# Patient Record
Sex: Female | Born: 1985 | Race: White | Hispanic: No | Marital: Single | Smoking: Former smoker
Health system: Southern US, Community
[De-identification: ages and names within clinical notes are randomized; demographics above are authoritative.]

## PROBLEM LIST (undated history)

## (undated) ENCOUNTER — Inpatient Hospital Stay (HOSPITAL_COMMUNITY): Payer: Self-pay

## (undated) DIAGNOSIS — I340 Nonrheumatic mitral (valve) insufficiency: Secondary | ICD-10-CM

## (undated) DIAGNOSIS — F329 Major depressive disorder, single episode, unspecified: Secondary | ICD-10-CM

## (undated) DIAGNOSIS — J45909 Unspecified asthma, uncomplicated: Secondary | ICD-10-CM

## (undated) DIAGNOSIS — F32A Depression, unspecified: Secondary | ICD-10-CM

## (undated) DIAGNOSIS — R001 Bradycardia, unspecified: Secondary | ICD-10-CM

## (undated) DIAGNOSIS — B029 Zoster without complications: Secondary | ICD-10-CM

## (undated) DIAGNOSIS — O24419 Gestational diabetes mellitus in pregnancy, unspecified control: Secondary | ICD-10-CM

## (undated) DIAGNOSIS — R42 Dizziness and giddiness: Secondary | ICD-10-CM

## (undated) DIAGNOSIS — F419 Anxiety disorder, unspecified: Secondary | ICD-10-CM

## (undated) DIAGNOSIS — F909 Attention-deficit hyperactivity disorder, unspecified type: Secondary | ICD-10-CM

## (undated) DIAGNOSIS — E119 Type 2 diabetes mellitus without complications: Secondary | ICD-10-CM

## (undated) HISTORY — PX: APPENDECTOMY: SHX54

## (undated) HISTORY — DX: Depression, unspecified: F32.A

## (undated) HISTORY — DX: Major depressive disorder, single episode, unspecified: F32.9

## (undated) HISTORY — DX: Bradycardia, unspecified: R00.1

## (undated) HISTORY — PX: FOOT SURGERY: SHX648

## (undated) HISTORY — DX: Dizziness and giddiness: R42

## (undated) HISTORY — DX: Anxiety disorder, unspecified: F41.9

## (undated) HISTORY — DX: Attention-deficit hyperactivity disorder, unspecified type: F90.9

---
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
## 2005-06-22 ENCOUNTER — Emergency Department (HOSPITAL_COMMUNITY): Admission: EM | Admit: 2005-06-22 | Discharge: 2005-06-22 | Payer: Self-pay | Admitting: Family Medicine

## 2005-06-24 ENCOUNTER — Emergency Department (HOSPITAL_COMMUNITY): Admission: EM | Admit: 2005-06-24 | Discharge: 2005-06-24 | Payer: Self-pay | Admitting: Family Medicine

## 2005-10-12 ENCOUNTER — Emergency Department (HOSPITAL_COMMUNITY): Admission: EM | Admit: 2005-10-12 | Discharge: 2005-10-12 | Payer: Self-pay | Admitting: Family Medicine

## 2006-01-04 ENCOUNTER — Emergency Department (HOSPITAL_COMMUNITY): Admission: EM | Admit: 2006-01-04 | Discharge: 2006-01-04 | Payer: Self-pay | Admitting: Family Medicine

## 2006-02-27 ENCOUNTER — Emergency Department (HOSPITAL_COMMUNITY): Admission: EM | Admit: 2006-02-27 | Discharge: 2006-02-27 | Payer: Self-pay | Admitting: Emergency Medicine

## 2006-03-11 ENCOUNTER — Emergency Department (HOSPITAL_COMMUNITY): Admission: EM | Admit: 2006-03-11 | Discharge: 2006-03-11 | Payer: Self-pay | Admitting: Emergency Medicine

## 2006-04-17 ENCOUNTER — Emergency Department (HOSPITAL_COMMUNITY): Admission: EM | Admit: 2006-04-17 | Discharge: 2006-04-17 | Payer: Self-pay | Admitting: Emergency Medicine

## 2006-06-15 ENCOUNTER — Inpatient Hospital Stay (HOSPITAL_COMMUNITY): Admission: AD | Admit: 2006-06-15 | Discharge: 2006-06-16 | Payer: Self-pay | Admitting: Obstetrics & Gynecology

## 2006-06-19 ENCOUNTER — Inpatient Hospital Stay (HOSPITAL_COMMUNITY): Admission: AD | Admit: 2006-06-19 | Discharge: 2006-06-19 | Payer: Self-pay | Admitting: Obstetrics and Gynecology

## 2006-10-28 ENCOUNTER — Inpatient Hospital Stay (HOSPITAL_COMMUNITY): Admission: AD | Admit: 2006-10-28 | Discharge: 2006-10-28 | Payer: Self-pay | Admitting: Obstetrics and Gynecology

## 2006-11-27 ENCOUNTER — Encounter: Admission: RE | Admit: 2006-11-27 | Discharge: 2006-11-27 | Payer: Self-pay | Admitting: Obstetrics and Gynecology

## 2007-01-14 ENCOUNTER — Inpatient Hospital Stay (HOSPITAL_COMMUNITY): Admission: AD | Admit: 2007-01-14 | Discharge: 2007-01-18 | Payer: Self-pay | Admitting: Obstetrics and Gynecology

## 2007-01-22 ENCOUNTER — Inpatient Hospital Stay (HOSPITAL_COMMUNITY): Admission: AD | Admit: 2007-01-22 | Discharge: 2007-01-24 | Payer: Self-pay | Admitting: Obstetrics and Gynecology

## 2007-01-25 ENCOUNTER — Ambulatory Visit (HOSPITAL_COMMUNITY): Admission: RE | Admit: 2007-01-25 | Discharge: 2007-01-25 | Payer: Self-pay | Admitting: Obstetrics and Gynecology

## 2007-02-16 ENCOUNTER — Inpatient Hospital Stay (HOSPITAL_COMMUNITY): Admission: AD | Admit: 2007-02-16 | Discharge: 2007-02-16 | Payer: Self-pay | Admitting: Obstetrics and Gynecology

## 2007-03-21 ENCOUNTER — Inpatient Hospital Stay (HOSPITAL_COMMUNITY): Admission: AD | Admit: 2007-03-21 | Discharge: 2007-03-21 | Payer: Self-pay | Admitting: Obstetrics and Gynecology

## 2007-11-19 ENCOUNTER — Inpatient Hospital Stay (HOSPITAL_COMMUNITY): Admission: AD | Admit: 2007-11-19 | Discharge: 2007-11-19 | Payer: Self-pay | Admitting: Obstetrics and Gynecology

## 2007-12-19 ENCOUNTER — Emergency Department (HOSPITAL_COMMUNITY): Admission: EM | Admit: 2007-12-19 | Discharge: 2007-12-19 | Payer: Self-pay | Admitting: Emergency Medicine

## 2008-01-29 ENCOUNTER — Emergency Department (HOSPITAL_COMMUNITY): Admission: EM | Admit: 2008-01-29 | Discharge: 2008-01-29 | Payer: Self-pay | Admitting: Family Medicine

## 2008-03-27 ENCOUNTER — Inpatient Hospital Stay (HOSPITAL_COMMUNITY): Admission: AD | Admit: 2008-03-27 | Discharge: 2008-03-27 | Payer: Self-pay | Admitting: Family Medicine

## 2008-04-01 ENCOUNTER — Emergency Department (HOSPITAL_COMMUNITY): Admission: EM | Admit: 2008-04-01 | Discharge: 2008-04-01 | Payer: Self-pay | Admitting: Emergency Medicine

## 2008-07-11 ENCOUNTER — Emergency Department (HOSPITAL_COMMUNITY): Admission: EM | Admit: 2008-07-11 | Discharge: 2008-07-11 | Payer: Self-pay | Admitting: Emergency Medicine

## 2009-02-19 ENCOUNTER — Emergency Department (HOSPITAL_COMMUNITY): Admission: EM | Admit: 2009-02-19 | Discharge: 2009-02-19 | Payer: Self-pay | Admitting: Emergency Medicine

## 2010-02-06 ENCOUNTER — Emergency Department (HOSPITAL_COMMUNITY): Admission: EM | Admit: 2010-02-06 | Discharge: 2010-02-06 | Payer: Self-pay | Admitting: Emergency Medicine

## 2010-05-04 ENCOUNTER — Emergency Department (HOSPITAL_COMMUNITY)
Admission: EM | Admit: 2010-05-04 | Discharge: 2010-05-04 | Payer: Self-pay | Source: Home / Self Care | Admitting: Emergency Medicine

## 2010-08-18 LAB — BASIC METABOLIC PANEL
CO2: 26 mEq/L (ref 19–32)
Chloride: 104 mEq/L (ref 96–112)
GFR calc Af Amer: >60 mL/min (ref >60)
Potassium: 3.6 mEq/L (ref 3.5–5.1)

## 2010-08-18 LAB — URINALYSIS, ROUTINE W REFLEX MICROSCOPIC
Glucose, UA: NEGATIVE mg/dL
Hgb urine dipstick: NEGATIVE
Ketones, ur: NEGATIVE mg/dL
Protein, ur: NEGATIVE mg/dL
Urobilinogen, UA: 0.2 mg/dL (ref 0.0–1.0)

## 2010-08-18 LAB — HEMOCCULT GUIAC POC 1CARD (OFFICE): Fecal Occult Bld: POSITIVE

## 2010-08-18 LAB — CBC
HCT: 42 % (ref 36.0–46.0)
Hemoglobin: 14.3 g/dL (ref 12.0–15.0)
MCV: 92.3 fL (ref 78.0–100.0)
RBC: 4.55 MIL/uL (ref 3.87–5.11)
WBC: 10.7 10*3/uL — ABNORMAL HIGH (ref 4.0–10.5)

## 2010-08-18 LAB — DIFFERENTIAL
Eosinophils Absolute: 0.1 10*3/uL (ref 0.0–0.7)
Eosinophils Relative: 1 % (ref 0–5)
Lymphs Abs: 2.6 10*3/uL (ref 0.7–4.0)
Monocytes Absolute: 0.3 10*3/uL (ref 0.1–1.0)
Monocytes Relative: 2 % — ABNORMAL LOW (ref 3–12)

## 2010-08-18 LAB — POCT PREGNANCY, URINE: Preg Test, Ur: NEGATIVE

## 2010-08-18 LAB — URINE MICROSCOPIC-ADD ON

## 2010-09-27 NOTE — Discharge Summary (Signed)
Jamie Palmer, Jamie Palmer              ACCOUNT NO.:  [id number]   MEDICAL RECORD NO.:  [id number]          PATIENT TYPE:  INP   LOCATION:  9306                          FACILITY:  [facility]   PHYSICIAN:  Guy Sandifer. Henderson Cloud, M.D. DATE OF BIRTH:  04-May-1986   DATE OF ADMISSION:  01/22/2007  DATE OF DISCHARGE:  01/24/2007                               DISCHARGE SUMMARY   ADMITTING DIAGNOSIS:  1. Postpartum pneumonia.  2. Possible urinary tract infection.   DISCHARGE DIAGNOSIS:  Postpartum pneumonia.   REASON FOR ADMISSION:  This patient is a 25 year old, 6 days out from a  vaginal delivery.  She is a smoker.  She presents to the office with  productive cough, wheezing, and shortness of breath.  During evaluation  x-rays were consistent with a lingular infiltrate consistent with  pneumonia.  She is admitted for further treatment.   HOSPITAL COURSE:  The patient was admitted to the hospital where she  received IV fluids, IV antibiotics, and albuterol respiratory  treatments.  On admission her white count 11.8 and hemoglobin is 11.4.  On January 24, 2007, white count is 9.1 and hemoglobin is 10.9.  her  O2 saturations are 97%.  She is feeling much better with much less  coughing; and is feeling good.  She wants to go home.   PHYSICAL EXAMINATION:  On examination she just has a single wheeze of  the left base which clears with deep inspiration.   CONDITION ON DISCHARGE:  Good.   DIET:  Regular as tolerated.   ACTIVITY:  No lifting, operation of automobiles, no vaginal entry.  She  is to call the office for problems including but not limited to  increasing shortness of breath, temperature of 101 degrees, persistent  nausea, vomiting, or increasing pain.   MEDICATIONS:  1. Zithromax pack.  2. Albuterol inhaler 2 puffs q.6 h.   FOLLOWUP:  A chest x-ray tomorrow morning followed by an office visit  tomorrow morning.      Guy Sandifer Henderson Cloud, M.D.  Electronically Signed    JET/MEDQ  D:   01/24/2007  T:  01/24/2007  Job:  [id number]

## 2010-09-27 NOTE — H&P (Signed)
Jamie Palmer, Jamie Palmer              ACCOUNT NO.:  [id number]   MEDICAL RECORD NO.:  [id number]          PATIENT TYPE:  INP   LOCATION:  9306                          FACILITY:  [facility]   PHYSICIAN:  Duke Salvia. Marcelle Overlie, M.D.DATE OF BIRTH:  06-30-1985   DATE OF ADMISSION:  01/22/2007  DATE OF DISCHARGE:                              HISTORY & PHYSICAL   CHIEF COMPLAINT:  Post partum, wheezing, upper respiratory infection.   HISTORY OF PRESENT ILLNESS:  A 25 year old who is post partum day 6 from  a vaginal delivery. She has a history of smoking. Called the office  earlier today saying she was having cough that was productive with some  wheezing, was not feeling well and presented to MAU for evaluation.   In MAU, chest x-ray was obtained. It showed a lingular infiltrate  consistent with pneumonia. Her exam showed she was having some deep lung  wheezing. No history of asthma. Did receive a nebulizer treatment in MAU  and was admitted for IV antibiotics.   PAST MEDICAL HISTORY:  ALLERGIES:  None.  PRIOR SURGERY:  Appendectomy.   FAMILY HISTORY:  Significant for a mother with lung cancer, father who  has nasal cancer and also a family history of hypertension.   PHYSICAL EXAMINATION:  Temperature 99, respirations 24, BP 120/78.  HEENT:  Unremarkable.  NECK:  Supple without masses.  LUNGS:  Reveal some wheezing at the bases.  CARDIOVASCULAR:  Regular rate and rhythm without murmurs, rubs, or  gallops noted.  ABDOMINAL EXAM:  Fundus was firm. Abdominal exam was otherwise  unremarkable.  PELVIC EXAM:  Deferred.   Chest x-ray consistent with lingular pneumonia. Urine also was showing  some possibility of UTI.   IMPRESSION:  1. Post-partum pneumonia.  2. Possible urinary tract infection.  3. History of smoking.   PLAN:  Will admit for nebulizer treatments, IV antibiotics pending  sputum and urine cultures.     Richard M. Marcelle Overlie, M.D.  Electronically Signed    RMH/MEDQ  D:   01/23/2007  T:  01/23/2007  Job:  [id number]

## 2010-10-20 ENCOUNTER — Inpatient Hospital Stay (HOSPITAL_COMMUNITY)
Admission: AD | Admit: 2010-10-20 | Discharge: 2010-10-20 | Disposition: A | Discharge status: Home / Self Care | Payer: Medicaid Other | Source: Ambulatory Visit | Attending: Obstetrics & Gynecology | Admitting: Obstetrics & Gynecology

## 2010-10-20 ENCOUNTER — Inpatient Hospital Stay (INDEPENDENT_AMBULATORY_CARE_PROVIDER_SITE_OTHER)
Admission: RE | Admit: 2010-10-20 | Discharge: 2010-10-20 | Disposition: A | Discharge status: Home / Self Care | Payer: Self-pay | Source: Ambulatory Visit | Attending: Emergency Medicine | Admitting: Emergency Medicine

## 2010-10-20 ENCOUNTER — Inpatient Hospital Stay (HOSPITAL_COMMUNITY): Payer: Medicaid Other

## 2010-10-20 DIAGNOSIS — R109 Unspecified abdominal pain: Secondary | ICD-10-CM | POA: Insufficient documentation

## 2010-10-20 DIAGNOSIS — N83209 Unspecified ovarian cyst, unspecified side: Secondary | ICD-10-CM

## 2010-10-20 DIAGNOSIS — N949 Unspecified condition associated with female genital organs and menstrual cycle: Secondary | ICD-10-CM

## 2010-10-20 LAB — CBC
Hemoglobin: 13.8 g/dL (ref 12.0–15.0)
MCHC: 34 g/dL (ref 30.0–36.0)
RDW: 13 % (ref 11.5–15.5)
WBC: 11.3 10*3/uL — ABNORMAL HIGH (ref 4.0–10.5)

## 2010-10-20 LAB — POCT URINALYSIS DIP (DEVICE)
Ketones, ur: NEGATIVE mg/dL
Protein, ur: NEGATIVE mg/dL
Specific Gravity, Urine: 1.025 (ref 1.005–1.030)

## 2010-10-20 LAB — POCT PREGNANCY, URINE: Preg Test, Ur: NEGATIVE

## 2010-10-20 LAB — WET PREP, GENITAL

## 2010-10-21 LAB — GC/CHLAMYDIA PROBE AMP, GENITAL
Chlamydia, DNA Probe: NEGATIVE
GC Probe Amp, Genital: NEGATIVE

## 2010-12-14 ENCOUNTER — Inpatient Hospital Stay (INDEPENDENT_AMBULATORY_CARE_PROVIDER_SITE_OTHER)
Admission: RE | Admit: 2010-12-14 | Discharge: 2010-12-14 | Disposition: A | Discharge status: Home / Self Care | Payer: Medicaid Other | Source: Ambulatory Visit | Attending: Family Medicine | Admitting: Family Medicine

## 2010-12-14 DIAGNOSIS — R112 Nausea with vomiting, unspecified: Secondary | ICD-10-CM

## 2010-12-14 LAB — POCT URINALYSIS DIP (DEVICE)
Ketones, ur: NEGATIVE mg/dL
Protein, ur: NEGATIVE mg/dL
Urobilinogen, UA: 2 mg/dL — ABNORMAL HIGH (ref 0.0–1.0)

## 2010-12-14 LAB — POCT PREGNANCY, URINE: Preg Test, Ur: NEGATIVE

## 2011-01-17 ENCOUNTER — Ambulatory Visit
Admission: RE | Admit: 2011-01-17 | Discharge: 2011-01-17 | Disposition: A | Discharge status: Home / Self Care | Payer: Medicaid Other | Source: Ambulatory Visit | Attending: Specialist | Admitting: Specialist

## 2011-01-17 ENCOUNTER — Other Ambulatory Visit: Payer: Self-pay | Admitting: Specialist

## 2011-01-17 DIAGNOSIS — M542 Cervicalgia: Secondary | ICD-10-CM

## 2011-01-17 DIAGNOSIS — M549 Dorsalgia, unspecified: Secondary | ICD-10-CM

## 2011-02-09 LAB — GC/CHLAMYDIA PROBE AMP, GENITAL: Chlamydia, DNA Probe: NEGATIVE

## 2011-02-09 LAB — URINALYSIS, ROUTINE W REFLEX MICROSCOPIC
Bilirubin Urine: NEGATIVE
Glucose, UA: NEGATIVE
Ketones, ur: NEGATIVE
Nitrite: NEGATIVE
Specific Gravity, Urine: 1.025
pH: 5.5

## 2011-02-09 LAB — WET PREP, GENITAL

## 2011-02-14 LAB — URINALYSIS, ROUTINE W REFLEX MICROSCOPIC
Bilirubin Urine: NEGATIVE
Glucose, UA: NEGATIVE
Nitrite: NEGATIVE
Specific Gravity, Urine: 1.015
pH: 7

## 2011-02-14 LAB — WET PREP, GENITAL: Yeast Wet Prep HPF POC: NONE SEEN

## 2011-02-14 LAB — GC/CHLAMYDIA PROBE AMP, GENITAL
Chlamydia, DNA Probe: NEGATIVE
GC Probe Amp, Genital: NEGATIVE

## 2011-02-21 LAB — URINALYSIS, ROUTINE W REFLEX MICROSCOPIC
Bilirubin Urine: NEGATIVE
Hgb urine dipstick: NEGATIVE
Specific Gravity, Urine: 1.02
pH: 6

## 2011-02-23 LAB — URINE CULTURE

## 2011-02-23 LAB — URINE MICROSCOPIC-ADD ON

## 2011-02-23 LAB — URINALYSIS, ROUTINE W REFLEX MICROSCOPIC
Glucose, UA: NEGATIVE
Specific Gravity, Urine: 1.025
pH: 5.5

## 2011-02-24 LAB — CBC
HCT: 33 — ABNORMAL LOW
HCT: 34.3 — ABNORMAL LOW
Hemoglobin: 14
MCHC: 35.2
MCV: 91.4
MCV: 91.4
MCV: 91.5
MCV: 92.1
Platelets: 220
Platelets: 299
Platelets: 306
RBC: 4.34
RDW: 13.9
RDW: 14
WBC: 18.6 — ABNORMAL HIGH
WBC: 9.1

## 2011-02-24 LAB — URINE MICROSCOPIC-ADD ON

## 2011-02-24 LAB — URINALYSIS, ROUTINE W REFLEX MICROSCOPIC
Glucose, UA: NEGATIVE
Specific Gravity, Urine: 1.01
pH: 6

## 2011-02-24 LAB — URINE CULTURE: Colony Count: 50000

## 2011-02-24 LAB — DIFFERENTIAL
Basophils Absolute: 0
Eosinophils Absolute: 0.1
Lymphocytes Relative: 20
Lymphs Abs: 1.8
Neutrophils Relative %: 72

## 2011-02-24 LAB — COMPREHENSIVE METABOLIC PANEL
Albumin: 2.7 — ABNORMAL LOW
BUN: 9
Creatinine, Ser: 0.66
Potassium: 3.9
Total Protein: 5.7 — ABNORMAL LOW

## 2011-02-24 LAB — CULTURE, RESPIRATORY W GRAM STAIN: Gram Stain: NONE SEEN

## 2011-03-01 LAB — URINALYSIS, ROUTINE W REFLEX MICROSCOPIC
Glucose, UA: 500 — AB
Nitrite: NEGATIVE
Protein, ur: NEGATIVE

## 2011-05-16 NOTE — L&D Delivery Note (Signed)
Delivery Note Pt progressed into active labor, went quickly at the end and pushed well.  At 5:25 PM a viable female was delivered via Vaginal, Spontaneous Delivery (Presentation: Right Occiput Anterior).  APGAR: 9, 9; weight pending.   Placenta status: Intact, Spontaneous.  Cord: 3 vessels with the following complications: None.  Anesthesia: Epidural  Episiotomy: None Lacerations: 1st degree Suture Repair: 3.0 vicryl rapide Est. Blood Loss (mL): 400  Mom to postpartum.  Baby to stay with mom.  Horrace Hanak D 03/29/2012, 5:45 PM

## 2011-05-17 ENCOUNTER — Emergency Department (HOSPITAL_COMMUNITY)
Admission: EM | Admit: 2011-05-17 | Discharge: 2011-05-17 | Disposition: A | Discharge status: Home / Self Care | Payer: Medicaid Other | Source: Home / Self Care

## 2011-05-17 DIAGNOSIS — N760 Acute vaginitis: Secondary | ICD-10-CM

## 2011-05-17 DIAGNOSIS — A6000 Herpesviral infection of urogenital system, unspecified: Secondary | ICD-10-CM

## 2011-05-17 LAB — WET PREP, GENITAL
Trich, Wet Prep: NONE SEEN
Yeast Wet Prep HPF POC: NONE SEEN

## 2011-05-17 MED ORDER — ACYCLOVIR 5 % EX OINT: TOPICAL_OINTMENT | CUTANEOUS | Status: DC

## 2011-05-17 MED ORDER — VALACYCLOVIR HCL 1 G PO TABS: 1000.0000 mg | ORAL_TABLET | Freq: Two times a day (BID) | ORAL | Status: AC

## 2011-05-17 NOTE — ED Notes (Signed)
Reportedly monogamous female who is in relationship w female works out of town a lot, has been having vaginal itching and yellow secretions for past couple of days; concerned about yeast infection; "feel like I ave been cut down there"

## 2011-05-17 NOTE — ED Provider Notes (Signed)
History     CSN: [id number]  Arrival date & time 05/17/11  1029   None     Chief Complaint  Patient presents with  . Vaginal Discharge    (Consider location/radiation/quality/duration/timing/severity/associated sxs/prior treatment) HPI Comments: Pt presents with c/o vaginal discharge and genital pain - onset 2 days ago. States feels very raw and sore at vaginal opening. No previous hx of similar genital lesions or prior dx of STD. She has been monogamous in her relationship of 7 yrs. Pt states she discontinued using Nuva Ring 2 mos ago trying to conceive. She has not tried anything for her symptoms. No pelvic pain or fever.    History reviewed. No pertinent past medical history.  Past Surgical History  Procedure Date  . Appendectomy     History reviewed. No pertinent family history.  History  Substance Use Topics  . Smoking status: Current Everyday Smoker  . Smokeless tobacco: Not on file  . Alcohol Use: No    OB History    Grav Para Term Preterm Abortions TAB SAB Ect Mult Living                  Review of Systems  Constitutional: Negative for fever and chills.  Genitourinary: Positive for vaginal discharge and vaginal pain. Negative for dysuria, frequency, vaginal bleeding and pelvic pain.    Allergies  Review of patient's allergies indicates no known allergies.  Home Medications   Current Outpatient Rx  Name Route Sig Dispense Refill  . ALPRAZOLAM 1 MG PO TABS Oral Take 1 mg by mouth at bedtime as needed.      . ACYCLOVIR 5 % EX OINT  Apply externally 4-5 times a day as needed 15 g 0  . CITALOPRAM HYDROBROMIDE 10 MG PO TABS Oral Take 10 mg by mouth daily.      Marland Kitchen VALACYCLOVIR HCL 1 G PO TABS Oral Take 1 tablet (1,000 mg total) by mouth 2 (two) times daily. 14 tablet 0    BP 114/56  Pulse 69  Temp(Src) 98.4 F (36.9 C) (Oral)  Resp 16  SpO2 100%  Physical Exam  Nursing note and vitals reviewed. Constitutional: She appears well-developed and  well-nourished. No distress.  Abdominal: Soft. She exhibits no mass. There is no tenderness.  Genitourinary: Uterus normal.    There is lesion on the right labia. There is no rash or tenderness on the right labia. There is no rash, tenderness or lesion on the left labia. Cervix exhibits no motion tenderness, no discharge and no friability. Right adnexum displays no mass, no tenderness and no fullness. Left adnexum displays no mass, no tenderness and no fullness. No erythema, tenderness or bleeding around the vagina. No foreign body around the vagina. No signs of injury around the vagina. Vaginal discharge found.  Neurological: She is alert.  Skin: Skin is warm and dry.  Psychiatric: She has a normal mood and affect.    ED Course  Procedures (including critical care time)   Labs Reviewed  GC/CHLAMYDIA PROBE AMP, GENITAL  WET PREP, GENITAL  HERPES SIMPLEX VIRUS CULTURE   No results found.   1. Genital herpes   2. Vaginitis       MDM  Shallow ulcers noted at vaginal introitus, typical appearance of HSV. Discussed dx and treatment with pt. Await vag cx. results.        Melody Comas, [place] 05/17/11 1342

## 2011-05-17 NOTE — Discharge Instructions (Signed)
You will be notified if your vaginal cultures are abnormal. Your herpes culture will take approximately one week for the results to be available. False negative results are possible, meaning that even if your culture is negative you may still have genital herpes. If your culture is negative I recommend you have a herpes blood test done by your primary care dr. No sexual activity until your sores have healed. I have prescribed an ointment you can apply externally to cover the area of the sores to help with your discomfort. Do not apply any other ointments or creams.  You may also take Tylenol or Ibuprofen for discomfort.

## 2011-05-18 LAB — GC/CHLAMYDIA PROBE AMP, GENITAL: Chlamydia, DNA Probe: NEGATIVE

## 2011-05-19 LAB — HERPES SIMPLEX VIRUS CULTURE

## 2011-05-20 NOTE — ED Provider Notes (Signed)
Medical screening examination/treatment/procedure(s) were performed by non-physician practitioner and as supervising physician I was immediately available for consultation/collaboration.  Ester Mabe M. MD   Danniel Grenz M Baby Stairs, MD 05/20/11 1826 

## 2011-08-31 LAB — OB RESULTS CONSOLE ABO/RH

## 2011-08-31 LAB — OB RESULTS CONSOLE GC/CHLAMYDIA
Chlamydia: NEGATIVE
Gonorrhea: NEGATIVE

## 2011-08-31 LAB — OB RESULTS CONSOLE HEPATITIS B SURFACE ANTIGEN: Hepatitis B Surface Ag: NEGATIVE

## 2011-08-31 LAB — OB RESULTS CONSOLE ANTIBODY SCREEN: Antibody Screen: NEGATIVE

## 2011-08-31 LAB — OB RESULTS CONSOLE RUBELLA ANTIBODY, IGM: Rubella: IMMUNE

## 2011-09-29 ENCOUNTER — Encounter (HOSPITAL_COMMUNITY): Payer: Self-pay | Admitting: Emergency Medicine

## 2011-09-29 ENCOUNTER — Emergency Department (HOSPITAL_COMMUNITY)
Admission: EM | Admit: 2011-09-29 | Discharge: 2011-09-29 | Disposition: A | Discharge status: Home / Self Care | Payer: Medicaid Other | Source: Home / Self Care | Attending: Family Medicine | Admitting: Family Medicine

## 2011-09-29 DIAGNOSIS — J309 Allergic rhinitis, unspecified: Secondary | ICD-10-CM

## 2011-09-29 DIAGNOSIS — J302 Other seasonal allergic rhinitis: Secondary | ICD-10-CM

## 2011-09-29 NOTE — Discharge Instructions (Signed)
Use benadryl, mucinex and lots of fluids, no more smoking, see your doctor as needed.

## 2011-09-29 NOTE — ED Provider Notes (Signed)
History     CSN: [id number]  Arrival date & time 09/29/11  1110   First MD Initiated Contact with Patient 09/29/11 1136      Chief Complaint  Patient presents with  . Nasal Congestion    (Consider location/radiation/quality/duration/timing/severity/associated sxs/prior treatment) Patient is a 26 y.o. female presenting with pharyngitis. The history is provided by the patient and a parent.  Sore Throat This is a new problem. The current episode started more than 2 days ago. The problem has not changed since onset.Associated symptoms comments: Pt is smoker, pregnant, no fever.Marland Kitchen    History reviewed. No pertinent past medical history.  Past Surgical History  Procedure Date  . Appendectomy     History reviewed. No pertinent family history.  History  Substance Use Topics  . Smoking status: Current Everyday Smoker -- 0.2 packs/day    Types: Cigarettes  . Smokeless tobacco: Not on file  . Alcohol Use: No    OB History    Grav Para Term Preterm Abortions TAB SAB Ect Mult Living   1               Review of Systems  Constitutional: Negative.   HENT: Positive for congestion, sore throat, rhinorrhea, sneezing and postnasal drip.   Respiratory: Negative.   Cardiovascular: Negative.   Gastrointestinal: Negative.   Skin: Negative.     Allergies  Review of patient's allergies indicates no known allergies.  Home Medications   Current Outpatient Rx  Name Route Sig Dispense Refill  . CITALOPRAM HYDROBROMIDE 10 MG PO TABS Oral Take 10 mg by mouth daily.      Marland Kitchen PRENATAL MULTIVITAMIN CH Oral Take 1 tablet by mouth daily.    . ACYCLOVIR 5 % EX OINT  Apply externally 4-5 times a day as needed 15 g 0  . ALPRAZOLAM 1 MG PO TABS Oral Take 1 mg by mouth at bedtime as needed.        BP 113/67  Pulse 73  Temp(Src) 99.1 F (37.3 C) (Oral)  Resp 18  SpO2 100%  Physical Exam  Nursing note and vitals reviewed. Constitutional: She is oriented to person, place, and time. She  appears well-developed and well-nourished.  HENT:  Head: Normocephalic.  Right Ear: External ear normal.  Left Ear: External ear normal.  Nose: Mucosal edema and rhinorrhea present.  Mouth/Throat: Oropharynx is clear and moist.  Eyes: Pupils are equal, round, and reactive to light.  Neck: Normal range of motion. Neck supple.  Cardiovascular: Normal heart sounds.   Pulmonary/Chest: Breath sounds normal.  Lymphadenopathy:    She has no cervical adenopathy.  Neurological: She is alert and oriented to person, place, and time.  Skin: Skin is warm and dry.    ED Course  Procedures (including critical care time)  Labs Reviewed - No data to display No results found.   1. Seasonal allergic rhinitis       MDM          Linna Hoff, MD 09/29/11 1219

## 2011-09-29 NOTE — ED Notes (Signed)
Pt having nasal congestion for 4 days, had onset of sore throat 2 days ago. Pt has not take OTC meds, due to pregnancy she is unsure what she can take. Pt has a cough and states her throat is so dry she feels like it is going to close up.

## 2011-10-04 ENCOUNTER — Emergency Department (HOSPITAL_COMMUNITY)
Admission: EM | Admit: 2011-10-04 | Discharge: 2011-10-04 | Disposition: A | Discharge status: Home / Self Care | Payer: Medicaid Other | Source: Home / Self Care | Attending: Emergency Medicine | Admitting: Emergency Medicine

## 2011-10-04 DIAGNOSIS — J329 Chronic sinusitis, unspecified: Secondary | ICD-10-CM

## 2011-10-04 NOTE — ED Provider Notes (Signed)
Medical screening examination/treatment/procedure(s) were performed by non-physician practitioner and as supervising physician I was immediately available for consultation/collaboration.  Coti Burd   Dyon Rotert, MD 10/04/11 1252 

## 2011-10-04 NOTE — Discharge Instructions (Signed)
Medication safe in pregnancy:  COLD / ALLERGY:  Any Tylenol products or generic equivalent - read box for symptoms, Drixoral, Triaminic, Chlortrimeton, Benedryl, Sudafed (Pseudoephedrine), Dimetapp, Zyrtec, Claritin, Claritin D, Zicam, Breathe Right Strips, Vicks Vapor Rub, nasal sprays (Afrin, Dristan, 4-Way, saline), humidifier  NO DECONGESTANTS FOR THOSE PATIENTS ON TERBUTALINE  COUGH / SORE THROAT:  Limited use (3-5 days): Robitussin DM (Dextromethorphan, Guafenisin), Mucinex, Delsym.  Chloraseptic spray, Cough drops/lozenges (Brompheneramine, Chlorpheneramine, Diphenhydramine, Pseudoephedrine), salt water gargles   Sinusitis Sinuses are air pockets within the bones of your face. The growth of bacteria within a sinus leads to infection. The infection prevents the sinuses from draining. This infection is called sinusitis. SYMPTOMS  There will be different areas of pain depending on which sinuses have become infected.  The maxillary sinuses often produce pain beneath the eyes.   Frontal sinusitis may cause pain in the middle of the forehead and above the eyes.  Other problems (symptoms) include:  Toothaches.   Colored, pus-like (purulent) drainage from the nose.   Swelling, warmth, and tenderness over the sinus areas may be signs of infection.  TREATMENT  Sinusitis is most often determined by an exam.X-rays may be taken. If x-rays have been taken, make sure you obtain your results or find out how you are to obtain them. Your caregiver may give you medications (antibiotics). These are medications that will help kill the bacteria causing the infection. You may also be given a medication (decongestant) that helps to reduce sinus swelling.  HOME CARE INSTRUCTIONS   Only take over-the-counter or prescription medicines for pain, discomfort, or fever as directed by your caregiver.   Drink extra fluids. Fluids help thin the mucus so your sinuses can drain more easily.   Applying either moist  heat or ice packs to the sinus areas may help relieve discomfort.   Use saline nasal sprays to help moisten your sinuses. The sprays can be found at your local drugstore.  SEEK IMMEDIATE MEDICAL CARE IF:  You have a fever.   You have increasing pain, severe headaches, or toothache.   You have nausea, vomiting, or drowsiness.   You develop unusual swelling around the face or trouble seeing.  MAKE SURE YOU:   Understand these instructions.   Will watch your condition.   Will get help right away if you are not doing well or get worse.  Document Released: 05/01/2005 Document Revised: 04/20/2011 Document Reviewed: 11/28/2006 [facility] Patient Information 2012 [place], [place].

## 2011-10-04 NOTE — ED Provider Notes (Signed)
History     CSN: [id number]  Arrival date & time 10/04/11  1005   First MD Initiated Contact with Patient 10/04/11 1034      11:59 AM  HPI Patient reports one week of sinus congestion, rhinorrhea, sinus pressure, cough. Was diagnosed with allergies and prescribed Sudafed. States if that has not been helping symptoms. Patient has significant history of being [redacted] weeks pregnant.  Patient is a 26 y.o. female presenting with cough.  Cough This is a new problem. The current episode started more than 1 week ago. The problem occurs constantly. The problem has been gradually worsening. The cough is productive of sputum. There has been no fever. Associated symptoms include rhinorrhea. Pertinent negatives include no chest pain, no chills, no ear congestion, no ear pain, no headaches, no sore throat, no myalgias, no shortness of breath and no wheezing. She has tried decongestants for the symptoms. The treatment provided no relief. She is a smoker. Her past medical history does not include bronchitis, pneumonia or asthma.    No past medical history on file.  Past Surgical History  Procedure Date  . Appendectomy     No family history on file.  History  Substance Use Topics  . Smoking status: Current Everyday Smoker -- 0.2 packs/day    Types: Cigarettes  . Smokeless tobacco: Not on file  . Alcohol Use: No    OB History    Grav Para Term Preterm Abortions TAB SAB Ect Mult Living   1               Review of Systems  Constitutional: Negative for fever and chills.  HENT: Positive for congestion, rhinorrhea, postnasal drip and sinus pressure. Negative for ear pain, sore throat, neck pain, neck stiffness and dental problem.   Respiratory: Positive for cough. Negative for shortness of breath and wheezing.   Cardiovascular: Negative for chest pain.  Gastrointestinal: Negative for nausea, vomiting, abdominal pain and diarrhea.  Musculoskeletal: Negative for myalgias.  Neurological: Negative  for headaches.  All other systems reviewed and are negative.    Allergies  Review of patient's allergies indicates no known allergies.  Home Medications   Current Outpatient Rx  Name Route Sig Dispense Refill  . CITALOPRAM HYDROBROMIDE 10 MG PO TABS Oral Take 10 mg by mouth daily.      Marland Kitchen PRENATAL MULTIVITAMIN CH Oral Take 1 tablet by mouth daily.    . SUDAFED 12 HOUR PO Oral Take by mouth.    . ACYCLOVIR 5 % EX OINT  Apply externally 4-5 times a day as needed 15 g 0  . ALPRAZOLAM 1 MG PO TABS Oral Take 1 mg by mouth at bedtime as needed.        BP 107/61  Pulse 92  Temp(Src) 98.9 F (37.2 C) (Oral)  Resp 18  SpO2 98%  Physical Exam  Vitals reviewed. Constitutional: She is oriented to person, place, and time. Vital signs are normal. She appears well-developed and well-nourished.  HENT:  Head: Normocephalic and atraumatic.  Right Ear: External ear normal.  Left Ear: External ear normal.  Nose: Rhinorrhea present. No mucosal edema. Right sinus exhibits no maxillary sinus tenderness and no frontal sinus tenderness. Left sinus exhibits no maxillary sinus tenderness and no frontal sinus tenderness.  Mouth/Throat: Oropharynx is clear and moist. No oropharyngeal exudate.  Eyes: Conjunctivae are normal. Pupils are equal, round, and reactive to light.  Neck: Normal range of motion. Neck supple.  Cardiovascular: Normal rate, regular rhythm and normal  heart sounds.  Exam reveals no friction rub.   No murmur heard. Pulmonary/Chest: Effort normal and breath sounds normal. She has no wheezes. She has no rhonchi. She has no rales. She exhibits no tenderness.  Musculoskeletal: Normal range of motion.  Neurological: She is alert and oriented to person, place, and time. Coordination normal.  Skin: Skin is warm and dry. No rash noted. No erythema. No pallor.    ED Course  Procedures   MDM  Provided patient with a list of cold allergy cough and sore throat meds that are safe in pregnancy.  Advised patient and likely symptoms are viral since she has lack of fever. Advised humidifier and return for worsening symptoms such as fever, chest pain, shortness of breath. Patient voices understanding and is ready for discharge  Thomasene Lot, Cordelia Poche 10/04/11 1203

## 2011-10-04 NOTE — ED Notes (Signed)
Pt with c/o sinus congestion with cough at night - headache - pt is pregnant [redacted] weeks pregnant - seen Friday dx: allergies taking sudafed without relief - coughing at night unable to sleep

## 2012-01-10 ENCOUNTER — Encounter: Payer: Medicaid Other | Attending: Obstetrics and Gynecology | Admitting: Dietician

## 2012-01-10 VITALS — Ht 66.5 in | Wt 220.5 lb

## 2012-01-10 DIAGNOSIS — Z713 Dietary counseling and surveillance: Secondary | ICD-10-CM | POA: Insufficient documentation

## 2012-01-10 DIAGNOSIS — O99810 Abnormal glucose complicating pregnancy: Secondary | ICD-10-CM | POA: Insufficient documentation

## 2012-01-10 DIAGNOSIS — O24419 Gestational diabetes mellitus in pregnancy, unspecified control: Secondary | ICD-10-CM

## 2012-01-15 ENCOUNTER — Encounter: Payer: Self-pay | Admitting: Dietician

## 2012-01-15 NOTE — Progress Notes (Signed)
  Patient was seen on 01/10/2012 for Gestational Diabetes self-management class at the [facility]. The following learning objectives were met by the patient during this course:   States the definition of Gestational Diabetes  States why dietary management is important in controlling blood glucose  Describes the effects each nutrient has on blood glucose levels  Demonstrates ability to create a balanced meal plan  Demonstrates carbohydrate counting   States when to check blood glucose levels  Demonstrates proper blood glucose monitoring techniques  States the effect of stress and exercise on blood glucose levels  States the importance of limiting caffeine and abstaining from alcohol and smoking  Blood glucose monitor given: Accu-Chek [place] monitoring kit Lot # [id number] Exp: 04/13/2013 Blood glucose reading: 120 at 6:00 PM  Patient instructed to monitor glucose levels: Fasting and 1 hour past each meal. FBS: 60 - <90 1 hour: <140   Patient received handouts:  Nutrition Diabetes and Pregnancy  Carbohydrate Counting List  Patient will be seen for follow-up as needed.

## 2012-03-16 ENCOUNTER — Inpatient Hospital Stay (HOSPITAL_COMMUNITY)
Admission: AD | Admit: 2012-03-16 | Discharge: 2012-03-16 | Disposition: A | Discharge status: Home / Self Care | Payer: Medicaid Other | Source: Ambulatory Visit | Attending: Obstetrics and Gynecology | Admitting: Obstetrics and Gynecology

## 2012-03-16 ENCOUNTER — Encounter (HOSPITAL_COMMUNITY): Payer: Self-pay | Admitting: *Deleted

## 2012-03-16 DIAGNOSIS — O99891 Other specified diseases and conditions complicating pregnancy: Secondary | ICD-10-CM | POA: Insufficient documentation

## 2012-03-16 HISTORY — DX: Gestational diabetes mellitus in pregnancy, unspecified control: O24.419

## 2012-03-16 LAB — POCT FERN TEST: POCT Fern Test: NEGATIVE

## 2012-03-16 LAB — WET PREP, GENITAL
Clue Cells Wet Prep HPF POC: NONE SEEN
Trich, Wet Prep: NONE SEEN
WBC, Wet Prep HPF POC: NONE SEEN

## 2012-03-16 NOTE — MAU Provider Note (Signed)
History     CSN: [id number]  Arrival date and time: 03/16/12 4098   First Provider Initiated Contact with Patient 03/16/12 2115      Chief Complaint  Patient presents with  . Rupture of Membranes   HPI  Pt is a G2P1001 at 37.3 wks IUP here with report of passing a yellowish discharge with odor; uncertain if water broke.  No report of regular contractions or vaginal bleeding.  +fetal movement.  Past Medical History  Diagnosis Date  . Depression   . Gestational diabetes     Past Surgical History  Procedure Date  . Appendectomy   . Foot surgery     Family History  Problem Relation Age of Onset  . Diabetes Other   . Hypertension Other   . Heart disease Other   . Cancer Other   . COPD Other   . Asthma Other   . Heart attack Other   . Obesity Other   . Hyperlipidemia Other   . Other Neg Hx     History  Substance Use Topics  . Smoking status: Current Every Day Smoker -- 0.2 packs/day    Types: Cigarettes  . Smokeless tobacco: Not on file  . Alcohol Use: No    Allergies: No Known Allergies  Prescriptions prior to admission  Medication Sig Dispense Refill  . glyBURIDE (DIABETA) 2.5 MG tablet Take 2.5 mg by mouth 2 (two) times daily with a meal.      . pantoprazole (PROTONIX) 40 MG tablet Take 40 mg by mouth daily.      . Prenatal Vit-Fe Fumarate-FA (PRENATAL MULTIVITAMIN) TABS Take 1 tablet by mouth daily.      . Pseudoephedrine HCl (SUDAFED 12 HOUR PO) Take by mouth.      . valACYclovir (VALTREX) 500 MG tablet Take 500 mg by mouth 2 (two) times daily. ? HSV is past, just as a precaution      . acyclovir ointment (ZOVIRAX) 5 % Apply externally 4-5 times a day as needed  15 g  0  . ALPRAZolam (XANAX) 1 MG tablet Take 1 mg by mouth at bedtime as needed.        . citalopram (CELEXA) 10 MG tablet Take 10 mg by mouth daily.          Review of Systems  Genitourinary:       Yellowish vaginal discharge  All other systems reviewed and are negative.   Physical  Exam   Blood pressure 126/72, pulse 88, temperature 97.9 F (36.6 C), temperature source Oral, resp. rate 20, height 5' 6.5" (1.689 m), weight 101.061 kg (222 lb 12.8 oz).  Physical Exam  Constitutional: She is oriented to person, place, and time. She appears well-developed and well-nourished. No distress.  HENT:  Head: Normocephalic.  Neck: Normal range of motion. Neck supple.  Cardiovascular: Normal rate, regular rhythm and normal heart sounds.   Respiratory: Effort normal and breath sounds normal.  GI: Soft. There is no tenderness.  Genitourinary: No bleeding around the vagina. Vaginal discharge (mucusy, urine odor smell) found.       Cervix 1-2/50/-3  Neurological: She is alert and oriented to person, place, and time.  Skin: Skin is warm and dry.  No pooling seen with speculum exam; fern negative  FHR 120's, +accels, reactive Toco - irregular MAU Course  Procedures  Results for orders placed during the hospital encounter of 03/16/12 (from the past 24 hour(s))  WET PREP, GENITAL     Status: Normal  Collection Time   03/16/12  8:20 PM      Component Value Range   Yeast Wet Prep HPF POC NONE SEEN  NONE SEEN   Trich, Wet Prep NONE SEEN  NONE SEEN   Clue Cells Wet Prep HPF POC NONE SEEN  NONE SEEN   WBC, Wet Prep HPF POC NONE SEEN  NONE SEEN     Assessment and Plan  Normal Vaginal Exam  Plan: DC to home Labor precautions  [facility] 03/16/2012, 9:24 PM

## 2012-03-16 NOTE — MAU Note (Signed)
Had some yellow stuff gush out and have some pains in abd and rectal pressure. Still feel fld leak at times. Has some odor. Have a cough

## 2012-03-16 NOTE — MAU Note (Signed)
Pt reports LOF since 1800. Pt states that the fluid is yellow and has a foul odor. Pt states that she is having irregular UC's, but nothing painful.

## 2012-03-21 ENCOUNTER — Telehealth (HOSPITAL_COMMUNITY): Payer: Self-pay | Admitting: *Deleted

## 2012-03-21 ENCOUNTER — Encounter (HOSPITAL_COMMUNITY): Payer: Self-pay | Admitting: *Deleted

## 2012-03-21 NOTE — Telephone Encounter (Signed)
Preadmission screen  

## 2012-03-29 ENCOUNTER — Encounter (HOSPITAL_COMMUNITY): Payer: Self-pay | Admitting: Anesthesiology

## 2012-03-29 ENCOUNTER — Inpatient Hospital Stay (HOSPITAL_COMMUNITY)
Admission: RE | Admit: 2012-03-29 | Discharge: 2012-03-31 | DRG: 775 | Disposition: A | Discharge status: Home / Self Care | Payer: Medicaid Other | Source: Ambulatory Visit | Attending: Obstetrics and Gynecology | Admitting: Obstetrics and Gynecology

## 2012-03-29 ENCOUNTER — Inpatient Hospital Stay (HOSPITAL_COMMUNITY): Payer: Medicaid Other | Admitting: Anesthesiology

## 2012-03-29 ENCOUNTER — Encounter (HOSPITAL_COMMUNITY): Payer: Self-pay

## 2012-03-29 DIAGNOSIS — O24419 Gestational diabetes mellitus in pregnancy, unspecified control: Secondary | ICD-10-CM | POA: Diagnosis present

## 2012-03-29 DIAGNOSIS — O99814 Abnormal glucose complicating childbirth: Principal | ICD-10-CM | POA: Diagnosis present

## 2012-03-29 LAB — CBC
HCT: 38.6 % (ref 36.0–46.0)
Hemoglobin: 13.2 g/dL (ref 12.0–15.0)
MCV: 88.9 fL (ref 78.0–100.0)
RBC: 4.34 MIL/uL (ref 3.87–5.11)
RDW: 14.2 % (ref 11.5–15.5)
WBC: 11.6 10*3/uL — ABNORMAL HIGH (ref 4.0–10.5)

## 2012-03-29 LAB — GLUCOSE, CAPILLARY
Glucose-Capillary: 85 mg/dL (ref 70–99)
Glucose-Capillary: 87 mg/dL (ref 70–99)
Glucose-Capillary: 101 mg/dL — ABNORMAL HIGH (ref 70–99)

## 2012-03-29 LAB — TYPE AND SCREEN: Antibody Screen: NEGATIVE

## 2012-03-29 MED ORDER — EPHEDRINE 5 MG/ML INJ: 10.0000 mg | INTRAVENOUS | Status: DC | PRN

## 2012-03-29 MED ORDER — OXYTOCIN BOLUS FROM INFUSION: 500.0000 mL | INTRAVENOUS | Status: DC

## 2012-03-29 MED ORDER — PHENYLEPHRINE 40 MCG/ML (10ML) SYRINGE FOR IV PUSH (FOR BLOOD PRESSURE SUPPORT)
80.0000 ug | PREFILLED_SYRINGE | INTRAVENOUS | Status: DC | PRN
  Filled 2012-03-29: qty 5

## 2012-03-29 MED ORDER — PHENYLEPHRINE 40 MCG/ML (10ML) SYRINGE FOR IV PUSH (FOR BLOOD PRESSURE SUPPORT): 80.0000 ug | PREFILLED_SYRINGE | INTRAVENOUS | Status: DC | PRN

## 2012-03-29 MED ORDER — SIMETHICONE 80 MG PO CHEW
80.0000 mg | CHEWABLE_TABLET | ORAL | Status: DC | PRN
  Administered 2012-03-30: 80 mg via ORAL

## 2012-03-29 MED ORDER — ACETAMINOPHEN 325 MG PO TABS: 650.0000 mg | ORAL_TABLET | ORAL | Status: DC | PRN

## 2012-03-29 MED ORDER — ONDANSETRON HCL 4 MG PO TABS: 4.0000 mg | ORAL_TABLET | ORAL | Status: DC | PRN

## 2012-03-29 MED ORDER — MEASLES, MUMPS & RUBELLA VAC ~~LOC~~ INJ: 0.5000 mL | INJECTION | Freq: Once | SUBCUTANEOUS | Status: DC

## 2012-03-29 MED ORDER — WITCH HAZEL-GLYCERIN EX PADS
1.0000 "application " | MEDICATED_PAD | CUTANEOUS | Status: DC | PRN
  Administered 2012-03-29: 1 via TOPICAL

## 2012-03-29 MED ORDER — EPHEDRINE 5 MG/ML INJ
10.0000 mg | INTRAVENOUS | Status: DC | PRN
  Filled 2012-03-29: qty 4

## 2012-03-29 MED ORDER — IBUPROFEN 600 MG PO TABS: 600.0000 mg | ORAL_TABLET | Freq: Four times a day (QID) | ORAL | Status: DC | PRN

## 2012-03-29 MED ORDER — ONDANSETRON HCL 4 MG/2ML IJ SOLN: 4.0000 mg | INTRAMUSCULAR | Status: DC | PRN

## 2012-03-29 MED ORDER — ZOLPIDEM TARTRATE 5 MG PO TABS: 5.0000 mg | ORAL_TABLET | Freq: Every evening | ORAL | Status: DC | PRN

## 2012-03-29 MED ORDER — CITRIC ACID-SODIUM CITRATE 334-500 MG/5ML PO SOLN: 30.0000 mL | ORAL | Status: DC | PRN

## 2012-03-29 MED ORDER — PNEUMOCOCCAL VAC POLYVALENT 25 MCG/0.5ML IJ INJ
0.5000 mL | INJECTION | INTRAMUSCULAR | Status: AC
  Filled 2012-03-29: qty 0.5

## 2012-03-29 MED ORDER — METHYLERGONOVINE MALEATE 0.2 MG/ML IJ SOLN: 0.2000 mg | INTRAMUSCULAR | Status: DC | PRN

## 2012-03-29 MED ORDER — BENZOCAINE-MENTHOL 20-0.5 % EX AERO
1.0000 "application " | INHALATION_SPRAY | CUTANEOUS | Status: DC | PRN
  Administered 2012-03-29: 1 via TOPICAL
  Filled 2012-03-29: qty 56

## 2012-03-29 MED ORDER — LANOLIN HYDROUS EX OINT: TOPICAL_OINTMENT | CUTANEOUS | Status: DC | PRN

## 2012-03-29 MED ORDER — LACTATED RINGERS IV SOLN
INTRAVENOUS | Status: DC
Start: 2012-03-29 — End: 2012-03-29
  Administered 2012-03-29 (×2): 1000 mL via INTRAVENOUS

## 2012-03-29 MED ORDER — MAGNESIUM HYDROXIDE 400 MG/5ML PO SUSP: 30.0000 mL | ORAL | Status: DC | PRN

## 2012-03-29 MED ORDER — ONDANSETRON HCL 4 MG/2ML IJ SOLN: 4.0000 mg | Freq: Four times a day (QID) | INTRAMUSCULAR | Status: DC | PRN

## 2012-03-29 MED ORDER — DIPHENHYDRAMINE HCL 50 MG/ML IJ SOLN: 12.5000 mg | INTRAMUSCULAR | Status: DC | PRN

## 2012-03-29 MED ORDER — DIPHENHYDRAMINE HCL 25 MG PO CAPS: 25.0000 mg | ORAL_CAPSULE | Freq: Four times a day (QID) | ORAL | Status: DC | PRN

## 2012-03-29 MED ORDER — OXYCODONE-ACETAMINOPHEN 5-325 MG PO TABS
1.0000 | ORAL_TABLET | ORAL | Status: DC | PRN
  Administered 2012-03-29 – 2012-03-30 (×3): 1 via ORAL
  Filled 2012-03-29 (×4): qty 1

## 2012-03-29 MED ORDER — LIDOCAINE HCL (PF) 1 % IJ SOLN
INTRAMUSCULAR | Status: DC | PRN
  Administered 2012-03-29 (×2): 5 mL

## 2012-03-29 MED ORDER — DIBUCAINE 1 % RE OINT
1.0000 "application " | TOPICAL_OINTMENT | RECTAL | Status: DC | PRN
  Administered 2012-03-29: 1 via RECTAL
  Filled 2012-03-29: qty 28

## 2012-03-29 MED ORDER — LACTATED RINGERS IV SOLN: 500.0000 mL | Freq: Once | INTRAVENOUS | Status: DC

## 2012-03-29 MED ORDER — FENTANYL 2.5 MCG/ML BUPIVACAINE 1/10 % EPIDURAL INFUSION (WH - ANES)
14.0000 mL/h | INTRAMUSCULAR | Status: DC
  Administered 2012-03-29: 14 mL/h via EPIDURAL
  Filled 2012-03-29: qty 125

## 2012-03-29 MED ORDER — PRENATAL MULTIVITAMIN CH
1.0000 | ORAL_TABLET | Freq: Every day | ORAL | Status: DC
  Administered 2012-03-29 – 2012-03-31 (×3): 1 via ORAL
  Filled 2012-03-29 (×3): qty 1

## 2012-03-29 MED ORDER — LIDOCAINE HCL (PF) 1 % IJ SOLN
30.0000 mL | INTRAMUSCULAR | Status: DC | PRN
  Administered 2012-03-29: 30 mL via SUBCUTANEOUS
  Filled 2012-03-29: qty 30

## 2012-03-29 MED ORDER — TERBUTALINE SULFATE 1 MG/ML IJ SOLN: 0.2500 mg | Freq: Once | INTRAMUSCULAR | Status: DC | PRN

## 2012-03-29 MED ORDER — TETANUS-DIPHTH-ACELL PERTUSSIS 5-2.5-18.5 LF-MCG/0.5 IM SUSP: 0.5000 mL | Freq: Once | INTRAMUSCULAR | Status: DC

## 2012-03-29 MED ORDER — IBUPROFEN 600 MG PO TABS
600.0000 mg | ORAL_TABLET | Freq: Four times a day (QID) | ORAL | Status: DC
  Administered 2012-03-29 – 2012-03-31 (×6): 600 mg via ORAL
  Filled 2012-03-29 (×6): qty 1

## 2012-03-29 MED ORDER — OXYTOCIN 40 UNITS IN LACTATED RINGERS INFUSION - SIMPLE MED: 62.5000 mL/h | INTRAVENOUS | Status: DC

## 2012-03-29 MED ORDER — OXYTOCIN 40 UNITS IN LACTATED RINGERS INFUSION - SIMPLE MED
1.0000 m[IU]/min | INTRAVENOUS | Status: DC
  Administered 2012-03-29: 2 m[IU]/min via INTRAVENOUS
  Administered 2012-03-29: 62.5 mL/h via INTRAVENOUS
  Filled 2012-03-29: qty 1000

## 2012-03-29 MED ORDER — METHYLERGONOVINE MALEATE 0.2 MG PO TABS: 0.2000 mg | ORAL_TABLET | ORAL | Status: DC | PRN

## 2012-03-29 MED ORDER — SENNOSIDES-DOCUSATE SODIUM 8.6-50 MG PO TABS
2.0000 | ORAL_TABLET | Freq: Every day | ORAL | Status: DC
  Administered 2012-03-29 – 2012-03-30 (×2): 2 via ORAL

## 2012-03-29 MED ORDER — OXYCODONE-ACETAMINOPHEN 5-325 MG PO TABS: 1.0000 | ORAL_TABLET | ORAL | Status: DC | PRN

## 2012-03-29 MED ORDER — LACTATED RINGERS IV SOLN
500.0000 mL | INTRAVENOUS | Status: DC | PRN
Start: 2012-03-29 — End: 2012-03-29
  Administered 2012-03-29: 500 mL via INTRAVENOUS

## 2012-03-29 NOTE — Progress Notes (Signed)
Feeling ctx Afeb, VSS CBG 90-130 FHT- Cat I, ctx q 3 min VE- 3/70/-1, vtx, AROM light meconium Continue pitocin and monitor progress

## 2012-03-29 NOTE — Anesthesia Procedure Notes (Signed)
Epidural Patient location during procedure: OB Start time: 03/29/2012 12:20 PM  Staffing Anesthesiologist: Brayton Caves R  Preanesthetic Checklist Completed: patient identified, site marked, surgical consent, pre-op evaluation, timeout performed, IV checked, risks and benefits discussed and monitors and equipment checked  Epidural Patient position: sitting Prep: site prepped and draped and DuraPrep Patient monitoring: continuous pulse ox and blood pressure Approach: midline Injection technique: LOR air and LOR saline  Needle:  Needle type: Tuohy  Needle gauge: 17 G Needle length: 9 cm and 9 Needle insertion depth: 7 cm Catheter type: closed end flexible Catheter size: 19 Gauge Catheter at skin depth: 12 cm Test dose: negative  Assessment Events: blood not aspirated, injection not painful, no injection resistance, negative IV test and no paresthesia  Additional Notes Patient identified.  Risk benefits discussed including failed block, incomplete pain control, headache, nerve damage, paralysis, blood pressure changes, nausea, vomiting, reactions to medication both toxic or allergic, and postpartum back pain.  Patient expressed understanding and wished to proceed.  All questions were answered.  Sterile technique used throughout procedure and epidural site dressed with sterile barrier dressing. No paresthesia or other complications noted.The patient did not experience any signs of intravascular injection such as tinnitus or metallic taste in mouth nor signs of intrathecal spread such as rapid motor block. Please see nursing notes for vital signs.

## 2012-03-29 NOTE — Anesthesia Preprocedure Evaluation (Signed)
Anesthesia Evaluation  Patient identified by MRN, date of birth, ID band Patient awake    Reviewed: Allergy & Precautions, H&P , Patient's Chart, lab work & pertinent test results  Airway Mallampati: II TM Distance: >3 FB Neck ROM: full    Dental No notable dental hx.    Pulmonary neg pulmonary ROS,  breath sounds clear to auscultation  Pulmonary exam normal       Cardiovascular negative cardio ROS  Rhythm:regular Rate:Normal     Neuro/Psych negative neurological ROS  negative psych ROS   GI/Hepatic negative GI ROS, Neg liver ROS,   Endo/Other  negative endocrine ROSdiabetesMorbid obesity  Renal/GU negative Renal ROS     Musculoskeletal   Abdominal   Peds  Hematology negative hematology ROS (+)   Anesthesia Other Findings Depression     Herpes   negative culture in 1/13    Gestational diabetes   glyburide Anxiety    Reproductive/Obstetrics (+) Pregnancy                           Anesthesia Physical Anesthesia Plan  ASA: III  Anesthesia Plan: Epidural   Post-op Pain Management:    Induction:   Airway Management Planned:   Additional Equipment:   Intra-op Plan:   Post-operative Plan:   Informed Consent: I have reviewed the patients History and Physical, chart, labs and discussed the procedure including the risks, benefits and alternatives for the proposed anesthesia with the patient or authorized representative who has indicated his/her understanding and acceptance.     Plan Discussed with:   Anesthesia Plan Comments:         Anesthesia Quick Evaluation

## 2012-03-29 NOTE — H&P (Signed)
Jamie Palmer is a 26 y.o. female, G2 P1001, EGA 39+ weeks with [facility] 11-20 presenting for induction.  Prenatal care complicated by GDM controlled with low dose Glyburide.  Also put on Valtrex suppression at term for ? H/o HSV.  See prenatal records for complete history.  Maternal Medical History:  Fetal activity: Perceived fetal activity is normal.    Prenatal Complications - Diabetes: gestational. Diabetes is managed by oral agent (monotherapy).      OB History    Grav Para Term Preterm Abortions TAB SAB Ect Mult Living   2 1 1       1     SVD at term, 7 lbs 7oz, GDM  Past Medical History  Diagnosis Date  . Depression   . Herpes     negative culture in 1/13  . Gestational diabetes     glyburide  . Anxiety    Past Surgical History  Procedure Date  . Appendectomy   . Foot surgery    Family History: family history includes Asthma in her other; COPD in her other; Cancer in her father, maternal grandfather, maternal grandmother, mother, and other; Diabetes in her maternal aunt and other; Heart attack in her maternal aunt and other; Heart disease in her maternal aunt and other; Hyperlipidemia in her other; Hypertension in her maternal aunt and other; and Obesity in her other.  There is no history of Other. Social History:  reports that she has been smoking Cigarettes.  She has a 5 pack-year smoking history. She has never used smokeless tobacco. She reports that she does not drink alcohol or use illicit drugs.   Prenatal Transfer Tool  Maternal Diabetes: Yes:  Diabetes Type:  Insulin/Medication controlled Genetic Screening: Normal Maternal Ultrasounds/Referrals: Normal Fetal Ultrasounds or other Referrals:  None Maternal Substance Abuse:  No Significant Maternal Medications:  Meds include: Other:  Significant Maternal Lab Results:  Lab values include: Group B Strep negative Other Comments:  GDM controlled with Glyburide, ? h.o HSV, on Valtrex suppression  Review of Systems    Respiratory: Negative.   Cardiovascular: Negative.     Dilation: 2 Effacement (%): 70 Station: -2 Exam by:: Dherr rn Blood pressure 112/59, pulse 70, temperature 98.8 F (37.1 C), temperature source Oral, resp. rate 20, height 5\' 6"  (1.676 m), weight 102.059 kg (225 lb). Maternal Exam:  Uterine Assessment: Contraction strength is mild.  Contraction frequency is irregular.   Abdomen: Patient reports no abdominal tenderness. Estimated fetal weight is 7 1/2 lbs.   Fetal presentation: vertex  Introitus: Normal vulva. Normal vagina.  Pelvis: adequate for delivery.   Cervix: Cervix evaluated by digital exam.     Fetal Exam Fetal Monitor Review: Mode: ultrasound.   Baseline rate: 120.  Variability: moderate (6-25 bpm).   Pattern: accelerations present and no decelerations.    Fetal State Assessment: Category I - tracings are normal.     Physical Exam  Constitutional: She appears well-developed and well-nourished.  Cardiovascular: Normal rate, regular rhythm and normal heart sounds.   No murmur heard. Respiratory: Effort normal and breath sounds normal. No respiratory distress.  GI: Soft.       Gravid     Prenatal labs: ABO, Rh: A/Positive/-- (04/18 0000) Antibody: Negative (04/18 0000) Rubella: Immune (04/18 0000) RPR: Nonreactive (04/18 0000)  HBsAg: Negative (04/18 0000)  HIV: Non-reactive (04/18 0000)  GBS: Negative (10/21 0000)  GCT:  202  Assessment/Plan: IUP at 39+ weeks with favorable cervix, GDM controlled with Glyburide.  On pitocin, monitor progress,  will check CBG q 2 hrs for now to make sure stable.     Bertil Brickey D 03/29/2012, 8:16 AM

## 2012-03-30 NOTE — Anesthesia Postprocedure Evaluation (Signed)
  Anesthesia Post-op Note  Patient: Jamie Palmer  Procedure(s) Performed: * No procedures listed *  Patient Location: Mother/Baby  Anesthesia Type:Epidural  Level of Consciousness: awake, alert  and oriented  Airway and Oxygen Therapy: Patient Spontanous Breathing  Post-op Pain: mild  Post-op Assessment: Patient's Cardiovascular Status Stable, Respiratory Function Stable, Patent Airway, No signs of Nausea or vomiting and Pain level controlled  Post-op Vital Signs: stable  Complications: No apparent anesthesia complications

## 2012-03-30 NOTE — Clinical Social Work Note (Signed)
CSW spoke with MOB briefly.  No current emotional concerns.  Hx was over 3 years ago.   Patient was referred for history of depression/anxiety. * Referral screened out by Clinical Social Worker because none of the following criteria appear to apply: ~ History of anxiety/depression during this pregnancy, or of post-partum depression. ~ Diagnosis of anxiety and/or depression within last 3 years ~ History of depression due to pregnancy loss/loss of child  OR  * Patient's symptoms currently being treated with medication and/or therapy.  Please contact the Clinical Social Worker if needs arise, or by the patient's request. [phone]  

## 2012-03-30 NOTE — Progress Notes (Signed)
PPD #1 No problems, baby not latching on well Afeb, VSS  CBG ok, 174 after dinner Fundus firm, NT at U-0 Continue routine postpartum care, will continue to check CBG today

## 2012-03-30 NOTE — Progress Notes (Signed)
Called Tedra and left message for consult for history of depression and anxiety.

## 2012-03-31 LAB — GLUCOSE, CAPILLARY: Glucose-Capillary: 88 mg/dL (ref 70–99)

## 2012-03-31 MED ORDER — IBUPROFEN 600 MG PO TABS: 600.0000 mg | ORAL_TABLET | Freq: Four times a day (QID) | ORAL | Status: DC

## 2012-03-31 MED ORDER — OXYCODONE-ACETAMINOPHEN 5-325 MG PO TABS: 1.0000 | ORAL_TABLET | ORAL | Status: DC | PRN

## 2012-03-31 NOTE — Progress Notes (Signed)
PPD #2 No problems Afeb, VSS CBG- elevated after breakfast yesterday, normal otherwise Fundus firm, NT at U-1 Continue routine postpartum care, d/c home

## 2012-03-31 NOTE — Discharge Summary (Signed)
Obstetric Discharge Summary Reason for Admission: induction of labor Prenatal Procedures: NST Intrapartum Procedures: spontaneous vaginal delivery Postpartum Procedures: none Complications-Operative and Postpartum: 1st degree perineal laceration Hemoglobin  Date Value Range Status  03/29/2012 13.2  12.0 - 15.0 g/dL Final     HCT  Date Value Range Status  03/29/2012 38.6  36.0 - 46.0 % Final    Physical Exam:  General: alert Lochia: appropriate Uterine Fundus: firm  Discharge Diagnoses: Term Pregnancy-delivered and Gestational diabetes  Discharge Information: Date: 03/31/2012 Activity: pelvic rest Diet: routine Medications: Ibuprofen and Percocet Condition: stable Instructions: refer to practice specific booklet Discharge to: home Follow-up Information    Follow up with Shulem Mader D, MD. Schedule an appointment as soon as possible for a visit in 6 weeks.   Contact information:   [address] [place] [address] [phone]          Newborn Data: Live born female  Birth Weight: 6 lb 5 oz (2863 g) APGAR: 9, 9  Home with mother.  Baylor Cortez D 03/31/2012, 9:38 AM

## 2012-04-02 NOTE — Progress Notes (Signed)
Post discharge chart review completed.  

## 2012-04-20 ENCOUNTER — Inpatient Hospital Stay (HOSPITAL_COMMUNITY)
Admission: AD | Admit: 2012-04-20 | Discharge: 2012-04-20 | Disposition: A | Discharge status: Home / Self Care | Payer: Medicaid Other | Source: Ambulatory Visit | Attending: Obstetrics and Gynecology | Admitting: Obstetrics and Gynecology

## 2012-04-20 ENCOUNTER — Encounter (HOSPITAL_COMMUNITY): Payer: Self-pay

## 2012-04-20 DIAGNOSIS — N61 Mastitis without abscess: Secondary | ICD-10-CM

## 2012-04-20 DIAGNOSIS — N644 Mastodynia: Secondary | ICD-10-CM | POA: Insufficient documentation

## 2012-04-20 DIAGNOSIS — O9122 Nonpurulent mastitis associated with the puerperium: Secondary | ICD-10-CM | POA: Insufficient documentation

## 2012-04-20 LAB — CBC
HCT: 41.9 % (ref 36.0–46.0)
Hemoglobin: 13.5 g/dL (ref 12.0–15.0)
RBC: 4.66 MIL/uL (ref 3.87–5.11)
WBC: 15.8 10*3/uL — ABNORMAL HIGH (ref 4.0–10.5)

## 2012-04-20 MED ORDER — CEPHALEXIN 500 MG PO CAPS
500.0000 mg | ORAL_CAPSULE | Freq: Once | ORAL | Status: AC
  Administered 2012-04-20: 500 mg via ORAL
  Filled 2012-04-20: qty 1

## 2012-04-20 MED ORDER — KETOROLAC TROMETHAMINE 60 MG/2ML IM SOLN
60.0000 mg | Freq: Once | INTRAMUSCULAR | Status: AC
  Administered 2012-04-20: 60 mg via INTRAMUSCULAR
  Filled 2012-04-20: qty 2

## 2012-04-20 MED ORDER — CEPHALEXIN 500 MG PO CAPS: 500.0000 mg | ORAL_CAPSULE | Freq: Four times a day (QID) | ORAL | Status: DC

## 2012-04-20 NOTE — MAU Note (Signed)
Patient states she had a SVD on 11-15. States she has stopped breast feeding and a few days ago had an abscess on the right nipple that drained blood and pus but has now healed over. Started having engorgement of the left breast and had blood an puss come out of the nipple. Patient states she feels drained and tired.

## 2012-04-20 NOTE — MAU Provider Note (Signed)
History     CSN: [id number]  Arrival date and time: 04/20/12 1418   None     Chief Complaint  Patient presents with  . Galactorrhea   HPI Pt is s/p SVD 03/29/2012 and presents with left breast pain with pus from nipple.  Pt stopped breast feeding last week  And has done the cabbage leaves- she had engorgement and then resolved and now left breast is sore and tender to touch.  Pt had right breast abscess that has healed. Pt has not taken anything for pain.  Past Medical History  Diagnosis Date  . Depression   . Herpes     negative culture in 1/13  . Gestational diabetes     glyburide  . Anxiety     Past Surgical History  Procedure Date  . Appendectomy   . Foot surgery     Family History  Problem Relation Age of Onset  . Diabetes Other   . Hypertension Other   . Heart disease Other   . Cancer Other   . COPD Other   . Asthma Other   . Heart attack Other   . Obesity Other   . Hyperlipidemia Other   . Other Neg Hx   . Cancer Mother     lung  . Cancer Maternal Grandmother     lung  . Cancer Father   . Heart attack Maternal Aunt   . Hypertension Maternal Aunt   . Diabetes Maternal Aunt   . Heart disease Maternal Aunt   . Cancer Maternal Grandfather     lung    History  Substance Use Topics  . Smoking status: Current Every Day Smoker -- 0.5 packs/day for 10 years    Types: Cigarettes  . Smokeless tobacco: Never Used  . Alcohol Use: No    Allergies: No Known Allergies  No prescriptions prior to admission    Review of Systems  Constitutional: Positive for malaise/fatigue. Negative for fever and chills.  Gastrointestinal: Negative for nausea, vomiting, abdominal pain, diarrhea and constipation.  Genitourinary: Negative for dysuria and urgency.  Musculoskeletal: Positive for back pain.       From epidural injection  Neurological: Negative for headaches.   Physical Exam   Blood pressure 130/74, pulse 92, temperature 99.4 F (37.4 C), temperature  source Oral, height 5\' 7"  (1.702 m), weight 208 lb 6.4 oz (94.53 kg).  Physical Exam  Nursing note and vitals reviewed. Constitutional: She appears well-developed and well-nourished. No distress.  HENT:  Head: Normocephalic.  Eyes: Pupils are equal, round, and reactive to light.  Neck: Normal range of motion. Neck supple.  Cardiovascular: Normal rate.   Respiratory: Effort normal. Left breast exhibits tenderness.       Left breast tender, firm reddened periareola from 9 to 4 oclock position  GI: Soft.    MAU Course  Procedures Toradol 60mg  IM  Keflex 500mg  started in MAU and prescription given Results for orders placed during the hospital encounter of 04/20/12 (from the past 24 hour(s))  CBC     Status: Abnormal   Collection Time   04/20/12  4:46 PM      Component Value Range   WBC 15.8 (*) 4.0 - 10.5 K/uL   RBC 4.66  3.87 - 5.11 MIL/uL   Hemoglobin 13.5  12.0 - 15.0 g/dL   HCT 40.9  81.1 - 91.4 %   MCV 89.9  78.0 - 100.0 fL   MCH 29.0  26.0 - 34.0 pg   MCHC 32.2  30.0 - 36.0 g/dL   RDW 32.4  40.1 - 02.7 %   Platelets 288  150 - 400 K/uL  . Discussed with  Dr. Ambrose Mantle- will send home with Keflex  Assessment and Plan  Mastitis- Keflex 500mg  QID for 7 days F/u if increase in symtpoms  Zarion Oliff 04/20/2012, 4:46 PM

## 2012-06-23 ENCOUNTER — Emergency Department (HOSPITAL_COMMUNITY)
Admission: EM | Admit: 2012-06-23 | Discharge: 2012-06-23 | Disposition: A | Discharge status: Home / Self Care | Payer: Medicaid Other | Source: Home / Self Care

## 2012-06-23 ENCOUNTER — Encounter (HOSPITAL_COMMUNITY): Payer: Self-pay | Admitting: Emergency Medicine

## 2012-06-23 DIAGNOSIS — H6980 Other specified disorders of Eustachian tube, unspecified ear: Secondary | ICD-10-CM

## 2012-06-23 DIAGNOSIS — J069 Acute upper respiratory infection, unspecified: Secondary | ICD-10-CM

## 2012-06-23 MED ORDER — ANTIPYRINE-BENZOCAINE 5.4-1.4 % OT SOLN: 3.0000 [drp] | OTIC | Status: DC | PRN

## 2012-06-23 MED ORDER — METHYLPREDNISOLONE 4 MG PO KIT: PACK | ORAL | Status: DC

## 2012-06-23 NOTE — ED Provider Notes (Signed)
History     CSN: [id number]  Arrival date & time 06/23/12  1109   First MD Initiated Contact with Patient 06/23/12 1113      Chief Complaint  Patient presents with  . Ear Fullness    ear pain and fullness since 2/7 gradually getting worse.     (Consider location/radiation/quality/duration/timing/severity/associated sxs/prior treatment) HPI Comments: 27 year old female comes in with complaints of ear discomfort. She states that her ears feel like there is water in the ear and a great amount of pressure. She has slept on her left side last night and now her left ear hurts much worse in her life. She is recently developed symptoms of respiratory congestion such as nasal congestion and PND. She denies fever, sore throat, chest pain, shortness of breath GI or GU symptoms.   Past Medical History  Diagnosis Date  . Depression   . Herpes     negative culture in 1/13  . Gestational diabetes     glyburide  . Anxiety     Past Surgical History  Procedure Laterality Date  . Appendectomy    . Foot surgery      Family History  Problem Relation Age of Onset  . Diabetes Other   . Hypertension Other   . Heart disease Other   . Cancer Other   . COPD Other   . Asthma Other   . Heart attack Other   . Obesity Other   . Hyperlipidemia Other   . Other Neg Hx   . Cancer Mother     lung  . Cancer Maternal Grandmother     lung  . Cancer Father   . Heart attack Maternal Aunt   . Hypertension Maternal Aunt   . Diabetes Maternal Aunt   . Heart disease Maternal Aunt   . Cancer Maternal Grandfather     lung    History  Substance Use Topics  . Smoking status: Current Every Day Smoker -- 0.50 packs/day for 10 years    Types: Cigarettes  . Smokeless tobacco: Never Used  . Alcohol Use: No    OB History   Grav Para Term Preterm Abortions TAB SAB Ect Mult Living   2 2 2       2       Review of Systems  Constitutional: Negative for fever, chills, activity change, appetite change and  fatigue.  HENT: Positive for hearing loss, ear pain, congestion, rhinorrhea and postnasal drip. Negative for facial swelling, neck pain, neck stiffness and ear discharge.   Eyes: Negative.   Respiratory: Negative.   Cardiovascular: Negative.   Skin: Negative for pallor and rash.  Neurological: Negative.     Allergies  Review of patient's allergies indicates no known allergies.  Home Medications   Current Outpatient Rx  Name  Route  Sig  Dispense  Refill  . antipyrine-benzocaine (AURALGAN) otic solution   Both Ears   Place 3 drops into both ears every 2 (two) hours as needed for pain.   10 mL   0   . cephALEXin (KEFLEX) 500 MG capsule   Oral   Take 1 capsule (500 mg total) by mouth 4 (four) times daily.   20 capsule   0   . methylPREDNISolone (MEDROL DOSEPAK) 4 MG tablet      As directed   21 tablet   0     BP 132/98  Pulse 67  Temp(Src) 98.2 F (36.8 C) (Oral)  Resp 18  SpO2 100%  LMP 05/29/2012  Breastfeeding? No  Physical Exam  Constitutional: She is oriented to person, place, and time. She appears well-developed and well-nourished. No distress.  HENT:  Bilateral TMs are retracted left greater than the right. No erythema, effusion or bulging. Oropharynx with minor erythema and clear PND no exudates.  Neck: Normal range of motion. Neck supple.  Cardiovascular: Normal rate and regular rhythm.   Pulmonary/Chest: Effort normal and breath sounds normal. No respiratory distress.  Musculoskeletal: Normal range of motion. She exhibits no edema.  Lymphadenopathy:    She has no cervical adenopathy.  Neurological: She is alert and oriented to person, place, and time.  Skin: Skin is warm and dry. No rash noted.  Psychiatric: She has a normal mood and affect.    ED Course  Procedures (including critical care time)  Labs Reviewed - No data to display No results found.   1. ETD (eustachian tube dysfunction)   2. URI, acute       MDM  Start in stable  condition. The signs of infection. For eustachian tube dysfunction and congestion advised her to take Sudafed PE 10 mg every 4 hours as needed Guaifenesin 100-200 mg every 4-6 hours Auralgan drops to both ears as needed for pain Medrol Dosepak #21 Followup with your doctor as needed.         Hayden Rasmussen, NP 06/23/12 1145

## 2012-06-23 NOTE — ED Notes (Signed)
Pt c/o bilateral ear pain and fullness since 2/7. Pt is having some head congestion. Pt has tried cleaning ears and using ear drops with no relief of symptoms.

## 2012-06-24 NOTE — ED Provider Notes (Signed)
Medical screening examination/treatment/procedure(s) were performed by resident physician or non-physician practitioner and as supervising physician I was immediately available for consultation/collaboration.   Devyon Keator DOUGLAS MD.   Brinlyn Cena D Shaquisha Wynn, MD 06/24/12 1440 

## 2013-04-06 ENCOUNTER — Encounter (HOSPITAL_COMMUNITY): Payer: Self-pay | Admitting: Emergency Medicine

## 2013-04-06 ENCOUNTER — Emergency Department (HOSPITAL_COMMUNITY)
Admission: EM | Admit: 2013-04-06 | Discharge: 2013-04-06 | Disposition: A | Discharge status: Home / Self Care | Payer: Medicaid Other | Attending: Emergency Medicine | Admitting: Emergency Medicine

## 2013-04-06 DIAGNOSIS — Z8659 Personal history of other mental and behavioral disorders: Secondary | ICD-10-CM | POA: Insufficient documentation

## 2013-04-06 DIAGNOSIS — B029 Zoster without complications: Secondary | ICD-10-CM | POA: Insufficient documentation

## 2013-04-06 DIAGNOSIS — F17200 Nicotine dependence, unspecified, uncomplicated: Secondary | ICD-10-CM | POA: Insufficient documentation

## 2013-04-06 DIAGNOSIS — Z8632 Personal history of gestational diabetes: Secondary | ICD-10-CM | POA: Insufficient documentation

## 2013-04-06 DIAGNOSIS — R110 Nausea: Secondary | ICD-10-CM | POA: Insufficient documentation

## 2013-04-06 DIAGNOSIS — Z792 Long term (current) use of antibiotics: Secondary | ICD-10-CM | POA: Insufficient documentation

## 2013-04-06 DIAGNOSIS — Z3202 Encounter for pregnancy test, result negative: Secondary | ICD-10-CM | POA: Insufficient documentation

## 2013-04-06 HISTORY — DX: Zoster without complications: B02.9

## 2013-04-06 LAB — POCT PREGNANCY, URINE: Preg Test, Ur: NEGATIVE

## 2013-04-06 MED ORDER — ACYCLOVIR 400 MG PO TABS: 800.0000 mg | ORAL_TABLET | Freq: Every day | ORAL | Status: DC

## 2013-04-06 MED ORDER — DEXAMETHASONE SODIUM PHOSPHATE 10 MG/ML IJ SOLN
10.0000 mg | Freq: Once | INTRAMUSCULAR | Status: AC
  Administered 2013-04-06: 10 mg via INTRAMUSCULAR
  Filled 2013-04-06: qty 1

## 2013-04-06 NOTE — ED Notes (Signed)
Unable to locate pt  

## 2013-04-06 NOTE — ED Provider Notes (Signed)
CSN: [id number]     Arrival date & time 04/06/13  1532 History   First MD Initiated Contact with Patient 04/06/13 1618      This chart was scribed for non-physician practitioner, Roxy Horseman PA-C, working with Roney Marion, MD by Arlan Organ, ED Scribe. This patient was seen in room TR10C/TR10C and the patient's care was started at 4:28 PM.   Chief Complaint  Patient presents with  . Rash  . Nausea   Patient is a 27 y.o. female presenting with rash. The history is provided by the patient. No language interpreter was used.  Rash   HPI Comments: Jamie Palmer is a 27 y.o. female with a hx of shingles who presents to the Emergency Department complaining of a sudden onset, gradually worsening, constant itchy rash to the right buttocks that started 1 week ago. She states that this feels similar to her prior episode of shingles. She has not tried anything to alleviate her symptoms. She also reports nausea. Pt states she has been under a lot of stress and feels her nausea symptoms may be related. Pt denies any abdominal pain.   Past Medical History  Diagnosis Date  . Depression   . Herpes     negative culture in 1/13  . Gestational diabetes     glyburide  . Anxiety   . Shingles    Past Surgical History  Procedure Laterality Date  . Appendectomy    . Foot surgery     Family History  Problem Relation Age of Onset  . Diabetes Other   . Hypertension Other   . Heart disease Other   . Cancer Other   . COPD Other   . Asthma Other   . Heart attack Other   . Obesity Other   . Hyperlipidemia Other   . Other Neg Hx   . Cancer Mother     lung  . Cancer Maternal Grandmother     lung  . Cancer Father   . Heart attack Maternal Aunt   . Hypertension Maternal Aunt   . Diabetes Maternal Aunt   . Heart disease Maternal Aunt   . Cancer Maternal Grandfather     lung   History  Substance Use Topics  . Smoking status: Current Every Day Smoker -- 0.50 packs/day for 10 years   Types: Cigarettes  . Smokeless tobacco: Never Used  . Alcohol Use: No   OB History   Grav Para Term Preterm Abortions TAB SAB Ect Mult Living   2 2 2       2      Review of Systems  Skin: Positive for rash.  All other systems reviewed and are negative.   A complete 10 system review of systems was obtained and all systems are negative except as noted in the HPI and PMH.    Allergies  Review of patient's allergies indicates no known allergies.  Home Medications   Current Outpatient Rx  Name  Route  Sig  Dispense  Refill  . antipyrine-benzocaine (AURALGAN) otic solution   Both Ears   Place 3 drops into both ears every 2 (two) hours as needed for pain.   10 mL   0   . cephALEXin (KEFLEX) 500 MG capsule   Oral   Take 1 capsule (500 mg total) by mouth 4 (four) times daily.   20 capsule   0   . methylPREDNISolone (MEDROL DOSEPAK) 4 MG tablet      As directed  21 tablet   0    Triage Vitals: BP 126/73  Pulse 73  Temp(Src) 99.1 F (37.3 C) (Oral)  Resp 16  SpO2 100%  Physical Exam  Nursing note and vitals reviewed. Constitutional: She is oriented to person, place, and time. She appears well-developed and well-nourished.  HENT:  Head: Normocephalic and atraumatic.  Eyes: Conjunctivae and EOM are normal.  Neck: Normal range of motion.  Cardiovascular: Normal rate.   Pulmonary/Chest: Effort normal.  Abdominal: She exhibits no distension.  Musculoskeletal: Normal range of motion.  Neurological: She is alert and oriented to person, place, and time.  Skin: Skin is dry.  Characteristic of shingles in rash on right buttock  Psychiatric: She has a normal mood and affect. Her behavior is normal. Judgment and thought content normal.    ED Course  Procedures (including critical care time)  DIAGNOSTIC STUDIES: Oxygen Saturation is 100% on RA, Normal by my interpretation.    COORDINATION OF CARE: 4:29 PM-Discussed treatment plan with pt at bedside and pt agreed to  plan.     Labs Review Labs Reviewed  POCT PREGNANCY, URINE   Imaging Review No results found.  EKG Interpretation   None       MDM   1. Shingles    Patient with shingles, will treat with Decadron and acyclovir. Discharge to home. Patient is stable and ready for discharge. Patient discussed with Dr. Fayrene Fearing, who agrees with plan.  I personally performed the services described in this documentation, which was scribed in my presence. The recorded information has been reviewed and is accurate.     Roxy Horseman, PA-C 04/06/13 1654

## 2013-04-06 NOTE — ED Notes (Addendum)
Presents with rash to right butt cheek, small vesicle to right lower buttock c/o itching and burning. Reports feeling stressed over the past month due to a break up with her significant other and having constant nausea "think it is from stress". Reports history of same.  Denies abdominal pain.

## 2013-04-09 NOTE — ED Provider Notes (Signed)
Medical screening examination/treatment/procedure(s) were performed by non-physician practitioner and as supervising physician I was immediately available for consultation/collaboration.  EKG Interpretation   None         Adamarys Shall J Keymiah Lyles, MD 04/09/13 0729 

## 2013-04-19 IMAGING — US US PELVIS COMPLETE
1 series · 14 of 25 positions shown · non-contrast
Comparison: pelvic ultrasound 11/19/2007.

CLINICAL DATA: Pelvic pain.



[Series 1: us pelvis complete · 14 of 66 slices shown]
[im 1/66]
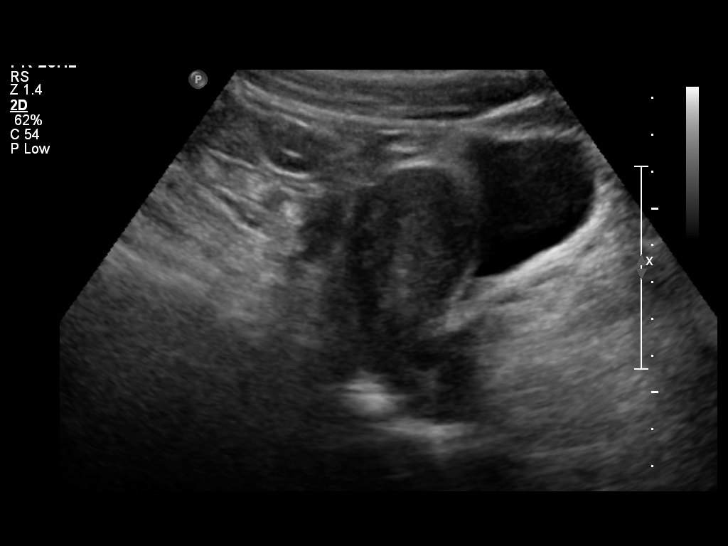
[im 6/66]
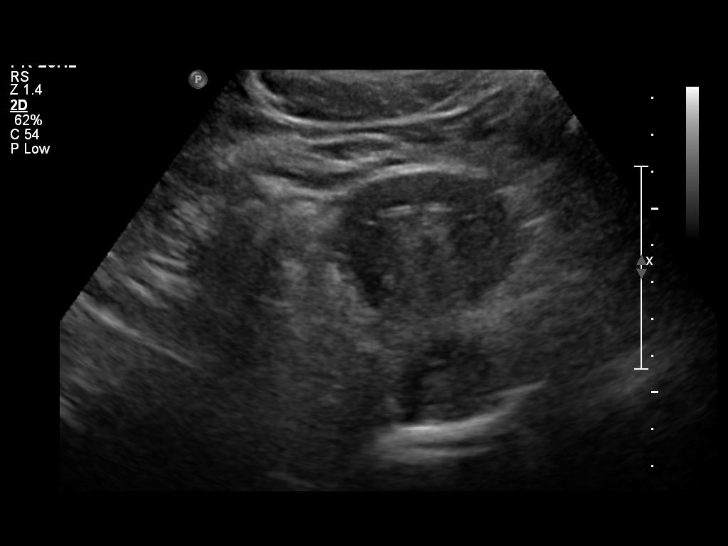
[im 11/66]
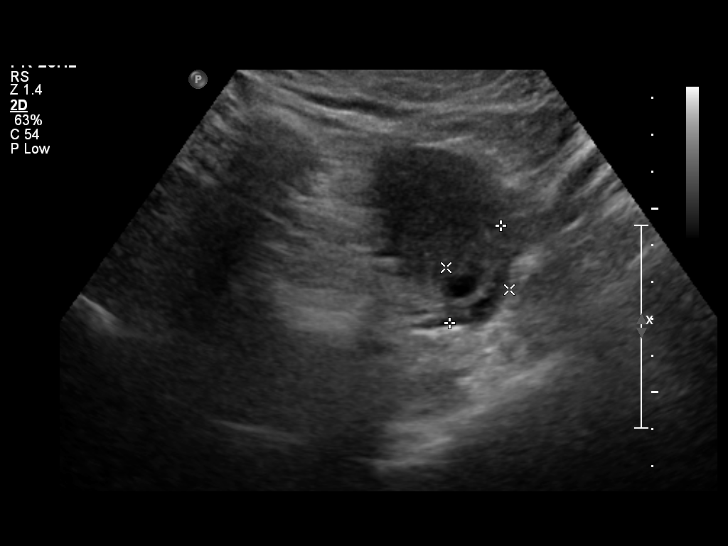
[im 17/66]
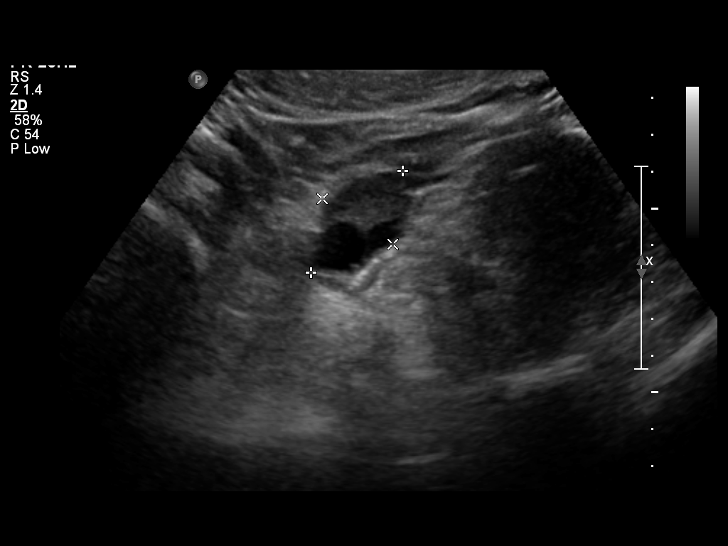
[im 22/66]
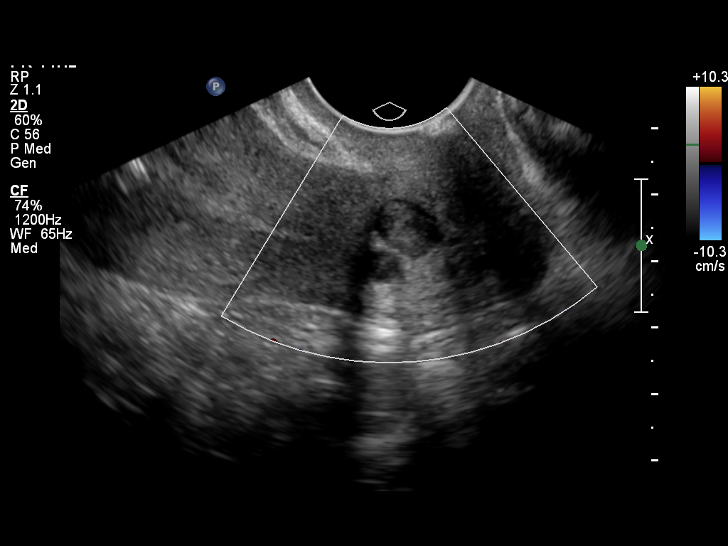
[im 25/66]
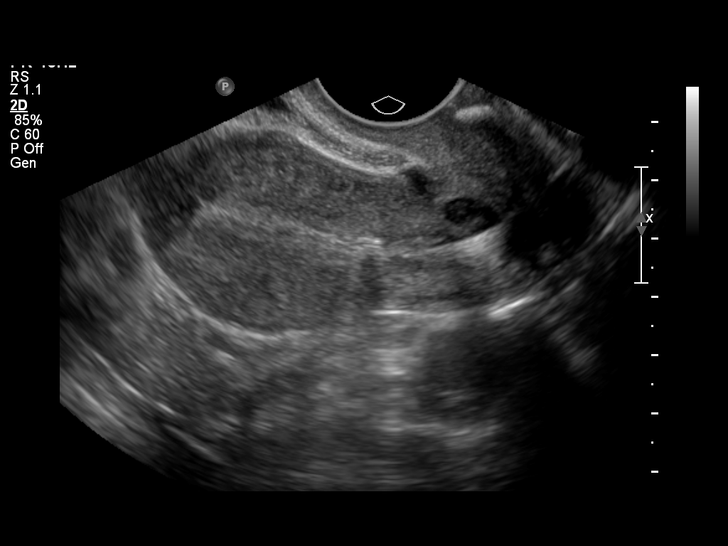
[im 30/66]
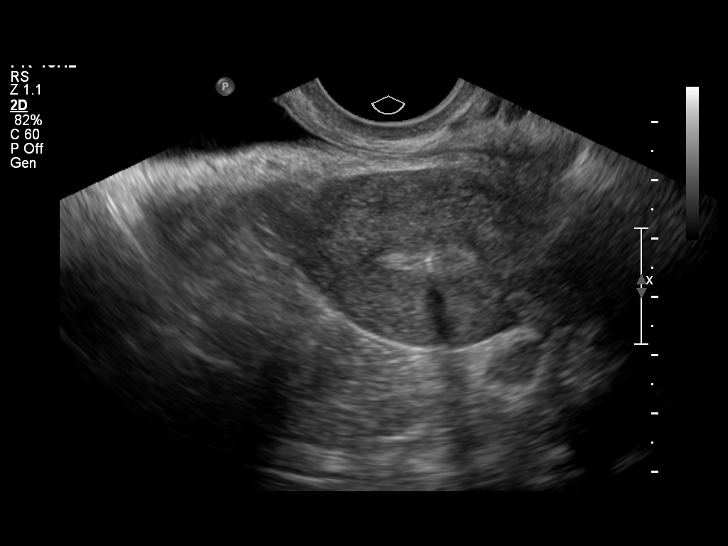
[im 36/66]
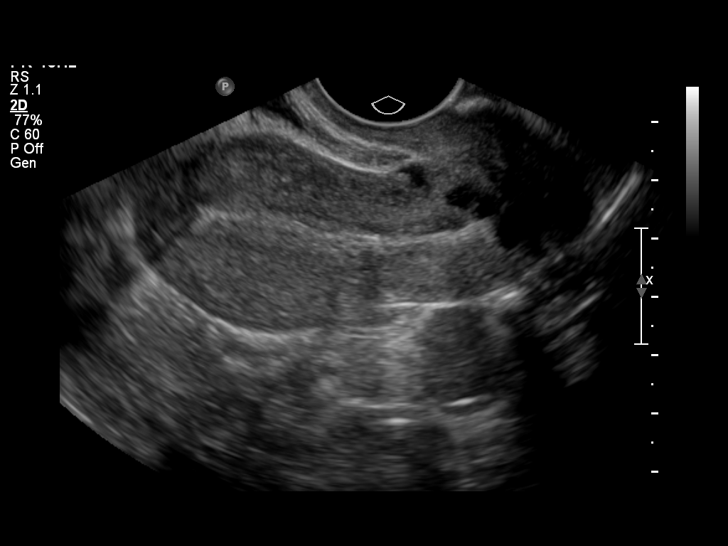
[im 41/66]
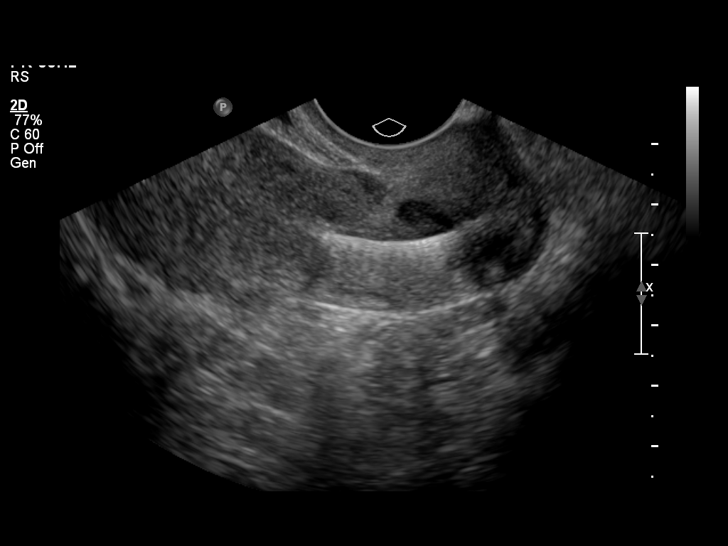
[im 44/66]
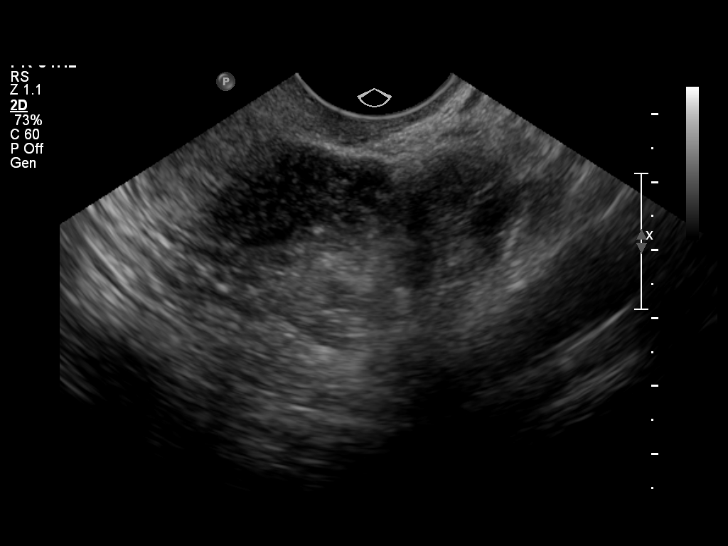
[im 49/66]
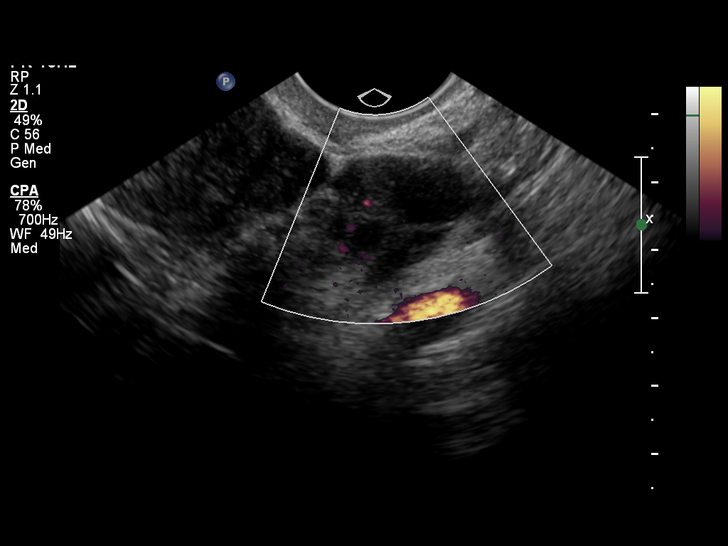
[im 55/66]
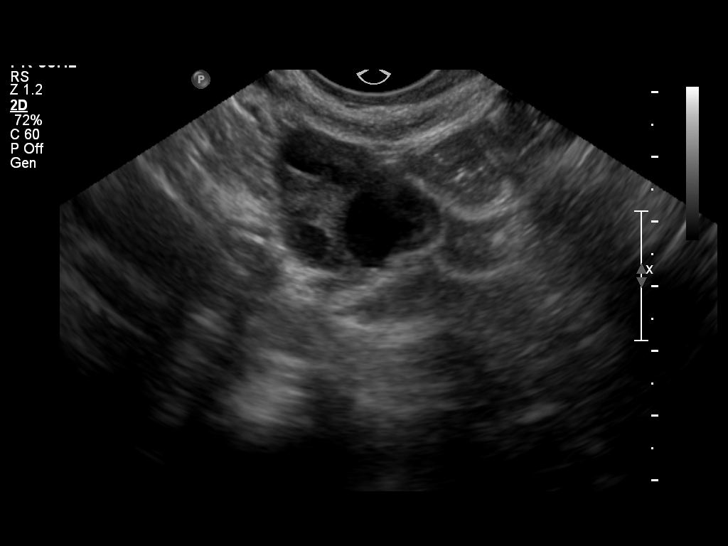
[im 60/66]
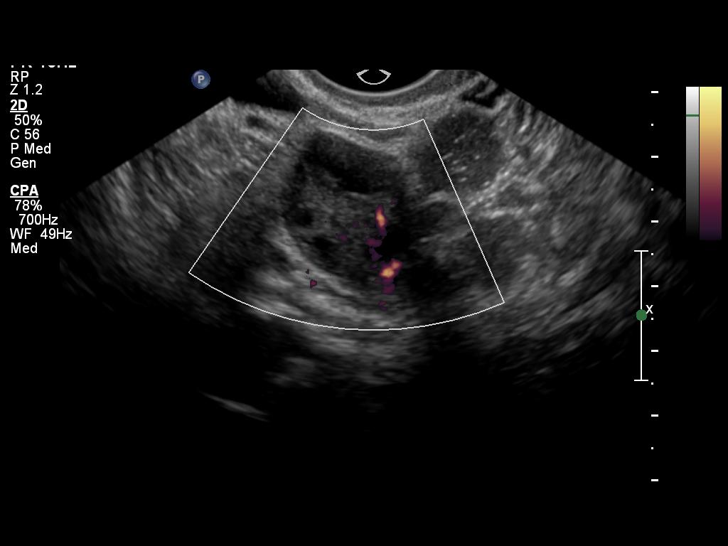
[im 66/66]
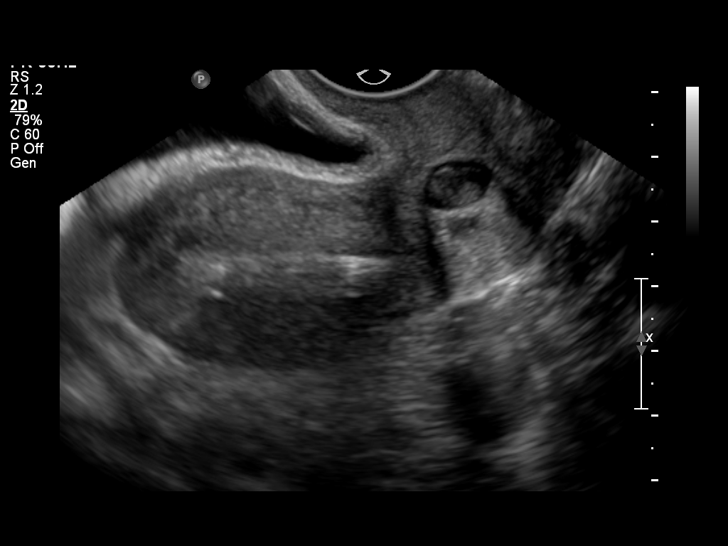

[14 of 25 positions shown; findings below may reference images not displayed]

FINDINGS: Uterus:  Measures 7.5 x 3.5 x 4.2 cm.  No uterine fibroids.  Small
Nabothian cysts are noted.

Endometrium:  Normal in thickness measuring 6 mm.  An IUD is noted
in the endometrial canal.

Right ovary:  Measures 3.4 x 2.1 x 2.2 cm.  Normal appearance/no
adnexal mass.

Left ovary:  Measures 2.6 x 1.9 x 2.2 cm.  Normal appearance/no
adnexal mass.

Other findings:  No free fluid.
IMPRESSION: 1.  Normal sonographic appearance of the uterus except for small
Nabothian cysts.
2.  IUD in the endometrial canal.
3.  Normal ovaries.

## 2013-09-10 ENCOUNTER — Encounter (HOSPITAL_COMMUNITY): Payer: Self-pay | Admitting: Emergency Medicine

## 2013-09-10 ENCOUNTER — Emergency Department (HOSPITAL_COMMUNITY)
Admission: EM | Admit: 2013-09-10 | Discharge: 2013-09-10 | Disposition: A | Discharge status: Home / Self Care | Payer: Medicaid Other | Source: Home / Self Care | Attending: Family Medicine | Admitting: Family Medicine

## 2013-09-10 DIAGNOSIS — J069 Acute upper respiratory infection, unspecified: Secondary | ICD-10-CM

## 2013-09-10 MED ORDER — IPRATROPIUM BROMIDE 0.06 % NA SOLN: 2.0000 | Freq: Four times a day (QID) | NASAL | Status: DC

## 2013-09-10 MED ORDER — ALBUTEROL SULFATE HFA 108 (90 BASE) MCG/ACT IN AERS: 1.0000 | INHALATION_SPRAY | Freq: Four times a day (QID) | RESPIRATORY_TRACT | Status: DC | PRN

## 2013-09-10 MED ORDER — BENZONATATE 100 MG PO CAPS: 100.0000 mg | ORAL_CAPSULE | Freq: Three times a day (TID) | ORAL | Status: DC | PRN

## 2013-09-10 NOTE — ED Notes (Signed)
C/o 1 week duration of cough, green/yellow secretions; concern for :"sinus infection". NAD at present

## 2013-09-10 NOTE — ED Provider Notes (Signed)
CSN: [id number]     Arrival date & time 09/10/13  1319 History   First MD Initiated Contact with Patient 09/10/13 1440     Chief Complaint  Patient presents with  . Shortness of Breath   (Consider location/radiation/quality/duration/timing/severity/associated sxs/prior Treatment) HPI Comments: PCP: Dr. Chrissie NoaWilliam @ [facility] [address]   Patient is a 28 y.o. female presenting with URI.  URI Presenting symptoms: congestion, cough and rhinorrhea   Presenting symptoms: no ear pain, no facial pain, no fatigue and no fever   Severity:  Moderate Onset quality:  Gradual Duration:  1 week Progression:  Unchanged Chronicity:  New Associated symptoms: wheezing   Associated symptoms: no arthralgias, no headaches, no myalgias, no neck pain, no sinus pain, no sneezing and no swollen glands   Risk factors comment:  +heavy smoker   Past Medical History  Diagnosis Date  . Depression   . Herpes     negative culture in 1/13  . Gestational diabetes     glyburide  . Anxiety   . Shingles    Past Surgical History  Procedure Laterality Date  . Appendectomy    . Foot surgery     Family History  Problem Relation Age of Onset  . Diabetes Other   . Hypertension Other   . Heart disease Other   . Cancer Other   . COPD Other   . Asthma Other   . Heart attack Other   . Obesity Other   . Hyperlipidemia Other   . Other Neg Hx   . Cancer Mother     lung  . Cancer Maternal Grandmother     lung  . Cancer Father   . Heart attack Maternal Aunt   . Hypertension Maternal Aunt   . Diabetes Maternal Aunt   . Heart disease Maternal Aunt   . Cancer Maternal Grandfather     lung   History  Substance Use Topics  . Smoking status: Current Every Day Smoker -- 0.50 packs/day for 10 years    Types: Cigarettes  . Smokeless tobacco: Never Used  . Alcohol Use: No   OB History   Grav Para Term Preterm Abortions TAB SAB Ect Mult Living   2 2 2       2      Review of Systems   Constitutional: Negative for fever and fatigue.  HENT: Positive for congestion and rhinorrhea. Negative for ear pain and sneezing.   Eyes: Negative.   Respiratory: Positive for cough, chest tightness, shortness of breath and wheezing.   Cardiovascular: Negative.   Gastrointestinal: Negative.   Musculoskeletal: Negative for arthralgias, myalgias and neck pain.  Skin: Negative.   Neurological: Negative for headaches.    Allergies  Review of patient's allergies indicates no known allergies.  Home Medications   Prior to Admission medications   Medication Sig Start Date End Date Taking? Authorizing Provider  ALPRAZolam Prudy Feeler(XANAX) 1 MG tablet Take 1 mg by mouth at bedtime as needed for anxiety.   Yes Historical Provider, MD  citalopram (CELEXA) 10 MG tablet Take 10 mg by mouth daily.   Yes Historical Provider, MD  metFORMIN (GLUCOPHAGE) 500 MG tablet Take by mouth daily with breakfast.   Yes Historical Provider, MD  methocarbamol (ROBAXIN) 500 MG tablet Take 500 mg by mouth 4 (four) times daily.   Yes Historical Provider, MD  pregabalin (LYRICA) 75 MG capsule Take 75 mg by mouth 2 (two) times daily.   Yes Historical Provider, MD  acyclovir (  ZOVIRAX) 400 MG tablet Take 2 tablets (800 mg total) by mouth 5 (five) times daily. 04/06/13   Roxy Horsemanobert Browning, PA-C  antipyrine-benzocaine Lyla Son(AURALGAN) otic solution Place 3 drops into both ears every 2 (two) hours as needed for pain. 06/23/12   Hayden Rasmussenavid Mabe, NP  cephALEXin (KEFLEX) 500 MG capsule Take 1 capsule (500 mg total) by mouth 4 (four) times daily. 04/20/12   Jean RosenthalSusan P Lineberry, NP  methylPREDNISolone (MEDROL DOSEPAK) 4 MG tablet As directed 06/23/12   Hayden Rasmussenavid Mabe, NP   BP 115/79  Pulse 79  Temp(Src) 97.9 F (36.6 C) (Oral)  Resp 17  SpO2 98% Physical Exam  Nursing note and vitals reviewed. Constitutional: She is oriented to person, place, and time. She appears well-developed and well-nourished.  +overweight  HENT:  Head: Normocephalic and  atraumatic.  Right Ear: Hearing, tympanic membrane, external ear and ear canal normal. No mastoid tenderness. No middle ear effusion.  Left Ear: Hearing, tympanic membrane, external ear and ear canal normal. No mastoid tenderness.  No middle ear effusion.  Nose: Nose normal.  Mouth/Throat: Uvula is midline, oropharynx is clear and moist and mucous membranes are normal. No oral lesions. No trismus in the jaw. No uvula swelling. No oropharyngeal exudate.  Eyes: Conjunctivae are normal. Right eye exhibits no discharge. Left eye exhibits no discharge. No scleral icterus.  Neck: Normal range of motion. Neck supple.  Cardiovascular: Normal rate, regular rhythm and normal heart sounds.   Pulmonary/Chest: Effort normal and breath sounds normal. No respiratory distress. She has no wheezes.  Abdominal: Soft. Bowel sounds are normal. She exhibits no distension. There is no tenderness.  Musculoskeletal: Normal range of motion.  Lymphadenopathy:    She has no cervical adenopathy.  Neurological: She is alert and oriented to person, place, and time.  Skin: Skin is warm and dry. No rash noted.  Psychiatric: She has a normal mood and affect. Her behavior is normal.    ED Course  Procedures (including critical care time) Labs Review Labs Reviewed - No data to display  Imaging Review No results found.   MDM   1. URI (upper respiratory infection)   Exam unremarkable. Symptomatic care at home with tessalon, Atrovent nasal spray and albuterol MDI. Patient advised to discontinue smoking.    Jess BartersJennifer Lee SchellsburgPresson, [place] 09/10/13 1455

## 2013-09-10 NOTE — ED Provider Notes (Signed)
Medical screening examination/treatment/procedure(s) were performed by resident physician or non-physician practitioner and as supervising physician I was immediately available for consultation/collaboration.   Charlesetta Milliron DOUGLAS MD.   Mckinna Demars D Lydell Moga, MD 09/10/13 1708 

## 2013-09-10 NOTE — Discharge Instructions (Signed)
Antibiotic Nonuse ° Your caregiver felt that the infection or problem was not one that would be helped with an antibiotic. °Infections may be caused by viruses or bacteria. Only a caregiver can tell which one of these is the likely cause of an illness. A cold is the most common cause of infection in both adults and children. A cold is a virus. Antibiotic treatment will have no effect on a viral infection. Viruses can lead to many lost days of work caring for sick children and many missed days of school. Children may catch as many as 10 "colds" or "flus" per year during which they can be tearful, cranky, and uncomfortable. The goal of treating a virus is aimed at keeping the ill person comfortable. °Antibiotics are medications used to help the body fight bacterial infections. There are relatively few types of bacteria that cause infections but there are hundreds of viruses. While both viruses and bacteria cause infection they are very different types of germs. A viral infection will typically go away by itself within 7 to 10 days. Bacterial infections may spread or get worse without antibiotic treatment. °Examples of bacterial infections are: °· Sore throats (like strep throat or tonsillitis). °· Infection in the lung (pneumonia). °· Ear and skin infections. °Examples of viral infections are: °· Colds or flus. °· Most coughs and bronchitis. °· Sore throats not caused by Strep. °· Runny noses. °It is often best not to take an antibiotic when a viral infection is the cause of the problem. Antibiotics can kill off the helpful bacteria that we have inside our body and allow harmful bacteria to start growing. Antibiotics can cause side effects such as allergies, nausea, and diarrhea without helping to improve the symptoms of the viral infection. Additionally, repeated uses of antibiotics can cause bacteria inside of our body to become resistant. That resistance can be passed onto harmful bacterial. The next time you have  an infection it may be harder to treat if antibiotics are used when they are not needed. Not treating with antibiotics allows our own immune system to develop and take care of infections more efficiently. Also, antibiotics will work better for us when they are prescribed for bacterial infections. °Treatments for a child that is ill may include: °· Give extra fluids throughout the day to stay hydrated. °· Get plenty of rest. °· Only give your child over-the-counter or prescription medicines for pain, discomfort, or fever as directed by your caregiver. °· The use of a cool mist humidifier may help stuffy noses. °· Cold medications if suggested by your caregiver. °Your caregiver may decide to start you on an antibiotic if: °· The problem you were seen for today continues for a longer length of time than expected. °· You develop a secondary bacterial infection. °SEEK MEDICAL CARE IF: °· Fever lasts longer than 5 days. °· Symptoms continue to get worse after 5 to 7 days or become severe. °· Difficulty in breathing develops. °· Signs of dehydration develop (poor drinking, rare urinating, dark colored urine). °· Changes in behavior or worsening tiredness (listlessness or lethargy). °Document Released: 07/10/2001 Document Revised: 07/24/2011 Document Reviewed: 01/06/2009 °ExitCare® Patient Information ©2014 [employer]. ° °Upper Respiratory Infection, Adult °An upper respiratory infection (URI) is also sometimes known as the common cold. The upper respiratory tract includes the nose, sinuses, throat, trachea, and bronchi. Bronchi are the airways leading to the lungs. Most people improve within 1 week, but symptoms can last up to 2 weeks. A residual cough may   last even longer.  °CAUSES °Many different viruses can infect the tissues lining the upper respiratory tract. The tissues become irritated and inflamed and often become very moist. Mucus production is also common. A cold is contagious. You can easily spread the virus  to others by oral contact. This includes kissing, sharing a glass, coughing, or sneezing. Touching your mouth or nose and then touching a surface, which is then touched by another person, can also spread the virus. °SYMPTOMS  °Symptoms typically develop 1 to 3 days after you come in contact with a cold virus. Symptoms vary from person to person. They may include: °· Runny nose. °· Sneezing. °· Nasal congestion. °· Sinus irritation. °· Sore throat. °· Loss of voice (laryngitis). °· Cough. °· Fatigue. °· Muscle aches. °· Loss of appetite. °· Headache. °· Low-grade fever. °DIAGNOSIS  °You might diagnose your own cold based on familiar symptoms, since most people get a cold 2 to 3 times a year. Your caregiver can confirm this based on your exam. Most importantly, your caregiver can check that your symptoms are not due to another disease such as strep throat, sinusitis, pneumonia, asthma, or epiglottitis. Blood tests, throat tests, and X-rays are not necessary to diagnose a common cold, but they may sometimes be helpful in excluding other more serious diseases. Your caregiver will decide if any further tests are required. °RISKS AND COMPLICATIONS  °You may be at risk for a more severe case of the common cold if you smoke cigarettes, have chronic heart disease (such as heart failure) or lung disease (such as asthma), or if you have a weakened immune system. The very young and very old are also at risk for more serious infections. Bacterial sinusitis, middle ear infections, and bacterial pneumonia can complicate the common cold. The common cold can worsen asthma and chronic obstructive pulmonary disease (COPD). Sometimes, these complications can require emergency medical care and may be life-threatening. °PREVENTION  °The best way to protect against getting a cold is to practice good hygiene. Avoid oral or hand contact with people with cold symptoms. Wash your hands often if contact occurs. There is no clear evidence that  vitamin C, vitamin E, echinacea, or exercise reduces the chance of developing a cold. However, it is always recommended to get plenty of rest and practice good nutrition. °TREATMENT  °Treatment is directed at relieving symptoms. There is no cure. Antibiotics are not effective, because the infection is caused by a virus, not by bacteria. Treatment may include: °· Increased fluid intake. Sports drinks offer valuable electrolytes, sugars, and fluids. °· Breathing heated mist or steam (vaporizer or shower). °· Eating chicken soup or other clear broths, and maintaining good nutrition. °· Getting plenty of rest. °· Using gargles or lozenges for comfort. °· Controlling fevers with ibuprofen or acetaminophen as directed by your caregiver. °· Increasing usage of your inhaler if you have asthma. °Zinc gel and zinc lozenges, taken in the first 24 hours of the common cold, can shorten the duration and lessen the severity of symptoms. Pain medicines may help with fever, muscle aches, and throat pain. A variety of non-prescription medicines are available to treat congestion and runny nose. Your caregiver can make recommendations and may suggest nasal or lung inhalers for other symptoms.  °HOME CARE INSTRUCTIONS  °· Only take over-the-counter or prescription medicines for pain, discomfort, or fever as directed by your caregiver. °· Use a warm mist humidifier or inhale steam from a shower to increase air moisture. This may keep   secretions moist and make it easier to breathe. °· Drink enough water and fluids to keep your urine clear or pale yellow. °· Rest as needed. °· Return to work when your temperature has returned to normal or as your caregiver advises. You may need to stay home longer to avoid infecting others. You can also use a face mask and careful hand washing to prevent spread of the virus. °SEEK MEDICAL CARE IF:  °· After the first few days, you feel you are getting worse rather than better. °· You need your caregiver's  advice about medicines to control symptoms. °· You develop chills, worsening shortness of breath, or brown or red sputum. These may be signs of pneumonia. °· You develop yellow or brown nasal discharge or pain in the face, especially when you bend forward. These may be signs of sinusitis. °· You develop a fever, swollen neck glands, pain with swallowing, or white areas in the back of your throat. These may be signs of strep throat. °SEEK IMMEDIATE MEDICAL CARE IF:  °· You have a fever. °· You develop severe or persistent headache, ear pain, sinus pain, or chest pain. °· You develop wheezing, a prolonged cough, cough up blood, or have a change in your usual mucus (if you have chronic lung disease). °· You develop sore muscles or a stiff neck. °Document Released: 10/25/2000 Document Revised: 07/24/2011 Document Reviewed: 09/02/2010 °ExitCare® Patient Information ©2014 [employer]. ° °

## 2013-09-30 ENCOUNTER — Emergency Department: Payer: Self-pay | Admitting: Emergency Medicine

## 2013-11-11 ENCOUNTER — Emergency Department (INDEPENDENT_AMBULATORY_CARE_PROVIDER_SITE_OTHER)
Admission: EM | Admit: 2013-11-11 | Discharge: 2013-11-11 | Disposition: A | Discharge status: Home / Self Care | Payer: Medicaid Other | Source: Home / Self Care | Attending: Family Medicine | Admitting: Family Medicine

## 2013-11-11 ENCOUNTER — Encounter (HOSPITAL_COMMUNITY): Payer: Self-pay | Admitting: Emergency Medicine

## 2013-11-11 DIAGNOSIS — E860 Dehydration: Secondary | ICD-10-CM

## 2013-11-11 DIAGNOSIS — R34 Anuria and oliguria: Secondary | ICD-10-CM

## 2013-11-11 LAB — POCT I-STAT, CHEM 8
BUN: 17 mg/dL (ref 6–23)
CHLORIDE: 101 meq/L (ref 96–112)
Calcium, Ion: 1.11 mmol/L — ABNORMAL LOW (ref 1.12–1.23)
Creatinine, Ser: 0.7 mg/dL (ref 0.50–1.10)
GLUCOSE: 144 mg/dL — AB (ref 70–99)
HCT: 44 % (ref 36.0–46.0)
Hemoglobin: 15 g/dL (ref 12.0–15.0)
POTASSIUM: 3.7 meq/L (ref 3.7–5.3)
Sodium: 140 mEq/L (ref 137–147)
TCO2: 23 mmol/L (ref 0–100)

## 2013-11-11 LAB — POCT PREGNANCY, URINE: Preg Test, Ur: NEGATIVE

## 2013-11-11 LAB — URINALYSIS, ROUTINE W REFLEX MICROSCOPIC
GLUCOSE, UA: NEGATIVE mg/dL
HGB URINE DIPSTICK: NEGATIVE
KETONES UR: NEGATIVE mg/dL
Leukocytes, UA: NEGATIVE
Nitrite: NEGATIVE
PROTEIN: NEGATIVE mg/dL
Specific Gravity, Urine: 1.032 — ABNORMAL HIGH (ref 1.005–1.030)
UROBILINOGEN UA: 1 mg/dL (ref 0.0–1.0)
pH: 5.5 (ref 5.0–8.0)

## 2013-11-11 LAB — POCT URINALYSIS DIP (DEVICE)
Bilirubin Urine: NEGATIVE
Glucose, UA: NEGATIVE mg/dL
KETONES UR: NEGATIVE mg/dL
Leukocytes, UA: NEGATIVE
Nitrite: NEGATIVE
PH: 5.5 (ref 5.0–8.0)
PROTEIN: NEGATIVE mg/dL
Urobilinogen, UA: 0.2 mg/dL (ref 0.0–1.0)

## 2013-11-11 LAB — URINE MICROSCOPIC-ADD ON

## 2013-11-11 MED ORDER — SULFAMETHOXAZOLE-TMP DS 800-160 MG PO TABS: 1.0000 | ORAL_TABLET | Freq: Two times a day (BID) | ORAL | Status: DC

## 2013-11-11 NOTE — Discharge Instructions (Signed)
Dehydration, Adult Dehydration is when you lose more fluids from the body than you take in. Vital organs like the kidneys, brain, and heart cannot function without a proper amount of fluids and salt. Any loss of fluids from the body can cause dehydration.  CAUSES   Vomiting.  Diarrhea.  Excessive sweating.  Excessive urine output.  Fever. SYMPTOMS  Mild dehydration  Thirst.  Dry lips.  Slightly dry mouth. Moderate dehydration  Very dry mouth.  Sunken eyes.  Skin does not bounce back quickly when lightly pinched and released.  Dark urine and decreased urine production.  Decreased tear production.  Headache. Severe dehydration  Very dry mouth.  Extreme thirst.  Rapid, weak pulse (more than 100 beats per minute at rest).  Cold hands and feet.  Not able to sweat in spite of heat and temperature.  Rapid breathing.  Blue lips.  Confusion and lethargy.  Difficulty being awakened.  Minimal urine production.  No tears. DIAGNOSIS  Your caregiver will diagnose dehydration based on your symptoms and your exam. Blood and urine tests will help confirm the diagnosis. The diagnostic evaluation should also identify the cause of dehydration. TREATMENT  Treatment of mild or moderate dehydration can often be done at home by increasing the amount of fluids that you drink. It is best to drink small amounts of fluid more often. Drinking too much at one time can make vomiting worse. Refer to the home care instructions below. Severe dehydration needs to be treated at the hospital where you will probably be given intravenous (IV) fluids that contain water and electrolytes. HOME CARE INSTRUCTIONS   Ask your caregiver about specific rehydration instructions.  Drink enough fluids to keep your urine clear or pale yellow.  Drink small amounts frequently if you have nausea and vomiting.  Eat as you normally do.  Avoid:  Foods or drinks high in sugar.  Carbonated  drinks.  Juice.  Extremely hot or cold fluids.  Drinks with caffeine.  Fatty, greasy foods.  Alcohol.  Tobacco.  Overeating.  Gelatin desserts.  Wash your hands well to avoid spreading bacteria and viruses.  Only take over-the-counter or prescription medicines for pain, discomfort, or fever as directed by your caregiver.  Ask your caregiver if you should continue all prescribed and over-the-counter medicines.  Keep all follow-up appointments with your caregiver. SEEK MEDICAL CARE IF:  You have abdominal pain and it increases or stays in one area (localizes).  You have a rash, stiff neck, or severe headache.  You are irritable, sleepy, or difficult to awaken.  You are weak, dizzy, or extremely thirsty. SEEK IMMEDIATE MEDICAL CARE IF:   You are unable to keep fluids down or you get worse despite treatment.  You have frequent episodes of vomiting or diarrhea.  You have blood or green matter (bile) in your vomit.  You have blood in your stool or your stool looks black and tarry.  You have not urinated in 6 to 8 hours, or you have only urinated a small amount of very dark urine.  You have a fever.  You faint. MAKE SURE YOU:   Understand these instructions.  Will watch your condition.  Will get help right away if you are not doing well or get worse. Document Released: 05/01/2005 Document Revised: 07/24/2011 Document Reviewed: 12/19/2010 ExitCare Patient Information 2015 [employer]. This information is not intended to replace advice given to you by your health care provider. Make sure you discuss any questions you have with your health care   provider.  

## 2013-11-11 NOTE — ED Provider Notes (Signed)
CSN: [id number]     Arrival date & time 11/11/13  1748 History   First MD Initiated Contact with Patient 11/11/13 1819     No chief complaint on file.  (Consider location/radiation/quality/duration/timing/severity/associated sxs/prior Treatment) HPI Comments: 28 year old female presents complaining of low back pain and decreased urine output. Her symptoms began 3 days ago. For the past 3 days she has only urinated twice each day, low volume each time. She says that her urine appears to be dark as well. She has never had this before. She also has pain in her lower back, lower abdomen, and bilateral flanks. No nausea or vomiting. No history of kidney problems. No recent travel or sick contact. She does not take anti-inflammatories. She has a history of type 2 diabetes but is now off of metformin.   Past Medical History  Diagnosis Date  . Depression   . Herpes     negative culture in 1/13  . Gestational diabetes     glyburide  . Anxiety   . Shingles    Past Surgical History  Procedure Laterality Date  . Appendectomy    . Foot surgery     Family History  Problem Relation Age of Onset  . Diabetes Other   . Hypertension Other   . Heart disease Other   . Cancer Other   . COPD Other   . Asthma Other   . Heart attack Other   . Obesity Other   . Hyperlipidemia Other   . Other Neg Hx   . Cancer Mother     lung  . Cancer Maternal Grandmother     lung  . Cancer Father   . Heart attack Maternal Aunt   . Hypertension Maternal Aunt   . Diabetes Maternal Aunt   . Heart disease Maternal Aunt   . Cancer Maternal Grandfather     lung   History  Substance Use Topics  . Smoking status: Current Every Day Smoker -- 0.50 packs/day for 10 years    Types: Cigarettes  . Smokeless tobacco: Never Used  . Alcohol Use: No   OB History   Grav Para Term Preterm Abortions TAB SAB Ect Mult Living   2 2 2       2      Review of Systems  Gastrointestinal: Positive for abdominal pain.   Genitourinary: Positive for flank pain and decreased urine volume. Negative for dysuria, urgency, frequency and hematuria.       Dark urine  Musculoskeletal: Positive for back pain.  All other systems reviewed and are negative.   Allergies  Review of patient's allergies indicates no known allergies.  Home Medications   Prior to Admission medications   Medication Sig Start Date End Date Taking? Authorizing Provider  acyclovir (ZOVIRAX) 400 MG tablet Take 2 tablets (800 mg total) by mouth 5 (five) times daily. 04/06/13   Roxy Horseman, PA-C  albuterol (PROVENTIL HFA;VENTOLIN HFA) 108 (90 BASE) MCG/ACT inhaler Inhale 1-2 puffs into the lungs every 6 (six) hours as needed for wheezing or shortness of breath. 09/10/13   Ardis Rowan, PA  ALPRAZolam Prudy Feeler) 1 MG tablet Take 1 mg by mouth at bedtime as needed for anxiety.    Historical Provider, MD  antipyrine-benzocaine Lyla Son) otic solution Place 3 drops into both ears every 2 (two) hours as needed for pain. 06/23/12   Hayden Rasmussen, NP  benzonatate (TESSALON) 100 MG capsule Take 1 capsule (100 mg total) by mouth 3 (three) times daily as needed for cough.  09/10/13   Ardis RowanJennifer Lee Presson, PA  cephALEXin (KEFLEX) 500 MG capsule Take 1 capsule (500 mg total) by mouth 4 (four) times daily. 04/20/12   Jean RosenthalSusan P Lineberry, NP  citalopram (CELEXA) 10 MG tablet Take 10 mg by mouth daily.    Historical Provider, MD  ipratropium (ATROVENT) 0.06 % nasal spray Place 2 sprays into both nostrils 4 (four) times daily. 09/10/13   Ardis RowanJennifer Lee Presson, PA  metFORMIN (GLUCOPHAGE) 500 MG tablet Take by mouth daily with breakfast.    Historical Provider, MD  methocarbamol (ROBAXIN) 500 MG tablet Take 500 mg by mouth 4 (four) times daily.    Historical Provider, MD  methylPREDNISolone (MEDROL DOSEPAK) 4 MG tablet As directed 06/23/12   Hayden Rasmussenavid Mabe, NP  pregabalin (LYRICA) 75 MG capsule Take 75 mg by mouth 2 (two) times daily.    Historical Provider, MD   sulfamethoxazole-trimethoprim (BACTRIM DS) 800-160 MG per tablet Take 1 tablet by mouth 2 (two) times daily. 11/11/13   Adrian BlackwaterZachary H Javonte Elenes, PA-C   BP 120/73  Pulse 83  Temp(Src) 99.1 F (37.3 C) (Oral)  Resp 16  SpO2 97% Physical Exam  Nursing note and vitals reviewed. Constitutional: She is oriented to person, place, and time. Vital signs are normal. She appears well-developed and well-nourished. No distress.  HENT:  Head: Normocephalic and atraumatic.  Right Ear: External ear normal.  Left Ear: External ear normal.  Nose: Nose normal.  Mouth/Throat: Oropharynx is clear and moist. No oropharyngeal exudate.  Eyes: Conjunctivae are normal. Right eye exhibits no discharge. Left eye exhibits no discharge.  Neck: Normal range of motion. Neck supple.  Cardiovascular: Normal rate, regular rhythm and normal heart sounds.   Pulmonary/Chest: Effort normal and breath sounds normal. No respiratory distress.  Abdominal: Soft. Normal appearance and bowel sounds are normal. She exhibits no mass. There is no hepatosplenomegaly. There is tenderness (minimal suprapubic tenderness) in the suprapubic area. There is CVA tenderness (Mild, equal bilaterally). There is no rigidity, no rebound, no guarding, no tenderness at McBurney's point and negative Murphy's sign. No hernia.  Musculoskeletal:       Lumbar back: She exhibits tenderness (Minimal paraspinous muscular tenderness). She exhibits normal range of motion and no bony tenderness.  Lymphadenopathy:    She has no cervical adenopathy.  Neurological: She is alert and oriented to person, place, and time. She has normal strength. Coordination normal.  Skin: Skin is warm and dry. No rash noted. She is not diaphoretic.  Psychiatric: She has a normal mood and affect. Judgment normal.    ED Course  Procedures (including critical care time) Labs Review Labs Reviewed  URINALYSIS, ROUTINE W REFLEX MICROSCOPIC - Abnormal; Notable for the following:     APPearance TURBID (*)    Specific Gravity, Urine 1.032 (*)    Bilirubin Urine SMALL (*)    All other components within normal limits  URINE MICROSCOPIC-ADD ON - Abnormal; Notable for the following:    Squamous Epithelial / LPF FEW (*)    Bacteria, UA FEW (*)    All other components within normal limits  POCT URINALYSIS DIP (DEVICE) - Abnormal; Notable for the following:    Hgb urine dipstick TRACE (*)    All other components within normal limits  POCT I-STAT, CHEM 8 - Abnormal; Notable for the following:    Glucose, Bld 144 (*)    Calcium, Ion 1.11 (*)    All other components within normal limits  URINE CULTURE  POCT PREGNANCY, URINE    Imaging  Review No results found.   MDM   1. Decreased urination   2. Dehydration    Urine microscopy reveals possible UTI. Further review of the finding of amorphous urates and phosphates seen in the urine shows that this can be sometimes confused with infection. We'll treat with Bactrim and increase fluids. Followup when necessary. Urine culture sent    Meds ordered this encounter  Medications  . sulfamethoxazole-trimethoprim (BACTRIM DS) 800-160 MG per tablet    Sig: Take 1 tablet by mouth 2 (two) times daily.    Dispense:  14 tablet    Refill:  0    Order Specific Question:  Supervising Provider    Answer:  Bradd CanaryKINDL, JAMES D [[id number]]       Graylon GoodZachary H Alysiana Ethridge, PA-C 11/11/13 2056

## 2013-11-11 NOTE — ED Notes (Signed)
Low back pain and noticeable decrease in urine output.  Changes for 3 days.  Reports having 2 episodes of urinating today and small volumes each time

## 2013-11-12 NOTE — ED Provider Notes (Signed)
Medical screening examination/treatment/procedure(s) were performed by a resident physician or non-physician practitioner and as the supervising physician I was immediately available for consultation/collaboration.  Reid Regas, MD    Avrie Kedzierski S Shernell Saldierna, MD 11/12/13 1437 

## 2013-11-13 LAB — URINE CULTURE
CULTURE: NO GROWTH
Colony Count: NO GROWTH

## 2014-02-04 ENCOUNTER — Encounter (HOSPITAL_COMMUNITY): Payer: Self-pay | Admitting: Emergency Medicine

## 2014-02-04 ENCOUNTER — Emergency Department (INDEPENDENT_AMBULATORY_CARE_PROVIDER_SITE_OTHER)
Admission: EM | Admit: 2014-02-04 | Discharge: 2014-02-04 | Disposition: A | Discharge status: Home / Self Care | Payer: Medicaid Other | Source: Home / Self Care | Attending: Family Medicine | Admitting: Family Medicine

## 2014-02-04 DIAGNOSIS — J45901 Unspecified asthma with (acute) exacerbation: Secondary | ICD-10-CM

## 2014-02-04 DIAGNOSIS — E119 Type 2 diabetes mellitus without complications: Secondary | ICD-10-CM

## 2014-02-04 DIAGNOSIS — J069 Acute upper respiratory infection, unspecified: Secondary | ICD-10-CM

## 2014-02-04 LAB — GLUCOSE, CAPILLARY: GLUCOSE-CAPILLARY: 94 mg/dL (ref 70–99)

## 2014-02-04 MED ORDER — FLUTICASONE PROPIONATE 50 MCG/ACT NA SUSP: 2.0000 | Freq: Every day | NASAL | Status: DC

## 2014-02-04 MED ORDER — ALBUTEROL SULFATE (2.5 MG/3ML) 0.083% IN NEBU
INHALATION_SOLUTION | RESPIRATORY_TRACT | Status: AC
  Filled 2014-02-04: qty 3

## 2014-02-04 MED ORDER — METHYLPREDNISOLONE SODIUM SUCC 125 MG IJ SOLR
125.0000 mg | Freq: Once | INTRAMUSCULAR | Status: AC
  Administered 2014-02-04: 125 mg via INTRAMUSCULAR

## 2014-02-04 MED ORDER — METHYLPREDNISOLONE SODIUM SUCC 125 MG IJ SOLR: 125.0000 mg | Freq: Once | INTRAMUSCULAR | Status: DC

## 2014-02-04 MED ORDER — IPRATROPIUM BROMIDE 0.06 % NA SOLN: 2.0000 | Freq: Four times a day (QID) | NASAL | Status: DC

## 2014-02-04 MED ORDER — ALBUTEROL SULFATE (2.5 MG/3ML) 0.083% IN NEBU
2.5000 mg | INHALATION_SOLUTION | Freq: Once | RESPIRATORY_TRACT | Status: AC
  Administered 2014-02-04: 2.5 mg via RESPIRATORY_TRACT

## 2014-02-04 MED ORDER — SPACER/AERO-HOLDING CHAMBERS DEVI: Status: DC

## 2014-02-04 MED ORDER — METHYLPREDNISOLONE SODIUM SUCC 125 MG IJ SOLR
INTRAMUSCULAR | Status: AC
  Filled 2014-02-04: qty 2

## 2014-02-04 MED ORDER — PREDNISONE 50 MG PO TABS: ORAL_TABLET | ORAL | Status: DC

## 2014-02-04 MED ORDER — IPRATROPIUM-ALBUTEROL 0.5-2.5 (3) MG/3ML IN SOLN
3.0000 mL | Freq: Once | RESPIRATORY_TRACT | Status: AC
  Administered 2014-02-04: 3 mL via RESPIRATORY_TRACT

## 2014-02-04 MED ORDER — IPRATROPIUM-ALBUTEROL 0.5-2.5 (3) MG/3ML IN SOLN
RESPIRATORY_TRACT | Status: AC
  Filled 2014-02-04: qty 3

## 2014-02-04 NOTE — Discharge Instructions (Signed)
You are suffering from a viral upper respiratory tract infection which is setting off your asthma Please start the atrovent and flonase, ibuprofen  every 6 hours, nasal saline.  Please use the new albuterol every 4 hours for the next 24 hours Please use the prednisone as directed Please come back if you are not better in 7-10 days or get significantly worse.

## 2014-02-04 NOTE — ED Provider Notes (Addendum)
CSN: [id number]     Arrival date & time 02/04/14  1551 History   First MD Initiated Contact with Patient 02/04/14 1615     Chief Complaint  Patient presents with  . URI   (Consider location/radiation/quality/duration/timing/severity/associated sxs/prior Treatment) HPI  Cold like symptoms 2 days ago: associated w/ nasal congestion, sore throat, sinus pressure. Worse in the morning. Now feeling SOB. Albuterol w/ some improvement. dayquill w/o benefit. Excedrin tension w/ some benefit for HA. Subjective fever. Getting worse.   DM: glucose meter not working. Poor PO intake today.   Past Medical History  Diagnosis Date  . Depression   . Gestational diabetes     glyburide  . Anxiety   . Shingles    Past Surgical History  Procedure Laterality Date  . Appendectomy    . Foot surgery     Family History  Problem Relation Age of Onset  . Diabetes Other   . Hypertension Other   . Heart disease Other   . Cancer Other   . COPD Other   . Asthma Other   . Heart attack Other   . Obesity Other   . Hyperlipidemia Other   . Other Neg Hx   . Cancer Mother     lung  . Cancer Maternal Grandmother     lung  . Cancer Father   . Heart attack Maternal Aunt   . Hypertension Maternal Aunt   . Diabetes Maternal Aunt   . Heart disease Maternal Aunt   . Cancer Maternal Grandfather     lung   History  Substance Use Topics  . Smoking status: Current Every Day Smoker -- 1.50 packs/day for 10 years    Types: Cigarettes  . Smokeless tobacco: Never Used  . Alcohol Use: No   OB History   Grav Para Term Preterm Abortions TAB SAB Ect Mult Living   Review of Systems Per HPI with all other pertinent systems negative.   Allergies  Review of patient's allergies indicates no known allergies.  Home Medications   Prior to Admission medications   Medication Sig Start Date End Date Taking? Authorizing Provider  ALPRAZolam Prudy Feeler) 1 MG tablet Take 1 mg by mouth at bedtime as  needed for anxiety.   Yes Historical Provider, MD  metFORMIN (GLUCOPHAGE) 500 MG tablet Take by mouth daily with breakfast.   Yes Historical Provider, MD  acyclovir (ZOVIRAX) 400 MG tablet Take 2 tablets (800 mg total) by mouth 5 (five) times daily. 04/06/13   Roxy Horseman, PA-C  albuterol (PROVENTIL HFA;VENTOLIN HFA) 108 (90 BASE) MCG/ACT inhaler Inhale 1-2 puffs into the lungs every 6 (six) hours as needed for wheezing or shortness of breath. 09/10/13   Ria Clock, PA  antipyrine-benzocaine Lyla Son) otic solution Place 3 drops into both ears every 2 (two) hours as needed for pain. 06/23/12   Hayden Rasmussen, NP  benzonatate (TESSALON) 100 MG capsule Take 1 capsule (100 mg total) by mouth 3 (three) times daily as needed for cough. 09/10/13   Mathis Fare Presson, PA  cephALEXin (KEFLEX) 500 MG capsule Take 1 capsule (500 mg total) by mouth 4 (four) times daily. 04/20/12   Jean Rosenthal, NP  citalopram (CELEXA) 10 MG tablet Take 10 mg by mouth daily.    Historical Provider, MD  fluticasone (FLONASE) 50 MCG/ACT nasal spray Place 2 sprays into both nostrils daily. 02/04/14   Ozella Rocks, MD  ipratropium (ATROVENT) 0.06 % nasal spray Place 2 sprays into both nostrils 4 (four) times daily. 09/10/13   Mathis Fare Presson, PA  ipratropium (ATROVENT) 0.06 % nasal spray Place 2 sprays into both nostrils 4 (four) times daily. 02/04/14   Ozella Rocks, MD  methocarbamol (ROBAXIN) 500 MG tablet Take 500 mg by mouth 4 (four) times daily.    Historical Provider, MD  methylPREDNISolone (MEDROL DOSEPAK) 4 MG tablet As directed 06/23/12   Hayden Rasmussen, NP  predniSONE (DELTASONE) 50 MG tablet Take daily with breakfast 02/04/14   Ozella Rocks, MD  pregabalin (LYRICA) 75 MG capsule Take 75 mg by mouth 2 (two) times daily.    Historical Provider, MD  Spacer/Aero-Holding Chambers DEVI Use as directed 02/04/14   Ozella Rocks, MD  sulfamethoxazole-trimethoprim (BACTRIM DS) 800-160 MG per tablet Take 1  tablet by mouth 2 (two) times daily. 11/11/13   Adrian Blackwater Baker, PA-C   BP 119/74  Pulse 92  Temp(Src) 98.9 F (37.2 C) (Oral)  Resp 18  SpO2 96%  LMP 02/02/2014 Physical Exam  Constitutional: She is oriented to person, place, and time. She appears well-developed and well-nourished. No distress.  HENT:  Head: Normocephalic and atraumatic.  Nasal congestion. Pharyngeal cobblestoning   Eyes: EOM are normal. Pupils are equal, round, and reactive to light.  Neck: Normal range of motion.  Cardiovascular: Normal rate and normal heart sounds.   No murmur heard. Pulmonary/Chest: Effort normal.  Wheezing throughout w/ mild increased WOB  nml WOB and less wheezing after duoneb and solumedrol  Abdominal: Soft. Bowel sounds are normal.  Musculoskeletal: Normal range of motion.  Neurological: She is alert and oriented to person, place, and time.  Skin: Skin is warm and dry. She is not diaphoretic.  Psychiatric: She has a normal mood and affect. Her behavior is normal.    ED Course  Procedures (including critical care time) Labs Review Labs Reviewed  GLUCOSE, CAPILLARY    Imaging Review No results found.   MDM   1. Asthma exacerbation   2. URI (upper respiratory infection)   3. Type 2 diabetes mellitus without complication     ipratropium-albuterol (DUONEB) 0.5-2.5 (3) MG/3ML nebulizer solution 3 mL 3 mL Nebulization   methylPREDNISolone sodium succinate (SOLU-MEDROL) 125 mg/2 mL injection 125 mg 125 mg Intramuscular     ashtma flare likely from URI and w/o rescue inh at home Refilled albuterol and given spacer URI - likely viral. Flonase, NSAIDs, nasal atrovent, nasal saline, mucinec or mucinexD. Precautions given and all questions answered  Shelly Flatten, MD Family Medicine 02/04/2014, 5:30 PM      Ozella Rocks, MD 02/04/14 1730  Ozella Rocks, MD 03/17/14 [phone]

## 2014-02-04 NOTE — ED Notes (Signed)
C/o cold sx onset 2 days Sx include: chest tightness, congestion, sneezing, productive cough, dyspnea Denies f/v/n/d Taking OTC cold meds w/no relief Alert, no signs of acute distress.

## 2014-02-08 ENCOUNTER — Encounter (HOSPITAL_COMMUNITY): Payer: Self-pay | Admitting: Emergency Medicine

## 2014-02-08 ENCOUNTER — Emergency Department (HOSPITAL_COMMUNITY)
Admission: EM | Admit: 2014-02-08 | Discharge: 2014-02-08 | Disposition: A | Discharge status: Home / Self Care | Payer: Medicaid Other | Source: Home / Self Care | Attending: Family Medicine | Admitting: Family Medicine

## 2014-02-08 DIAGNOSIS — R059 Cough, unspecified: Secondary | ICD-10-CM

## 2014-02-08 DIAGNOSIS — J189 Pneumonia, unspecified organism: Secondary | ICD-10-CM

## 2014-02-08 DIAGNOSIS — R062 Wheezing: Secondary | ICD-10-CM

## 2014-02-08 DIAGNOSIS — R05 Cough: Secondary | ICD-10-CM | POA: Diagnosis not present

## 2014-02-08 MED ORDER — HYDROCODONE-HOMATROPINE 5-1.5 MG/5ML PO SYRP: 5.0000 mL | ORAL_SOLUTION | Freq: Four times a day (QID) | ORAL | Status: DC | PRN

## 2014-02-08 MED ORDER — ALBUTEROL SULFATE HFA 108 (90 BASE) MCG/ACT IN AERS: INHALATION_SPRAY | RESPIRATORY_TRACT | Status: DC

## 2014-02-08 MED ORDER — METHYLPREDNISOLONE ACETATE 40 MG/ML IJ SUSP
INTRAMUSCULAR | Status: AC
  Filled 2014-02-08: qty 10

## 2014-02-08 MED ORDER — METHYLPREDNISOLONE ACETATE 80 MG/ML IJ SUSP
80.0000 mg | Freq: Once | INTRAMUSCULAR | Status: AC
  Administered 2014-02-08: 80 mg via INTRAMUSCULAR

## 2014-02-08 MED ORDER — IPRATROPIUM-ALBUTEROL 0.5-2.5 (3) MG/3ML IN SOLN
3.0000 mL | Freq: Once | RESPIRATORY_TRACT | Status: AC
  Administered 2014-02-08: 3 mL via RESPIRATORY_TRACT

## 2014-02-08 MED ORDER — IPRATROPIUM-ALBUTEROL 0.5-2.5 (3) MG/3ML IN SOLN
RESPIRATORY_TRACT | Status: AC
  Filled 2014-02-08: qty 3

## 2014-02-08 MED ORDER — AZITHROMYCIN 250 MG PO TABS: 250.0000 mg | ORAL_TABLET | Freq: Every day | ORAL | Status: DC

## 2014-02-08 NOTE — ED Provider Notes (Signed)
Medical screening examination/treatment/procedure(s) were performed by non-physician practitioner and as supervising physician I was immediately available for consultation/collaboration.  Leslee Home, M.D.  Reuben Likes, MD 02/08/14 [phone]

## 2014-02-08 NOTE — ED Provider Notes (Signed)
CSN: [id number]     Arrival date & time 02/08/14  1447 History   First MD Initiated Contact with Patient 02/08/14 1503     Chief Complaint  Patient presents with  . Follow-up   (Consider location/radiation/quality/duration/timing/severity/associated sxs/prior Treatment) HPI Comments: Patient presents as a follow up to visit here on 09/23. At that time she was diagnosed with a viral URI and treated symptomatically (see note). However she returns with worsening cough; productive (green), dyspnea, wheeze and worsening fatigue. Subjective fevers. Upper symptoms have improved. She had last dose of Prednisone yesterday without relief in symptoms.   The history is provided by the patient.    Past Medical History  Diagnosis Date  . Depression   . Gestational diabetes     glyburide  . Anxiety   . Shingles    Past Surgical History  Procedure Laterality Date  . Appendectomy    . Foot surgery     Family History  Problem Relation Age of Onset  . Diabetes Other   . Hypertension Other   . Heart disease Other   . Cancer Other   . COPD Other   . Asthma Other   . Heart attack Other   . Obesity Other   . Hyperlipidemia Other   . Other Neg Hx   . Cancer Mother     lung  . Cancer Maternal Grandmother     lung  . Cancer Father   . Heart attack Maternal Aunt   . Hypertension Maternal Aunt   . Diabetes Maternal Aunt   . Heart disease Maternal Aunt   . Cancer Maternal Grandfather     lung   History  Substance Use Topics  . Smoking status: Current Every Day Smoker -- 1.50 packs/day for 10 years    Types: Cigarettes  . Smokeless tobacco: Never Used  . Alcohol Use: No   OB History   Grav Para Term Preterm Abortions TAB SAB Ect Mult Living   Review of Systems  Constitutional: Positive for fever and fatigue. Negative for chills.  HENT: Negative.   Respiratory: Positive for cough, chest tightness, shortness of breath and wheezing. Negative for stridor.    Cardiovascular: Negative.   Musculoskeletal: Negative.   Skin: Negative.   Allergic/Immunologic: Negative.   Psychiatric/Behavioral: Negative.     Allergies  Review of patient's allergies indicates no known allergies.  Home Medications   Prior to Admission medications   Medication Sig Start Date End Date Taking? Authorizing Provider  ALPRAZolam Prudy Feeler) 1 MG tablet Take 1 mg by mouth at bedtime as needed for anxiety.   Yes Historical Provider, MD  benzonatate (TESSALON) 100 MG capsule Take 1 capsule (100 mg total) by mouth 3 (three) times daily as needed for cough. 09/10/13  Yes Mathis Fare Presson, PA  cephALEXin (KEFLEX) 500 MG capsule Take 1 capsule (500 mg total) by mouth 4 (four) times daily. 04/20/12  Yes Jean Rosenthal, NP  fluticasone (FLONASE) 50 MCG/ACT nasal spray Place 2 sprays into both nostrils daily. 02/04/14  Yes Ozella Rocks, MD  ipratropium (ATROVENT) 0.06 % nasal spray Place 2 sprays into both nostrils 4 (four) times daily. 09/10/13  Yes Mathis Fare Presson, PA  ipratropium (ATROVENT) 0.06 % nasal spray Place 2 sprays into both nostrils 4 (four) times daily. 02/04/14  Yes Ozella Rocks, MD  metFORMIN (GLUCOPHAGE) 500 MG tablet Take by mouth daily with breakfast.  Yes Historical Provider, MD  methocarbamol (ROBAXIN) 500 MG tablet Take 500 mg by mouth 4 (four) times daily.   Yes Historical Provider, MD  methylPREDNISolone (MEDROL DOSEPAK) 4 MG tablet As directed 06/23/12  Yes Hayden Rasmussen, NP  predniSONE (DELTASONE) 50 MG tablet Take daily with breakfast 02/04/14  Yes Ozella Rocks, MD  pregabalin (LYRICA) 75 MG capsule Take 75 mg by mouth 2 (two) times daily.   Yes Historical Provider, MD  acyclovir (ZOVIRAX) 400 MG tablet Take 2 tablets (800 mg total) by mouth 5 (five) times daily. 04/06/13   Roxy Horseman, PA-C  albuterol (PROVENTIL HFA;VENTOLIN HFA) 108 (90 BASE) MCG/ACT inhaler 1-2 puffs every 6 hours while awake for the next 36 hours then prn  cough/wheeze/sob 02/08/14   Riki Sheer, PA-C  antipyrine-benzocaine Lyla Son) otic solution Place 3 drops into both ears every 2 (two) hours as needed for pain. 06/23/12   Hayden Rasmussen, NP  azithromycin (ZITHROMAX) 250 MG tablet Take 1 tablet (250 mg total) by mouth daily. Take first 2 tablets together, then 1 every day until finished. 02/08/14   Riki Sheer, PA-C  citalopram (CELEXA) 10 MG tablet Take 10 mg by mouth daily.    Historical Provider, MD  HYDROcodone-homatropine (HYCODAN) 5-1.5 MG/5ML syrup Take 5 mLs by mouth every 6 (six) hours as needed for cough. 02/08/14   Riki Sheer, PA-C  Spacer/Aero-Holding Chambers DEVI Use as directed 02/04/14   Ozella Rocks, MD  sulfamethoxazole-trimethoprim (BACTRIM DS) 800-160 MG per tablet Take 1 tablet by mouth 2 (two) times daily. 11/11/13   Adrian Blackwater Baker, PA-C   BP 128/82  Pulse 79  Temp(Src) 98.2 F (36.8 C) (Oral)  Resp 28  SpO2 96%  LMP 02/02/2014 Physical Exam  Nursing note and vitals reviewed. Constitutional: She is oriented to person, place, and time. She appears well-developed and well-nourished. No distress.  Audible wheeze  HENT:  Head: Normocephalic.  Cardiovascular: Normal rate, regular rhythm and normal heart sounds.   Pulmonary/Chest: She has wheezes. She has no rales.  Audible wheeze throughout, crackles left base Post nebulizer, improved wheeze, crackles remain  Musculoskeletal: Normal range of motion.  Lymphadenopathy:    She has no cervical adenopathy.  Neurological: She is alert and oriented to person, place, and time.  Skin: Skin is warm and dry. She is not diaphoretic.  Psychiatric: Her behavior is normal.    ED Course  Procedures (including critical care time) Labs Review Labs Reviewed - No data to display  Imaging Review No results found.   MDM   1. Cough   2. Community acquired pneumonia   3. Wheeze    Probable pna based on presentation and exam. Treat with antibiotic therapy, inhalers  consistently, Mucinex, steroid injection, fluids and time. F/U if worsens.      Riki Sheer, PA-C 02/08/14 1542

## 2014-02-08 NOTE — Discharge Instructions (Signed)
Cough, Adult ° A cough is a reflex that helps clear your throat and airways. It can help heal the body or may be a reaction to an irritated airway. A cough may only last 2 or 3 weeks (acute) or may last more than 8 weeks (chronic).  °CAUSES °Acute cough: °· Viral or bacterial infections. °Chronic cough: °· Infections. °· Allergies. °· Asthma. °· Post-nasal drip. °· Smoking. °· Heartburn or acid reflux. °· Some medicines. °· Chronic lung problems (COPD). °· Cancer. °SYMPTOMS  °· Cough. °· Fever. °· Chest pain. °· Increased breathing rate. °· High-pitched whistling sound when breathing (wheezing). °· Colored mucus that you cough up (sputum). °TREATMENT  °· A bacterial cough may be treated with antibiotic medicine. °· A viral cough must run its course and will not respond to antibiotics. °· Your caregiver may recommend other treatments if you have a chronic cough. °HOME CARE INSTRUCTIONS  °· Only take over-the-counter or prescription medicines for pain, discomfort, or fever as directed by your caregiver. Use cough suppressants only as directed by your caregiver. °· Use a cold steam vaporizer or humidifier in your bedroom or home to help loosen secretions. °· Sleep in a semi-upright position if your cough is worse at night. °· Rest as needed. °· Stop smoking if you smoke. °SEEK IMMEDIATE MEDICAL CARE IF:  °· You have pus in your sputum. °· Your cough starts to worsen. °· You cannot control your cough with suppressants and are losing sleep. °· You begin coughing up blood. °· You have difficulty breathing. °· You develop pain which is getting worse or is uncontrolled with medicine. °· You have a fever. °MAKE SURE YOU:  °· Understand these instructions. °· Will watch your condition. °· Will get help right away if you are not doing well or get worse. °Document Released: 10/28/2010 Document Revised: 07/24/2011 Document Reviewed: 10/28/2010 °ExitCare® Patient Information ©2015 [employer]. This information is not intended  to replace advice given to you by your health care provider. Make sure you discuss any questions you have with your health care provider. °Pneumonia °Pneumonia is an infection of the lungs.  °CAUSES °Pneumonia may be caused by bacteria or a virus. Usually, these infections are caused by breathing infectious particles into the lungs (respiratory tract). °SIGNS AND SYMPTOMS  °· Cough. °· Fever. °· Chest pain. °· Increased rate of breathing. °· Wheezing. °· Mucus production. °DIAGNOSIS  °If you have the common symptoms of pneumonia, your health care provider will typically confirm the diagnosis with a chest X-ray. The X-ray will show an abnormality in the lung (pulmonary infiltrate) if you have pneumonia. Other tests of your blood, urine, or sputum may be done to find the specific cause of your pneumonia. Your health care provider may also do tests (blood gases or pulse oximetry) to see how well your lungs are working. °TREATMENT  °Some forms of pneumonia may be spread to other people when you cough or sneeze. You may be asked to wear a mask before and during your exam. Pneumonia that is caused by bacteria is treated with antibiotic medicine. Pneumonia that is caused by the influenza virus may be treated with an antiviral medicine. Most other viral infections must run their course. These infections will not respond to antibiotics.  °HOME CARE INSTRUCTIONS  °· Cough suppressants may be used if you are losing too much rest. However, coughing protects you by clearing your lungs. You should avoid using cough suppressants if you can. °· Your health care provider may have   have prescribed medicine if he or she thinks your pneumonia is caused by bacteria or influenza. Finish your medicine even if you start to feel better.  Your health care provider may also prescribe an expectorant. This loosens the mucus to be coughed up.  Take medicines only as directed by your health care provider.  Do not smoke. Smoking is a common  cause of bronchitis and can contribute to pneumonia. If you are a smoker and continue to smoke, your cough may last several weeks after your pneumonia has cleared.  A cold steam vaporizer or humidifier in your room or home may help loosen mucus.  Coughing is often worse at night. Sleeping in a semi-upright position in a recliner or using a couple pillows under your head will help with this.  Get rest as you feel it is needed. Your body will usually let you know when you need to rest. PREVENTION A pneumococcal shot (vaccine) is available to prevent a common bacterial cause of pneumonia. This is usually suggested for:  People over 31 years old.  Patients on chemotherapy.  People with chronic lung problems, such as bronchitis or emphysema.  People with immune system problems. If you are over 65 or have a high risk condition, you may receive the pneumococcal vaccine if you have not received it before. In some countries, a routine influenza vaccine is also recommended. This vaccine can help prevent some cases of pneumonia.You may be offered the influenza vaccine as part of your care. If you smoke, it is time to quit. You may receive instructions on how to stop smoking. Your health care provider can provide medicines and counseling to help you quit. SEEK MEDICAL CARE IF: You have a fever. SEEK IMMEDIATE MEDICAL CARE IF:   Your illness becomes worse. This is especially true if you are elderly or weakened from any other disease.  You cannot control your cough with suppressants and are losing sleep.  You begin coughing up blood.  You develop pain which is getting worse or is uncontrolled with medicines.  Any of the symptoms which initially brought you in for treatment are getting worse rather than better.  You develop shortness of breath or chest pain. MAKE SURE YOU:   Understand these instructions.  Will watch your condition.  Will get help right away if you are not doing well or get  worse. Document Released: 05/01/2005 Document Revised: 09/15/2013 Document Reviewed: 07/21/2010 [facility] Patient Information 2015 [place], [place]. This information is not intended to replace advice given to you by your health care provider. Make sure you discuss any questions you have with your health care provider.     Continue the Mucinex, fluids. Use inhalers every 6 hours while awake for 36 hours. TAke all of antibiotics. Cough could linger, but follow up if feels worse.

## 2014-02-08 NOTE — ED Notes (Signed)
Here for follow up from 9/23.  States still taking meds as prescribed.  Not getting any better and having sob.  Productive.  Hot cold chills.    Denies n/v/d

## 2014-03-16 ENCOUNTER — Encounter (HOSPITAL_COMMUNITY): Payer: Self-pay | Admitting: Emergency Medicine

## 2014-04-19 ENCOUNTER — Encounter (HOSPITAL_COMMUNITY): Payer: Self-pay | Admitting: *Deleted

## 2014-04-19 ENCOUNTER — Emergency Department (HOSPITAL_COMMUNITY)
Admission: EM | Admit: 2014-04-19 | Discharge: 2014-04-20 | Discharge status: Against Medical Advice | Payer: Medicaid Other | Attending: Emergency Medicine | Admitting: Emergency Medicine

## 2014-04-19 DIAGNOSIS — Z720 Tobacco use: Secondary | ICD-10-CM | POA: Diagnosis not present

## 2014-04-19 DIAGNOSIS — M545 Low back pain: Secondary | ICD-10-CM | POA: Diagnosis not present

## 2014-04-19 DIAGNOSIS — R05 Cough: Secondary | ICD-10-CM | POA: Insufficient documentation

## 2014-04-19 NOTE — ED Notes (Signed)
Pt in c/o lower back pain that is radiating down her left leg, history of same, also reports cough for the last few days, denies fever, no distress noted, pt is pregnant, unsure how far along, denies any pregnancy related complaints

## 2014-05-15 NOTE — L&D Delivery Note (Signed)
Delivery Note  I was called to attend this delivery d/t rapid progression. After one push, at 2131  a viable female was delivered via  (Presentation:LOA ).  APGAR: 9/9 ; weight pending.  After 3 minutes, the cord was clamped and cut. 40 units of pitocin diluted in 1000cc LR was infused rapidly IV.  The placenta separated spontaneously and delivered via CCT and maternal pushing effort.  It was inspected and appears to be intact with a 3 VC.  There were the following complications:   Anesthesia: Epidural  Episiotomy:none  Lacerations: none Suture Repair: n/a Est. Blood Loss (mL): <500 cc (measurement pending)  Mom to postpartum.  Baby to Couplet care / Skin to Skin.  Dr. Jackelyn KnifeMeisinger arrived shortly after the placenta was delivered and assumed care for collecting the cord blood for donation.   Jamie Palmer,Jamie Palmer

## 2014-05-19 LAB — OB RESULTS CONSOLE RUBELLA ANTIBODY, IGM: Rubella: IMMUNE

## 2014-05-19 LAB — OB RESULTS CONSOLE RPR: RPR: NONREACTIVE

## 2014-05-19 LAB — OB RESULTS CONSOLE GC/CHLAMYDIA
Chlamydia: NEGATIVE
GC PROBE AMP, GENITAL: NEGATIVE

## 2014-05-19 LAB — OB RESULTS CONSOLE HEPATITIS B SURFACE ANTIGEN: Hepatitis B Surface Ag: NEGATIVE

## 2014-05-19 LAB — OB RESULTS CONSOLE ANTIBODY SCREEN: Antibody Screen: NEGATIVE

## 2014-05-19 LAB — OB RESULTS CONSOLE HIV ANTIBODY (ROUTINE TESTING): HIV: NONREACTIVE

## 2014-05-19 LAB — OB RESULTS CONSOLE ABO/RH: RH TYPE: POSITIVE

## 2014-05-21 LAB — OB RESULTS CONSOLE HIV ANTIBODY (ROUTINE TESTING): HIV: NONREACTIVE

## 2014-05-21 LAB — OB RESULTS CONSOLE RPR: RPR: NONREACTIVE

## 2014-08-12 ENCOUNTER — Ambulatory Visit: Payer: Medicaid Other | Attending: Specialist | Admitting: Physical Therapy

## 2014-08-12 ENCOUNTER — Encounter: Payer: Self-pay | Admitting: Physical Therapy

## 2014-08-12 DIAGNOSIS — M79605 Pain in left leg: Secondary | ICD-10-CM

## 2014-08-12 DIAGNOSIS — M533 Sacrococcygeal disorders, not elsewhere classified: Secondary | ICD-10-CM | POA: Diagnosis not present

## 2014-08-12 DIAGNOSIS — M545 Low back pain, unspecified: Secondary | ICD-10-CM

## 2014-08-12 NOTE — Therapy (Addendum)
[place], [place], [address] Phone: [phone]   Fax:  [phone]  Physical Therapy Evaluation  Patient Details  Name: Jamie Palmer MRN: [id number] Date of Birth: 1985-09-09 Referring Provider:  Harvie Junior, MD  Encounter Date: 08/12/2014    Past Medical History  Diagnosis Date  . Depression   . Gestational diabetes     glyburide  . Anxiety   . Shingles   . ADHD (attention deficit hyperactivity disorder)   . Asthma     Uses inhaler prn    Past Surgical History  Procedure Laterality Date  . Appendectomy    . Foot surgery    . Tubal ligation Bilateral 11/27/2014    Procedure: POST PARTUM TUBAL LIGATION;  Surgeon: Cheri Fowler, MD;  Location: [facility];  Service: Gynecology;  Laterality: Bilateral;    There were no vitals filed for this visit.  Visit Diagnosis:  LBP radiating to left leg - Plan: PT plan of care cert/re-cert  SI (sacroiliac) pain - Plan: PT plan of care cert/re-cert                                  PT Long Term Goals - 08/12/14 1431    PT LONG TERM GOAL #1   Title independent with HEP   Time 6   Period Weeks   Status New   PT LONG TERM GOAL #2   Title decrease pain 50%   Time 6   Period Weeks   Status New              PHYSICAL THERAPY DISCHARGE SUMMARY  Visits from Start of Care: 1   Plan: Patient agrees to discharge.  Patient goals were not met. Patient is being discharged due to not returning since the last visit.  ?????       Problem List Patient Active Problem List   Diagnosis Date Noted  . Diabetes mellitus during pregnancy in third trimester 11/26/2014  . Gestational diabetes 03/29/2012  . SVD (spontaneous vaginal delivery) 03/29/2012    Sumner Boast, PT 12/23/2014, 8:26 AM  [facility] [place], [place],  [address] Phone: [phone]   Fax:  [phone]

## 2014-08-25 ENCOUNTER — Inpatient Hospital Stay (HOSPITAL_COMMUNITY)
Admission: AD | Admit: 2014-08-25 | Discharge: 2014-08-25 | Disposition: A | Discharge status: Home / Self Care | Payer: Medicaid Other | Source: Ambulatory Visit | Attending: Obstetrics and Gynecology | Admitting: Obstetrics and Gynecology

## 2014-08-25 ENCOUNTER — Encounter (HOSPITAL_COMMUNITY): Payer: Self-pay | Admitting: *Deleted

## 2014-08-25 DIAGNOSIS — J45901 Unspecified asthma with (acute) exacerbation: Secondary | ICD-10-CM

## 2014-08-25 DIAGNOSIS — O99512 Diseases of the respiratory system complicating pregnancy, second trimester: Secondary | ICD-10-CM | POA: Insufficient documentation

## 2014-08-25 DIAGNOSIS — R05 Cough: Secondary | ICD-10-CM | POA: Diagnosis present

## 2014-08-25 DIAGNOSIS — F17210 Nicotine dependence, cigarettes, uncomplicated: Secondary | ICD-10-CM | POA: Insufficient documentation

## 2014-08-25 DIAGNOSIS — Z3A26 26 weeks gestation of pregnancy: Secondary | ICD-10-CM | POA: Insufficient documentation

## 2014-08-25 DIAGNOSIS — O99332 Smoking (tobacco) complicating pregnancy, second trimester: Secondary | ICD-10-CM | POA: Insufficient documentation

## 2014-08-25 DIAGNOSIS — J069 Acute upper respiratory infection, unspecified: Secondary | ICD-10-CM

## 2014-08-25 LAB — AMNISURE RUPTURE OF MEMBRANE (ROM) NOT AT ARMC: Amnisure ROM: NEGATIVE

## 2014-08-25 MED ORDER — IPRATROPIUM-ALBUTEROL 0.5-2.5 (3) MG/3ML IN SOLN
3.0000 mL | Freq: Once | RESPIRATORY_TRACT | Status: AC
  Administered 2014-08-25: 3 mL via RESPIRATORY_TRACT
  Filled 2014-08-25: qty 3

## 2014-08-25 MED ORDER — IPRATROPIUM-ALBUTEROL 0.5-2.5 (3) MG/3ML IN SOLN
3.0000 mL | RESPIRATORY_TRACT | Status: DC
  Filled 2014-08-25 (×5): qty 3

## 2014-08-25 MED ORDER — IPRATROPIUM BROMIDE 0.02 % IN SOLN: 0.5000 mg | Freq: Once | RESPIRATORY_TRACT | Status: DC

## 2014-08-25 MED ORDER — ALBUTEROL SULFATE (2.5 MG/3ML) 0.083% IN NEBU: 2.5000 mg | INHALATION_SOLUTION | Freq: Once | RESPIRATORY_TRACT | Status: DC

## 2014-08-25 MED ORDER — ALBUTEROL SULFATE HFA 108 (90 BASE) MCG/ACT IN AERS: INHALATION_SPRAY | RESPIRATORY_TRACT | Status: DC

## 2014-08-25 NOTE — Discharge Instructions (Signed)
Upper Respiratory Infection, Adult An upper respiratory infection (URI) is also known as the common cold. It is often caused by a type of germ (virus). Colds are easily spread (contagious). You can pass it to others by kissing, coughing, sneezing, or drinking out of the same glass. Usually, you get better in 1 or 2 weeks.  HOME CARE   Only take medicine as told by your doctor.  Use a warm mist humidifier or breathe in steam from a hot shower.  Drink enough water and fluids to keep your pee (urine) clear or pale yellow.  Get plenty of rest.  Return to work when your temperature is back to normal or as told by your doctor. You may use a face mask and wash your hands to stop your cold from spreading. GET HELP RIGHT AWAY IF:   After the first few days, you feel you are getting worse.  You have questions about your medicine.  You have chills, shortness of breath, or brown or red spit (mucus).  You have yellow or brown snot (nasal discharge) or pain in the face, especially when you bend forward.  You have a fever, puffy (swollen) neck, pain when you swallow, or white spots in the back of your throat.  You have a bad headache, ear pain, sinus pain, or chest pain.  You have a high-pitched whistling sound when you breathe in and out (wheezing).  You have a lasting cough or cough up blood.  You have sore muscles or a stiff neck. MAKE SURE YOU:   Understand these instructions.  Will watch your condition.  Will get help right away if you are not doing well or get worse. Document Released: 10/18/2007 Document Revised: 07/24/2011 Document Reviewed: 08/06/2013 [employer] Patient Information 2015 [employer]. This information is not intended to replace advice given to you by your health care provider. Make sure you discuss any questions you have with your health care provider.  

## 2014-08-25 NOTE — MAU Note (Signed)
Pt. Urine in lab 

## 2014-08-25 NOTE — MAU Note (Signed)
Pt states that she continues to leak a small amount of clear fluid with some yellow tint to it. Pt denies bleeding and discharge and states that baby is active.

## 2014-08-25 NOTE — MAU Note (Signed)
Respiratory in for breathing treatment.

## 2014-08-25 NOTE — MAU Note (Signed)
Pt reports she was seen in the MD's office yesterday and told them that she rt ear pain, cough and sore throat. Denies fever. States every time she coughs and sometimes when she is just sitting she has some fluid leaking out.

## 2014-08-25 NOTE — MAU Provider Note (Signed)
History     CSN: [id number]  Arrival date and time: 08/25/14 5284   First Provider Initiated Contact with Patient 08/25/14 2036      No chief complaint on file.  HPI  Jamie Palmer is a 29 y.o. G3P2002 at 29 y.o. who presents to MAU today with complaint of cough, sore throat, ear pain and sinus pressure x 3 days. The patient states cough is productive with yellow sputum. She denies fever, abdominal pain or contractions. She states possible LOF with coughing. She reports good fetal movement and denies complications with the pregnancy.   OB History    Gravida Para Term Preterm AB TAB SAB Ectopic Multiple Living   Past Medical History  Diagnosis Date  . Depression   . Gestational diabetes     glyburide  . Anxiety   . Shingles     Past Surgical History  Procedure Laterality Date  . Appendectomy    . Foot surgery      Family History  Problem Relation Age of Onset  . Diabetes Other   . Hypertension Other   . Heart disease Other   . Cancer Other   . COPD Other   . Asthma Other   . Heart attack Other   . Obesity Other   . Hyperlipidemia Other   . Other Neg Hx   . Cancer Mother     lung  . Cancer Maternal Grandmother     lung  . Cancer Father   . Heart attack Maternal Aunt   . Hypertension Maternal Aunt   . Diabetes Maternal Aunt   . Heart disease Maternal Aunt   . Cancer Maternal Grandfather     lung    History  Substance Use Topics  . Smoking status: Current Every Day Smoker -- 1.00 packs/day for 10 years    Types: Cigarettes  . Smokeless tobacco: Never Used  . Alcohol Use: No    Allergies: No Known Allergies  No prescriptions prior to admission    Review of Systems  Constitutional: Negative for fever and malaise/fatigue.  HENT: Positive for congestion, ear pain and sore throat.   Respiratory: Positive for cough, sputum production and shortness of breath.   Cardiovascular: Negative for chest pain.  Gastrointestinal:  Negative for abdominal pain.  Genitourinary:       + LOF   Physical Exam   Blood pressure 136/68, pulse 96, temperature 98 F (36.7 C), temperature source Oral, resp. rate 18, height  (1.676 m), weight 210 lb (95.255 kg), last menstrual period 02/02/2014, SpO2 99 %.  Physical Exam  Constitutional: She is oriented to person, place, and time. She appears well-developed and well-nourished. No distress.  HENT:  Head: Normocephalic.  Right Ear: Tympanic membrane, external ear and ear canal normal. Tympanic membrane is not erythematous, not retracted and not bulging.  Left Ear: Tympanic membrane, external ear and ear canal normal. Tympanic membrane is not erythematous, not retracted and not bulging.  Nose: Rhinorrhea present. No mucosal edema. Right sinus exhibits no maxillary sinus tenderness and no frontal sinus tenderness. Left sinus exhibits no maxillary sinus tenderness and no frontal sinus tenderness.  Mouth/Throat: Mucous membranes are normal. Posterior oropharyngeal erythema present. No oropharyngeal exudate, posterior oropharyngeal edema or tonsillar abscesses.  Cardiovascular: Normal rate, regular rhythm and normal heart sounds.   Respiratory: Effort normal. No respiratory distress. She has wheezes (diffuse mild wheezes noted, improvement after  nebulizer treatment). She has no rales. She exhibits no tenderness.  GI: Soft. She exhibits no distension and no mass. There is no tenderness. There is no rebound and no guarding.  Lymphadenopathy:       Head (right side): No submental, no submandibular and no tonsillar adenopathy present.       Head (left side): No submental, no submandibular and no tonsillar adenopathy present.    She has no cervical adenopathy.  Neurological: She is alert and oriented to person, place, and time.  Skin: Skin is warm and dry. No erythema.  Psychiatric: She has a normal mood and affect.   Results for orders placed or performed during the hospital encounter  of 08/25/14 (from the past 24 hour(s))  Amnisure rupture of membrane (rom)     Status: None   Collection Time: 08/25/14  9:20 PM  Result Value Ref Range   Amnisure ROM NEGATIVE     Fetal Monitoring: Baseline: 145 bpm, moderate variability, + accelerations, no decelerations - multiple areas where maternal HR is traced due to patient coughing and movement Contractions: None  MAU Course  Procedures None  MDM Discussed with Dr. Senaida Oresichardson. Agrees with plan to provider nebulizer treatment and Rx for inhaler, list of OTC medications for symptomatic management today. Check amnisure.  Albuterol and atrovent nebulizer given. Patient notes significant improvement. Wheezing has improved Amnisure negative. Leaking is most likely urine or discharge with coughing.   Assessment and Plan  A: SIUP at [id number] Viral URI Asthma exacerbation  P: Discharge home Rx for albuterol inhaler to be used PRN for SOB, wheezing List of OTC medications safe in pregnancy given Patient [facility] or sooner PRN Warning signs for worsening condition discussed Patient may return to MAU as needed or if her condition were to change or worsen   Jamie LowensteinJulie N Wenzel, PA-C  08/26/2014, 3:47 AM

## 2014-10-26 LAB — OB RESULTS CONSOLE GBS: STREP GROUP B AG: POSITIVE

## 2014-11-07 ENCOUNTER — Inpatient Hospital Stay (HOSPITAL_COMMUNITY)
Admission: AD | Admit: 2014-11-07 | Discharge: 2014-11-07 | Disposition: A | Discharge status: Home / Self Care | Payer: Medicaid Other | Source: Ambulatory Visit | Attending: Obstetrics and Gynecology | Admitting: Obstetrics and Gynecology

## 2014-11-07 ENCOUNTER — Encounter (HOSPITAL_COMMUNITY): Payer: Self-pay | Admitting: *Deleted

## 2014-11-07 DIAGNOSIS — Z3A37 37 weeks gestation of pregnancy: Secondary | ICD-10-CM | POA: Diagnosis not present

## 2014-11-07 DIAGNOSIS — Z3493 Encounter for supervision of normal pregnancy, unspecified, third trimester: Secondary | ICD-10-CM | POA: Diagnosis not present

## 2014-11-07 NOTE — Discharge Instructions (Signed)

## 2014-11-07 NOTE — MAU Note (Signed)
Patient presents at [redacted] weeks gestation with c/o of cramping X 11/2 hours. Fetus active. Denies bleeding but states she has 'normal discharge'.

## 2014-11-16 ENCOUNTER — Inpatient Hospital Stay (HOSPITAL_COMMUNITY)
Admission: AD | Admit: 2014-11-16 | Discharge: 2014-11-16 | Disposition: A | Discharge status: Home / Self Care | Payer: Medicaid Other | Source: Ambulatory Visit | Attending: Obstetrics and Gynecology | Admitting: Obstetrics and Gynecology

## 2014-11-16 DIAGNOSIS — Z3A00 Weeks of gestation of pregnancy not specified: Secondary | ICD-10-CM | POA: Diagnosis not present

## 2014-11-16 DIAGNOSIS — O24419 Gestational diabetes mellitus in pregnancy, unspecified control: Secondary | ICD-10-CM | POA: Diagnosis present

## 2014-11-16 LAB — GLUCOSE, CAPILLARY: Glucose-Capillary: 128 mg/dL — ABNORMAL HIGH (ref 65–99)

## 2014-11-16 NOTE — MAU Provider Note (Signed)
NST 115 R, good var, category 1

## 2014-11-23 ENCOUNTER — Telehealth (HOSPITAL_COMMUNITY): Payer: Self-pay | Admitting: *Deleted

## 2014-11-23 ENCOUNTER — Encounter (HOSPITAL_COMMUNITY): Payer: Self-pay | Admitting: *Deleted

## 2014-11-23 NOTE — Telephone Encounter (Signed)
Preadmission screen  

## 2014-11-26 ENCOUNTER — Inpatient Hospital Stay (HOSPITAL_COMMUNITY): Payer: Medicaid Other | Admitting: Anesthesiology

## 2014-11-26 ENCOUNTER — Inpatient Hospital Stay (HOSPITAL_COMMUNITY)
Admission: AD | Admit: 2014-11-26 | Discharge: 2014-11-28 | DRG: 767 | Disposition: A | Discharge status: Home / Self Care | Payer: Medicaid Other | Source: Ambulatory Visit | Attending: Obstetrics and Gynecology | Admitting: Obstetrics and Gynecology

## 2014-11-26 ENCOUNTER — Encounter (HOSPITAL_COMMUNITY): Payer: Self-pay | Admitting: *Deleted

## 2014-11-26 ENCOUNTER — Inpatient Hospital Stay (HOSPITAL_COMMUNITY)
Admission: RE | Admit: 2014-11-26 | Discharge: 2014-11-26 | Disposition: A | Discharge status: Home / Self Care | Payer: Medicaid Other | Source: Ambulatory Visit | Attending: Obstetrics and Gynecology | Admitting: Obstetrics and Gynecology

## 2014-11-26 DIAGNOSIS — Z833 Family history of diabetes mellitus: Secondary | ICD-10-CM | POA: Diagnosis not present

## 2014-11-26 DIAGNOSIS — Z302 Encounter for sterilization: Secondary | ICD-10-CM

## 2014-11-26 DIAGNOSIS — O2412 Pre-existing diabetes mellitus, type 2, in childbirth: Principal | ICD-10-CM | POA: Diagnosis present

## 2014-11-26 DIAGNOSIS — O99334 Smoking (tobacco) complicating childbirth: Secondary | ICD-10-CM | POA: Diagnosis present

## 2014-11-26 DIAGNOSIS — Z3A39 39 weeks gestation of pregnancy: Secondary | ICD-10-CM

## 2014-11-26 DIAGNOSIS — O24113 Pre-existing diabetes mellitus, type 2, in pregnancy, third trimester: Secondary | ICD-10-CM | POA: Diagnosis present

## 2014-11-26 DIAGNOSIS — O9952 Diseases of the respiratory system complicating childbirth: Secondary | ICD-10-CM | POA: Diagnosis present

## 2014-11-26 DIAGNOSIS — Z8249 Family history of ischemic heart disease and other diseases of the circulatory system: Secondary | ICD-10-CM

## 2014-11-26 DIAGNOSIS — O24913 Unspecified diabetes mellitus in pregnancy, third trimester: Secondary | ICD-10-CM | POA: Diagnosis present

## 2014-11-26 DIAGNOSIS — E119 Type 2 diabetes mellitus without complications: Secondary | ICD-10-CM | POA: Diagnosis present

## 2014-11-26 DIAGNOSIS — F17210 Nicotine dependence, cigarettes, uncomplicated: Secondary | ICD-10-CM | POA: Diagnosis present

## 2014-11-26 DIAGNOSIS — J45909 Unspecified asthma, uncomplicated: Secondary | ICD-10-CM | POA: Diagnosis present

## 2014-11-26 DIAGNOSIS — O99824 Streptococcus B carrier state complicating childbirth: Secondary | ICD-10-CM | POA: Diagnosis present

## 2014-11-26 HISTORY — DX: Unspecified asthma, uncomplicated: J45.909

## 2014-11-26 LAB — CBC
HCT: 38.5 % (ref 36.0–46.0)
HEMOGLOBIN: 12.9 g/dL (ref 12.0–15.0)
MCH: 31.5 pg (ref 26.0–34.0)
MCHC: 33.5 g/dL (ref 30.0–36.0)
MCV: 94.1 fL (ref 78.0–100.0)
Platelets: 270 10*3/uL (ref 150–400)
RBC: 4.09 MIL/uL (ref 3.87–5.11)
RDW: 14.3 % (ref 11.5–15.5)
WBC: 13.9 10*3/uL — AB (ref 4.0–10.5)

## 2014-11-26 LAB — GLUCOSE, CAPILLARY
GLUCOSE-CAPILLARY: 64 mg/dL — AB (ref 65–99)
GLUCOSE-CAPILLARY: 108 mg/dL — AB (ref 65–99)
GLUCOSE-CAPILLARY: 110 mg/dL — AB (ref 65–99)
Glucose-Capillary: 62 mg/dL — ABNORMAL LOW (ref 65–99)

## 2014-11-26 LAB — RPR: RPR Ser Ql: NONREACTIVE

## 2014-11-26 LAB — TYPE AND SCREEN
ABO/RH(D): A POS
Antibody Screen: NEGATIVE

## 2014-11-26 MED ORDER — PENICILLIN G POTASSIUM 5000000 UNITS IJ SOLR
2.5000 10*6.[IU] | INTRAVENOUS | Status: DC
  Administered 2014-11-26 (×3): 2.5 10*6.[IU] via INTRAVENOUS
  Filled 2014-11-26 (×8): qty 2.5

## 2014-11-26 MED ORDER — OXYTOCIN 40 UNITS IN LACTATED RINGERS INFUSION - SIMPLE MED
1.0000 m[IU]/min | INTRAVENOUS | Status: DC
  Administered 2014-11-26: 2 m[IU]/min via INTRAVENOUS
  Filled 2014-11-26: qty 1000

## 2014-11-26 MED ORDER — OXYTOCIN 40 UNITS IN LACTATED RINGERS INFUSION - SIMPLE MED: 62.5000 mL/h | INTRAVENOUS | Status: DC

## 2014-11-26 MED ORDER — LIDOCAINE HCL (PF) 1 % IJ SOLN
INTRAMUSCULAR | Status: DC | PRN
  Administered 2014-11-26 (×2): 8 mL via EPIDURAL

## 2014-11-26 MED ORDER — OXYCODONE-ACETAMINOPHEN 5-325 MG PO TABS: 1.0000 | ORAL_TABLET | ORAL | Status: DC | PRN

## 2014-11-26 MED ORDER — LACTATED RINGERS IV SOLN
500.0000 mL | INTRAVENOUS | Status: DC | PRN
Start: 2014-11-26 — End: 2014-11-27
  Administered 2014-11-26: 1000 mL via INTRAVENOUS

## 2014-11-26 MED ORDER — EPHEDRINE 5 MG/ML INJ
10.0000 mg | INTRAVENOUS | Status: DC | PRN
Start: 2014-11-26 — End: 2014-11-27
  Filled 2014-11-26: qty 2

## 2014-11-26 MED ORDER — TERBUTALINE SULFATE 1 MG/ML IJ SOLN: 0.2500 mg | Freq: Once | INTRAMUSCULAR | Status: AC | PRN

## 2014-11-26 MED ORDER — BUTORPHANOL TARTRATE 1 MG/ML IJ SOLN: 1.0000 mg | INTRAMUSCULAR | Status: DC | PRN

## 2014-11-26 MED ORDER — OXYTOCIN BOLUS FROM INFUSION
500.0000 mL | INTRAVENOUS | Status: DC
  Administered 2014-11-26: 500 mL via INTRAVENOUS

## 2014-11-26 MED ORDER — LIDOCAINE HCL (PF) 1 % IJ SOLN
30.0000 mL | INTRAMUSCULAR | Status: DC | PRN
Start: 2014-11-26 — End: 2014-11-27
  Filled 2014-11-26: qty 30

## 2014-11-26 MED ORDER — CITRIC ACID-SODIUM CITRATE 334-500 MG/5ML PO SOLN: 30.0000 mL | ORAL | Status: DC | PRN

## 2014-11-26 MED ORDER — LACTATED RINGERS IV SOLN
INTRAVENOUS | Status: DC
  Administered 2014-11-26 (×2): via INTRAVENOUS

## 2014-11-26 MED ORDER — ONDANSETRON HCL 4 MG/2ML IJ SOLN: 4.0000 mg | Freq: Four times a day (QID) | INTRAMUSCULAR | Status: DC | PRN

## 2014-11-26 MED ORDER — DIPHENHYDRAMINE HCL 50 MG/ML IJ SOLN: 12.5000 mg | INTRAMUSCULAR | Status: DC | PRN

## 2014-11-26 MED ORDER — PHENYLEPHRINE 40 MCG/ML (10ML) SYRINGE FOR IV PUSH (FOR BLOOD PRESSURE SUPPORT)
80.0000 ug | PREFILLED_SYRINGE | INTRAVENOUS | Status: DC | PRN
Start: 2014-11-26 — End: 2014-11-27
  Filled 2014-11-26: qty 2
  Filled 2014-11-26: qty 20

## 2014-11-26 MED ORDER — PENICILLIN G POTASSIUM 5000000 UNITS IJ SOLR
5.0000 10*6.[IU] | Freq: Once | INTRAVENOUS | Status: AC
  Administered 2014-11-26: 5 10*6.[IU] via INTRAVENOUS
  Filled 2014-11-26: qty 5

## 2014-11-26 MED ORDER — FENTANYL 2.5 MCG/ML BUPIVACAINE 1/10 % EPIDURAL INFUSION (WH - ANES)
14.0000 mL/h | INTRAMUSCULAR | Status: DC | PRN
  Administered 2014-11-26: 14 mL/h via EPIDURAL
  Filled 2014-11-26: qty 125

## 2014-11-26 MED ORDER — IBUPROFEN 600 MG PO TABS
600.0000 mg | ORAL_TABLET | Freq: Four times a day (QID) | ORAL | Status: DC
  Administered 2014-11-26 – 2014-11-28 (×6): 600 mg via ORAL
  Filled 2014-11-26 (×6): qty 1

## 2014-11-26 MED ORDER — OXYCODONE-ACETAMINOPHEN 5-325 MG PO TABS: 2.0000 | ORAL_TABLET | ORAL | Status: DC | PRN

## 2014-11-26 NOTE — H&P (Signed)
Jamie Palmer is a 29 y.o. female, G3P2002, EGA 39+ weeks with EDC 7-19  presenting for induction.  Prenatal care complicated by Type II DM with fair control with metformin, reactive NSTs.  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  Maternal Medical History:  Fetal activity: Perceived fetal activity is normal.    Prenatal Complications - Diabetes: type 2. Diabetes is managed by oral agent (monotherapy).      OB History    Gravida Para Term Preterm AB TAB SAB Ectopic Multiple Living   3 2 2  0 0 0 0 0 0 2     Past Medical History  Diagnosis Date  . Depression   . Gestational diabetes     glyburide  . Anxiety   . Shingles   . ADHD (attention deficit hyperactivity disorder)   . Asthma     Uses inhaler prn   Past Surgical History  Procedure Laterality Date  . Appendectomy    . Foot surgery     Family History: family history includes Asthma in her other; COPD in her other; Cancer in her father, maternal grandfather, maternal grandmother, mother, and other; Diabetes in her maternal aunt and other; Heart attack in her maternal aunt and other; Heart disease in her maternal aunt and other; Hyperlipidemia in her other; Hypertension in her maternal aunt and other; Obesity in her other. There is no history of Other. Social History:  reports that she has been smoking Cigarettes.  She has a 10 pack-year smoking history. She has never used smokeless tobacco. She reports that she does not drink alcohol or use illicit drugs.   Prenatal Transfer Tool  Maternal Diabetes: Yes:  Diabetes Type:  Pre-pregnancy, Insulin/Medication controlled Genetic Screening: Normal Maternal Ultrasounds/Referrals: Normal Fetal Ultrasounds or other Referrals:  None Maternal Substance Abuse:  No Significant Maternal Medications:  Meds include: Other:  Significant Maternal Lab Results:  Lab values include: Group B Strep positive Other Comments:  None  Review of Systems  Respiratory: Negative.    Cardiovascular: Negative.     Dilation: 2 Effacement (%): 50 Station: -3 Exam by:: Dr. Jackelyn KnifeMeisinger Blood pressure 129/60, pulse 70, temperature 98.6 F (37 C), temperature source Oral, resp. rate 20, height 5\' 6"  (1.676 m), weight 98.431 kg (217 lb), last menstrual period 02/02/2014, SpO2 100 %. Maternal Exam:  Uterine Assessment: Contraction strength is mild.  Contraction frequency is irregular.   Abdomen: Patient reports no abdominal tenderness. Estimated fetal weight is 8 lbs.   Fetal presentation: vertex  Introitus: Normal vulva. Normal vagina.  Amniotic fluid character: not assessed.  Pelvis: adequate for delivery.   Cervix: Cervix evaluated by digital exam.     Fetal Exam Fetal Monitor Review: Mode: ultrasound.   Baseline rate: 120.  Variability: moderate (6-25 bpm).   Pattern: accelerations present and no decelerations.    Fetal State Assessment: Category I - tracings are normal.     Physical Exam  Vitals reviewed. Constitutional: She appears well-developed and well-nourished.  Cardiovascular: Normal rate, regular rhythm and normal heart sounds.   No murmur heard. Respiratory: Effort normal and breath sounds normal. No respiratory distress. She has no wheezes.  GI: Soft.    Prenatal labs: ABO, Rh: A/Positive/-- (01/05 0000) Antibody: Negative (01/05 0000) Rubella: Immune (01/05 0000) RPR: Nonreactive (01/07 0000)  HBsAg: Negative (01/05 0000)  HIV: Non-reactive (01/07 0000)  GBS: Positive (06/13 0000)   Assessment/Plan: IUP at 39+ weeks with Type II DM for induction.  On pitocin, will monitor progress.  Will  check CGB hourly for now to see if controlled.  On PCN for +GBS.   Jamie Palmer D 11/26/2014, 12:35 PM

## 2014-11-26 NOTE — Anesthesia Preprocedure Evaluation (Addendum)
Anesthesia Evaluation  Patient identified by MRN, date of birth, ID band Patient awake    Reviewed: Allergy & Precautions, H&P , NPO status , Patient's Chart, lab work & pertinent test results  Airway Mallampati: I  TM Distance: >3 FB Neck ROM: full    Dental no notable dental hx.    Pulmonary Current Smoker,    Pulmonary exam normal       Cardiovascular negative cardio ROS Normal cardiovascular exam    Neuro/Psych negative neurological ROS     GI/Hepatic negative GI ROS, Neg liver ROS,   Endo/Other  diabetes, Gestational  Renal/GU negative Renal ROS     Musculoskeletal   Abdominal Normal abdominal exam  (+)   Peds  Hematology negative hematology ROS (+)   Anesthesia Other Findings   Reproductive/Obstetrics (+) Pregnancy                             Anesthesia Physical Anesthesia Plan  ASA: II  Anesthesia Plan: Epidural   Post-op Pain Management:    Induction:   Airway Management Planned:   Additional Equipment:   Intra-op Plan:   Post-operative Plan:   Informed Consent: I have reviewed the patients History and Physical, chart, labs and discussed the procedure including the risks, benefits and alternatives for the proposed anesthesia with the patient or authorized representative who has indicated his/her understanding and acceptance.     Plan Discussed with:   Anesthesia Plan Comments:         Anesthesia Quick Evaluation

## 2014-11-26 NOTE — MAU Note (Signed)
Pt here for induction.

## 2014-11-26 NOTE — Anesthesia Procedure Notes (Signed)
Epidural Patient location during procedure: OB Start time: 11/26/2014 7:12 PM End time: 11/26/2014 7:16 PM  Staffing Anesthesiologist: Leilani AbleHATCHETT, Makhari Dovidio Performed by: anesthesiologist   Preanesthetic Checklist Completed: patient identified, surgical consent, pre-op evaluation, timeout performed, IV checked, risks and benefits discussed and monitors and equipment checked  Epidural Patient position: sitting Prep: site prepped and draped and DuraPrep Patient monitoring: continuous pulse ox and blood pressure Approach: midline Location: L3-L4 Injection technique: LOR air  Needle:  Needle type: Tuohy  Needle gauge: 17 G Needle length: 9 cm and 9 Needle insertion depth: 7 cm Catheter type: closed end flexible Catheter size: 19 Gauge Catheter at skin depth: 12 cm Test dose: negative and Other  Assessment Sensory level: T9 Events: blood not aspirated, injection not painful, no injection resistance, negative IV test and no paresthesia  Additional Notes Reason for block:procedure for pain

## 2014-11-26 NOTE — Progress Notes (Signed)
I was called at 2124 to come for delivery.  Delivery was at 2131 per delivery note.  Cord blood collected for donation.  Perineum reinspected, there is a small 1st degree laceration which is hemostatic and not repaired.

## 2014-11-26 NOTE — Progress Notes (Signed)
Feeling ctx Afeb, VSS CGB normal-60-110 FHT- Cat I VE-3/70/-2, vtx, AROM light mecomium Continue pitocin and PCN, monitor progress, anticipate SVD

## 2014-11-27 ENCOUNTER — Encounter (HOSPITAL_COMMUNITY): Payer: Self-pay | Admitting: *Deleted

## 2014-11-27 ENCOUNTER — Inpatient Hospital Stay (HOSPITAL_COMMUNITY): Payer: Medicaid Other | Admitting: Anesthesiology

## 2014-11-27 ENCOUNTER — Encounter (HOSPITAL_COMMUNITY)
Admission: AD | Disposition: A | Discharge status: Home / Self Care | Payer: Self-pay | Source: Ambulatory Visit | Attending: Obstetrics and Gynecology

## 2014-11-27 HISTORY — PX: TUBAL LIGATION: SHX77

## 2014-11-27 LAB — GLUCOSE, CAPILLARY
GLUCOSE-CAPILLARY: 73 mg/dL (ref 65–99)
GLUCOSE-CAPILLARY: 82 mg/dL (ref 65–99)
Glucose-Capillary: 90 mg/dL (ref 65–99)
Glucose-Capillary: 156 mg/dL — ABNORMAL HIGH (ref 65–99)
Glucose-Capillary: 175 mg/dL — ABNORMAL HIGH (ref 65–99)

## 2014-11-27 SURGERY — LIGATION, FALLOPIAN TUBE, POSTPARTUM
Anesthesia: Epidural | Site: Abdomen | Laterality: Bilateral

## 2014-11-27 MED ORDER — ONDANSETRON HCL 4 MG/2ML IJ SOLN: 4.0000 mg | Freq: Once | INTRAMUSCULAR | Status: AC | PRN

## 2014-11-27 MED ORDER — PNEUMOCOCCAL VAC POLYVALENT 25 MCG/0.5ML IJ INJ
0.5000 mL | INJECTION | INTRAMUSCULAR | Status: DC
  Filled 2014-11-27: qty 0.5

## 2014-11-27 MED ORDER — FAMOTIDINE 20 MG PO TABS
40.0000 mg | ORAL_TABLET | Freq: Once | ORAL | Status: AC
  Administered 2014-11-27: 40 mg via ORAL
  Filled 2014-11-27: qty 2

## 2014-11-27 MED ORDER — MIDAZOLAM HCL 2 MG/2ML IJ SOLN
INTRAMUSCULAR | Status: DC | PRN
  Administered 2014-11-27: 2 mg via INTRAVENOUS

## 2014-11-27 MED ORDER — PRENATAL MULTIVITAMIN CH
1.0000 | ORAL_TABLET | Freq: Every day | ORAL | Status: DC
  Administered 2014-11-27: 1 via ORAL
  Filled 2014-11-27: qty 1

## 2014-11-27 MED ORDER — FENTANYL CITRATE (PF) 100 MCG/2ML IJ SOLN
INTRAMUSCULAR | Status: AC
Start: 2014-11-27 — End: 2014-11-27
  Filled 2014-11-27: qty 2

## 2014-11-27 MED ORDER — SIMETHICONE 80 MG PO CHEW
80.0000 mg | CHEWABLE_TABLET | ORAL | Status: DC | PRN
  Administered 2014-11-27: 80 mg via ORAL
  Filled 2014-11-27: qty 1

## 2014-11-27 MED ORDER — DIPHENHYDRAMINE HCL 25 MG PO CAPS: 25.0000 mg | ORAL_CAPSULE | Freq: Four times a day (QID) | ORAL | Status: DC | PRN

## 2014-11-27 MED ORDER — FENTANYL CITRATE (PF) 100 MCG/2ML IJ SOLN
25.0000 ug | INTRAMUSCULAR | Status: DC | PRN
  Administered 2014-11-27 (×2): 25 ug via INTRAVENOUS

## 2014-11-27 MED ORDER — CYCLOBENZAPRINE HCL 10 MG PO TABS
10.0000 mg | ORAL_TABLET | Freq: Three times a day (TID) | ORAL | Status: DC | PRN
  Filled 2014-11-27: qty 1

## 2014-11-27 MED ORDER — SODIUM BICARBONATE 8.4 % IV SOLN
INTRAVENOUS | Status: AC
  Filled 2014-11-27: qty 50

## 2014-11-27 MED ORDER — LANOLIN HYDROUS EX OINT: TOPICAL_OINTMENT | CUTANEOUS | Status: DC | PRN

## 2014-11-27 MED ORDER — ALBUTEROL SULFATE (2.5 MG/3ML) 0.083% IN NEBU: 3.0000 mL | INHALATION_SOLUTION | Freq: Four times a day (QID) | RESPIRATORY_TRACT | Status: DC | PRN

## 2014-11-27 MED ORDER — MAGNESIUM HYDROXIDE 400 MG/5ML PO SUSP: 30.0000 mL | ORAL | Status: DC | PRN

## 2014-11-27 MED ORDER — ONDANSETRON HCL 4 MG/2ML IJ SOLN: 4.0000 mg | INTRAMUSCULAR | Status: DC | PRN

## 2014-11-27 MED ORDER — METHYLERGONOVINE MALEATE 0.2 MG/ML IJ SOLN: 0.2000 mg | INTRAMUSCULAR | Status: DC | PRN

## 2014-11-27 MED ORDER — METHYLERGONOVINE MALEATE 0.2 MG PO TABS: 0.2000 mg | ORAL_TABLET | ORAL | Status: DC | PRN

## 2014-11-27 MED ORDER — LACTATED RINGERS IV SOLN
INTRAVENOUS | Status: DC
  Administered 2014-11-27 (×2): via INTRAVENOUS

## 2014-11-27 MED ORDER — MIDAZOLAM HCL 2 MG/2ML IJ SOLN
INTRAMUSCULAR | Status: AC
  Filled 2014-11-27: qty 2

## 2014-11-27 MED ORDER — ACETAMINOPHEN 325 MG PO TABS: 650.0000 mg | ORAL_TABLET | ORAL | Status: DC | PRN

## 2014-11-27 MED ORDER — BUPIVACAINE HCL (PF) 0.25 % IJ SOLN
INTRAMUSCULAR | Status: DC | PRN
  Administered 2014-11-27: 10 mL

## 2014-11-27 MED ORDER — WITCH HAZEL-GLYCERIN EX PADS: 1.0000 "application " | MEDICATED_PAD | CUTANEOUS | Status: DC | PRN

## 2014-11-27 MED ORDER — DIBUCAINE 1 % RE OINT: 1.0000 "application " | TOPICAL_OINTMENT | RECTAL | Status: DC | PRN

## 2014-11-27 MED ORDER — SODIUM BICARBONATE 8.4 % IV SOLN
INTRAVENOUS | Status: DC | PRN
  Administered 2014-11-27: 5 mL via EPIDURAL
  Administered 2014-11-27: 3 mL via EPIDURAL
  Administered 2014-11-27 (×4): 5 mL via EPIDURAL

## 2014-11-27 MED ORDER — METOCLOPRAMIDE HCL 10 MG PO TABS
10.0000 mg | ORAL_TABLET | Freq: Once | ORAL | Status: AC
  Administered 2014-11-27: 10 mg via ORAL
  Filled 2014-11-27: qty 1

## 2014-11-27 MED ORDER — ONDANSETRON HCL 4 MG PO TABS: 4.0000 mg | ORAL_TABLET | ORAL | Status: DC | PRN

## 2014-11-27 MED ORDER — TETANUS-DIPHTH-ACELL PERTUSSIS 5-2.5-18.5 LF-MCG/0.5 IM SUSP: 0.5000 mL | Freq: Once | INTRAMUSCULAR | Status: DC

## 2014-11-27 MED ORDER — SENNOSIDES-DOCUSATE SODIUM 8.6-50 MG PO TABS
2.0000 | ORAL_TABLET | ORAL | Status: DC
  Administered 2014-11-28: 2 via ORAL
  Filled 2014-11-27: qty 2

## 2014-11-27 MED ORDER — ONDANSETRON HCL 4 MG/2ML IJ SOLN
INTRAMUSCULAR | Status: DC | PRN
  Administered 2014-11-27: 4 mg via INTRAVENOUS

## 2014-11-27 MED ORDER — FENTANYL CITRATE (PF) 100 MCG/2ML IJ SOLN
INTRAMUSCULAR | Status: DC | PRN
  Administered 2014-11-27 (×2): 50 ug via INTRAVENOUS

## 2014-11-27 MED ORDER — OXYCODONE-ACETAMINOPHEN 5-325 MG PO TABS
1.0000 | ORAL_TABLET | ORAL | Status: DC | PRN
  Administered 2014-11-27 – 2014-11-28 (×4): 1 via ORAL
  Filled 2014-11-27 (×4): qty 1

## 2014-11-27 MED ORDER — BENZOCAINE-MENTHOL 20-0.5 % EX AERO: 1.0000 "application " | INHALATION_SPRAY | CUTANEOUS | Status: DC | PRN

## 2014-11-27 MED ORDER — ZOLPIDEM TARTRATE 5 MG PO TABS: 5.0000 mg | ORAL_TABLET | Freq: Every evening | ORAL | Status: DC | PRN

## 2014-11-27 MED ORDER — PNEUMOCOCCAL VAC POLYVALENT 25 MCG/0.5ML IJ INJ: 0.5000 mL | INJECTION | INTRAMUSCULAR | Status: DC

## 2014-11-27 MED ORDER — ONDANSETRON HCL 4 MG/2ML IJ SOLN
INTRAMUSCULAR | Status: AC
  Filled 2014-11-27: qty 2

## 2014-11-27 MED ORDER — BUPIVACAINE HCL (PF) 0.25 % IJ SOLN
INTRAMUSCULAR | Status: AC
  Filled 2014-11-27: qty 30

## 2014-11-27 MED ORDER — MEASLES, MUMPS & RUBELLA VAC ~~LOC~~ INJ
0.5000 mL | INJECTION | Freq: Once | SUBCUTANEOUS | Status: DC
  Filled 2014-11-27: qty 0.5

## 2014-11-27 MED ORDER — LIDOCAINE-EPINEPHRINE (PF) 2 %-1:200000 IJ SOLN
INTRAMUSCULAR | Status: AC
  Filled 2014-11-27: qty 20

## 2014-11-27 SURGICAL SUPPLY — 23 items
BLADE SURG 11 STRL SS (BLADE) ×3 IMPLANT
CLOTH BEACON ORANGE TIMEOUT ST (SAFETY) ×3 IMPLANT
CONTAINER PREFILL 10% NBF 15ML (MISCELLANEOUS) ×6 IMPLANT
DECANTER SPIKE VIAL GLASS SM (MISCELLANEOUS) ×2 IMPLANT
DRSG OPSITE POSTOP 3X4 (GAUZE/BANDAGES/DRESSINGS) ×3 IMPLANT
DURAPREP 26ML APPLICATOR (WOUND CARE) ×2 IMPLANT
GLOVE BIOGEL PI IND STRL 7.0 (GLOVE) IMPLANT
GLOVE BIOGEL PI INDICATOR 7.0 (GLOVE) ×4
GLOVE ORTHO TXT STRL SZ7.5 (GLOVE) ×6 IMPLANT
GLOVE SURG SS PI 7.0 STRL IVOR (GLOVE) ×10 IMPLANT
GOWN STRL REUS W/TWL LRG LVL3 (GOWN DISPOSABLE) ×6 IMPLANT
NEEDLE HYPO 22GX1.5 SAFETY (NEEDLE) ×3 IMPLANT
NS IRRIG 1000ML POUR BTL (IV SOLUTION) ×3 IMPLANT
PACK ABDOMINAL MINOR (CUSTOM PROCEDURE TRAY) ×3 IMPLANT
SPONGE LAP 4X18 X RAY DECT (DISPOSABLE) ×2 IMPLANT
SUT PLAIN 0 NONE (SUTURE) ×3 IMPLANT
SUT VIC AB 0 CT1 27 (SUTURE) ×3
SUT VIC AB 0 CT1 27XBRD ANBCTR (SUTURE) ×1 IMPLANT
SUT VICRYL RAPIDE 4/0 PS 2 (SUTURE) ×3 IMPLANT
SYR CONTROL 10ML LL (SYRINGE) ×3 IMPLANT
TOWEL OR 17X24 6PK STRL BLUE (TOWEL DISPOSABLE) ×6 IMPLANT
TRAY FOLEY CATH SILVER 14FR (SET/KITS/TRAYS/PACK) ×3 IMPLANT
WATER STERILE IRR 1000ML POUR (IV SOLUTION) ×3 IMPLANT

## 2014-11-27 NOTE — Anesthesia Preprocedure Evaluation (Signed)
Anesthesia Evaluation  Patient identified by MRN, date of birth, ID band Patient awake    Reviewed: Allergy & Precautions, H&P , NPO status , Patient's Chart, lab work & pertinent test results  Airway Mallampati: I  TM Distance: >3 FB Neck ROM: full    Dental no notable dental hx.    Pulmonary Current Smoker,    Pulmonary exam normal       Cardiovascular negative cardio ROS Normal cardiovascular exam    Neuro/Psych negative neurological ROS     GI/Hepatic negative GI ROS, Neg liver ROS,   Endo/Other  diabetes, Gestational  Renal/GU negative Renal ROS     Musculoskeletal   Abdominal Normal abdominal exam  (+)   Peds  Hematology negative hematology ROS (+)   Anesthesia Other Findings   Reproductive/Obstetrics (+) Pregnancy                             Anesthesia Physical  Anesthesia Plan  ASA: II  Anesthesia Plan: Epidural   Post-op Pain Management:    Induction:   Airway Management Planned:   Additional Equipment:   Intra-op Plan:   Post-operative Plan:   Informed Consent: I have reviewed the patients History and Physical, chart, labs and discussed the procedure including the risks, benefits and alternatives for the proposed anesthesia with the patient or authorized representative who has indicated his/her understanding and acceptance.     Plan Discussed with:   Anesthesia Plan Comments: (Will assess NPO status, will plan to use epidural if able, if not will give spinal dose)        Anesthesia Quick Evaluation

## 2014-11-27 NOTE — Op Note (Signed)
Preoperative diagnosis: Postpartum, desires surgical sterility Postoperative diagnosis: Same Procedure: Postpartum bilateral partial salpingectomy Surgeon: Tarick Parenteau M.D. Anesthesia: Epidural Findings: She had normal postpartum anatomy Estimated blood loss: Minimal Specimens: Portions of bilateral fallopian tubes Complications: None  Procedure in detail:  The patient was taken to the operating room placed in the dorsosupine position. Her previously placed epidural was dosed appropriately. Abdomen was then prepped and draped in the usual sterile fashion. The level of her anesthesia was found to be adequate. Infraumbilical skin was infiltrated with quarter percent Marcaine and a 3 cm horizontal incision was made. The fascia was identified and elevated. Fascia was entered sharply and this also entered the peritoneal cavity. [employer] retractors were then used to expose both fallopian tubes. Each fallopian tube was identified and traced to its fimbriated end. The middle portion of each fallopian tube was elevated with a Babcock clamp. A knuckle of tube on each side was then ligated with #0 plain gut suture and the knuckle of tube was removed sharply. On both sides both ostia were identified and the stumps were hemostatic. Fascia was then closed in running fashion with 0 Vicryl. Skin was closed with running subcuticular suture of 4 4-0 Vicryl followed by a sterile dressing. Patient tolerated the procedure well and was taken to the recovery in stable condition. Counts were correct and she had PAS hose on throughout the procedure. 

## 2014-11-27 NOTE — Progress Notes (Signed)
Dr.Richardson notified of CBG=175 and IRR heart rhythmand low HR. Orders to call for CBG/ Insulin parameters if values are = to or > than 200. No new orders received regarding IRR heart rhythm. Doctor to assess in the am.

## 2014-11-27 NOTE — Lactation Note (Signed)
This note was copied from the chart of Jamie Erin Fullingrica Luckadoo. Lactation Consultation Note; Infant is 2817 hours old. She has had 4 feedings at the breast and was supplemented with formula using a bottle while Mother was in surgery. There are no recorded voids or stools.  Mother states that she breastfed with mostly pumping for one month with other 2 children. Mother states that she has a flat left nipple and is using a #24 nipple shield. She states she is seeing milk in the shield. Mother states that Rt nipple firms and is erect. She denies having a painful latch. Mother request a hand pump. Advised to review information in Baby and me book. Mother states she plans to breastfeed this infant longer. Mother will page with next feeding if desires assistance.   Patient Name: Jamie Palmer NGEXB'MToday's Date: 11/27/2014 Reason for consult: Initial assessment   Maternal Data    Feeding Feeding Type: Breast Fed Length of feed: 10 min (on the Rt breast without the nipple shield)  LATCH Score/Interventions                      Lactation Tools Discussed/Used     Consult Status Consult Status: Follow-up Date: 11/27/14 Follow-up type: In-patient    Jamie Palmer, Jamie Palmer [facility] 11/27/2014, 3:39 PM

## 2014-11-27 NOTE — Anesthesia Postprocedure Evaluation (Signed)
  Anesthesia Post-op Note  Patient: Jamie Palmer  Procedure(s) Performed: Procedure(s) (LRB): POST PARTUM TUBAL LIGATION (Bilateral)  Patient Location: PACU  Anesthesia Type: Epidural  Level of Consciousness: awake and alert   Airway and Oxygen Therapy: Patient Spontanous Breathing  Post-op Pain: mild  Post-op Assessment: Post-op Vital signs reviewed, Patient's Cardiovascular Status Stable, Respiratory Function Stable, Patent Airway and No signs of Nausea or vomiting  Last Vitals:  Filed Vitals:   11/27/14 0836  BP: 123/54  Pulse: 54  Temp: 36.7 C  Resp: 20    Post-op Vital Signs: stable   Complications: No apparent anesthesia complications

## 2014-11-27 NOTE — Progress Notes (Addendum)
Pulse at 1615= 46 per dinamapp. RN rechecked radial pulse at 1700= 56 with irr rhythm

## 2014-11-27 NOTE — Transfer of Care (Signed)
Immediate Anesthesia Transfer of Care Note  Patient: Jamie Palmer  Procedure(s) Performed: Procedure(s): POST PARTUM TUBAL LIGATION (Bilateral)  Patient Location: PACU  Anesthesia Type:Epidural  Level of Consciousness: awake  Airway & Oxygen Therapy: Patient Spontanous Breathing  Post-op Assessment: Report given to RN  Post vital signs: Reviewed and stable  Last Vitals:  Filed Vitals:   11/27/14 0836  BP: 123/54  Pulse: 54  Temp: 36.7 C  Resp: 20    Complications: No apparent anesthesia complications

## 2014-11-27 NOTE — Progress Notes (Signed)
PPD #1 Doing well, wants to go ahead with BTL Afeb, VSS Abd- soft, fundus firm To OR momentarily for PPBTL, continue routine care otherwise

## 2014-11-27 NOTE — Anesthesia Postprocedure Evaluation (Signed)
  Anesthesia Post-op Note  Patient: Jamie Palmer  Procedure(s) Performed: Procedure(s): POST PARTUM TUBAL LIGATION (Bilateral)  Patient Location: PACU and Mother/Baby  Anesthesia Type:Epidural  Level of Consciousness: awake, alert  and oriented  Airway and Oxygen Therapy: Patient Spontanous Breathing  Post-op Pain: mild  Post-op Assessment: Post-op Vital signs reviewed LLE Motor Response: Purposeful movement LLE Sensation: Tingling RLE Motor Response: Purposeful movement RLE Sensation: Tingling      Post-op Vital Signs: Reviewed and stable  Last Vitals:  Filed Vitals:   11/27/14 1624  BP: 117/57  Pulse: 46  Temp: 36.8 C  Resp: 20    Complications: No apparent anesthesia complications

## 2014-11-27 NOTE — Progress Notes (Signed)
CLINICAL SOCIAL WORK MATERNAL/CHILD NOTE  Patient Details  Name: Jamie Palmer MRN: [id number] Date of Birth: 2015-03-07  Date:  11/27/2014  Clinical Social Worker Initiating Note:  Loleta BooksSarah Gizzelle Lacomb, LCSW Date/ Time Initiated:  11/27/14/1300     Child's Name:  Jamie Palmer   Legal Guardian:  Mother - Jamie Palmer Nila  Need for Interpreter:  None   Date of Referral:  27-Sep-2014     Reason for Referral:  History of anxiety and depression  [facility]erral Source:  [facility]   Address:  [address] [place], [place] [address]  Phone number:  [phone]   Household Members:  Minor Children (Marissa age 617 and [place]illa age 61), Significant Other   Natural Supports (not living in the home):  Extended Family, Immediate Family   Professional Supports: None   Employment:   Homemaker  Type of Work:   N/A  Education:    N/A  Architectinancial Resources:  Medicaid   Other Resources:  Sales executiveood Stamps , WIC   Cultural/Religious Considerations Which May Impact Care:  None reported  Strengths:  Home prepared for child , Ability to meet basic needs , Pediatrician chosen    Risk Factors/Current Problems:   1)Mental Health Concerns: MOB presents with history of depression and anxiety since age 29.  She stated that she was prescribed Xanax and Zoloft pre-pregnancy, and plans to re-start medications when the infant is no longer breastfeeding.   Cognitive State:  Able to Concentrate , Alert , Goal Oriented , Linear Thinking , Insightful    Mood/Affect:  Happy , Animated, Comfortable , Calm    CSW Assessment:  CSW received request for consult due to maternal history of depression and anxiety.  MOB presented as easily engaged and receptive to the visit. She presented in a pleasant mood and displayed a full range in affect. She was observed to be providing skin to skin and bonding with the infant.  MOB presents with insight and self-awareness related to her mental health needs, and discussed  motivation to address mental health concerns postpartum.  No acute mental health symptoms observed or noted in MOB's thought process.   Per MOB, she has had symptoms of depression and anxiety since age 29.  She shared that she has previously participated in therapy and medication management.  MOB reported that prior to the pregnancy, she was prescribed Xanax and Zoloft. She stated that she discontinued Xanax with the +UPT, but continued Zoloft until mid-pregnancy when she discontinued due to potential risks for the infant.  MOB shared that she intends to re-start medications once she is no longer breastfeeding.  MOB denied acute mental health symptoms since she discontinued the medications, but openly discussed how she continues to learn to balance her mental health symptoms while parenting.  CSW continued to explore how mental health symptoms impacting parenting, and MOB stated that she continues to try to be aware of worsening symptoms, taking a break, and talking to her sister when she notes worsening of symptoms. MOB shared that she acts as a single parent frequently since the FOB travels a lot for work.  CSW continued to explore signs and symptoms that would indicate need to re-start medications. MOB stated that if she felt like she was "losing control", she would re-start medications. MOB recognized that although breastfeeding is beneficial and healthy for the infant, her mental health and overall feelings need to be considered.  MOB shared that she can return to her OB, PCP, or [facility] to re-start medications  postpartum.   MOB denied additional questions, concerns, or needs at this time. She stated that she feels "great", and denied current mental health needs.  She expressed appreciation for the visit, and agreed to contact CSW if needs arise.  CSW Plan/Description:   1)Patient/Family Education : Perinatal mood and anxiety disorders 2)No Further Intervention Required/No Barriers to Discharge     Modell Fendrick N, LCSW 11/27/2014, 2:59 PM  

## 2014-11-28 LAB — GLUCOSE, CAPILLARY: Glucose-Capillary: 79 mg/dL (ref 65–99)

## 2014-11-28 MED ORDER — IBUPROFEN 600 MG PO TABS: 600.0000 mg | ORAL_TABLET | Freq: Four times a day (QID) | ORAL | Status: DC

## 2014-11-28 MED ORDER — OXYCODONE-ACETAMINOPHEN 5-325 MG PO TABS: 1.0000 | ORAL_TABLET | ORAL | Status: DC | PRN

## 2014-11-28 NOTE — Progress Notes (Addendum)
Post Partum Day 2/POD pp BTL Subjective: tolerating PO and + flatus  Sore at incision but improving  Objective: Blood pressure 111/71, pulse 41, temperature 98.3 F (36.8 C), temperature source Oral, resp. rate 19, height 5\' 6"  (1.676 m), weight 98.431 kg (217 lb), last menstrual period 02/02/2014, SpO2 100 %, unknown if currently breastfeeding.  Physical Exam:  General: alert and cooperative Lochia: appropriate Uterine Fundus: firm Incision: C/D/I   Recent Labs  11/26/14 0805  HGB 12.9  HCT 38.5    Assessment/Plan: Discharge home  BS for the most part are WNL.  Two pp levels in 150-170 range but pt states she ate A LOT Pt was on metformin q day prior to pregnancy, but it was for a HgbA1C just  barely over 6, had to d/c in last week because BS levels were dropping to 60's.  Advised to stay off for now and monitor BS.  Will need a pp HgbA1C with her PMD to determine if needs to resume. With nursing, will burn more calories.   LOS: 2 days   Kevork Joyce W 11/28/2014, 9:42 AM

## 2014-11-28 NOTE — Lactation Note (Signed)
This note was copied from the chart of Jamie Erin Fullingrica Vanduyne. Lactation Consultation Note  Upon entering the room mother stated that this baby will be a pacifier baby. Mother has crack below right nipple and both nipples very sore.  She has comfort gels.  Mother has history of thrush.  Provided education. Discussed with OB/GYN that mother may need APNO.  Suggest mother call office if needed. Provided education about pacifiers and how they can mask feeding cues, changes baby's suck and make them more sleepy especially since this baby is <6lbs. Reminded mother to undress baby for feedings but keep hat on. Mother prepumped approx 3-654ml of colostrum with 2 hand pumps. Applied #24NS and prefilled w/ colostrum. Baby latched for approx 10 min and fell asleep.   Showed mother how to get more depth on nipple shield. Finger syringe fed the remaining colostrum to baby. Suggest mother breastfeed with #24NS, prefill NS.  Then give baby either additional pumped breastmilk or formula after feeding. Reminded her to post pump at least 4-6 times a day.  Faxed WIC referral for DEBP. Discussed engorgement care, monitoring voids/stools and how smoking can reduce mother's milk supply. Offered outpatient appt.  Mother states she will call if needed.   Patient Name: Jamie Palmer RUEAV'WToday's Date: 11/28/2014 Reason for consult: Follow-up assessment   Maternal Data    Feeding Feeding Type: Breast Fed Length of feed: 10 min  LATCH Score/Interventions Latch: Repeated attempts needed to sustain latch, nipple held in mouth throughout feeding, stimulation needed to elicit sucking reflex.  Audible Swallowing: A few with stimulation Intervention(s): Skin to skin  Type of Nipple: Everted at rest and after stimulation  Comfort (Breast/Nipple): Filling, red/small blisters or bruises, mild/mod discomfort  Problem noted: Mild/Moderate discomfort Interventions (Mild/moderate discomfort): Post-pump;Comfort  gels  Hold (Positioning): Assistance needed to correctly position infant at breast and maintain latch.  LATCH Score: 6  Lactation Tools Discussed/Used Tools: Nipple Shields Nipple shield size: 24   Consult Status Consult Status: Follow-up Date: 11/29/14 Follow-up type: In-patient    [name], [name] 11/28/2014, 10:03 AM

## 2014-11-28 NOTE — Discharge Summary (Signed)
Obstetric Discharge Summary Reason for Admission: induction of labor Prenatal Procedures: none Intrapartum Procedures: spontaneous vaginal delivery (precipitous)  Postpartum Procedures: P.P. tubal ligation Complications-Operative and Postpartum: none HEMOGLOBIN  Date Value Ref Range Status  11/26/2014 12.9 12.0 - 15.0 g/dL Final   HCT  Date Value Ref Range Status  11/26/2014 38.5 36.0 - 46.0 % Final    Physical Exam:  General: alert and cooperative Lochia: appropriate Uterine Fundus: firm Incision: C/D/I   Discharge Diagnoses: Term Pregnancy-delivered                                          S/p tubal ligation                                         Type 2 Diabetes  Discharge Information: Date: 11/28/2014 Activity: pelvic rest Diet: routine Medications: Ibuprofen and Percocet Condition: improved Instructions: refer to practice specific booklet Discharge to: home   Pt will hold on restarting her metformin for now, will monitor BS at home and call if >200.  She will schedule an appt with her PMD to f/u her diabetes in the next 8 weeks Follow-up Information    Follow up with MEISINGER,TODD D, MD. Schedule an appointment as soon as possible for a visit in 2 weeks.   Specialty:  Obstetrics and Gynecology   Why:  incision check   Contact information:   [address], [address] [place] [address] [phone]       Newborn Data: Live born female  Birth Weight: 6 lb 1.4 oz (2760 g) APGAR: 8, 9  Home with mother.  Jamie Palmer,Jamie Palmer 11/28/2014, 9:54 AM

## 2014-11-30 ENCOUNTER — Encounter (HOSPITAL_COMMUNITY): Payer: Self-pay | Admitting: Obstetrics and Gynecology

## 2014-11-30 NOTE — Anesthesia Postprocedure Evaluation (Signed)
Anesthesia Post Note  Patient: Jamie Palmer  Procedure(s) Performed: * No procedures listed *  Anesthesia type: Epidural  Patient location: Mother/Baby  Post pain: Pain level controlled  Post assessment: Post-op Vital signs reviewed  Last Vitals: There were no vitals filed for this visit.  Post vital signs: Reviewed  Level of consciousness: awake  Complications: No apparent anesthesia complications

## 2015-01-15 ENCOUNTER — Emergency Department (HOSPITAL_COMMUNITY)
Admission: EM | Admit: 2015-01-15 | Discharge: 2015-01-15 | Disposition: A | Discharge status: Home / Self Care | Payer: Medicaid Other | Attending: Emergency Medicine | Admitting: Emergency Medicine

## 2015-01-15 ENCOUNTER — Encounter (HOSPITAL_COMMUNITY): Payer: Self-pay | Admitting: Family Medicine

## 2015-01-15 DIAGNOSIS — R5383 Other fatigue: Secondary | ICD-10-CM | POA: Diagnosis present

## 2015-01-15 DIAGNOSIS — J45909 Unspecified asthma, uncomplicated: Secondary | ICD-10-CM | POA: Insufficient documentation

## 2015-01-15 DIAGNOSIS — Z79899 Other long term (current) drug therapy: Secondary | ICD-10-CM | POA: Diagnosis not present

## 2015-01-15 DIAGNOSIS — Z720 Tobacco use: Secondary | ICD-10-CM | POA: Insufficient documentation

## 2015-01-15 DIAGNOSIS — R42 Dizziness and giddiness: Secondary | ICD-10-CM | POA: Insufficient documentation

## 2015-01-15 DIAGNOSIS — Z8632 Personal history of gestational diabetes: Secondary | ICD-10-CM | POA: Insufficient documentation

## 2015-01-15 DIAGNOSIS — Z8659 Personal history of other mental and behavioral disorders: Secondary | ICD-10-CM | POA: Diagnosis not present

## 2015-01-15 DIAGNOSIS — Z8619 Personal history of other infectious and parasitic diseases: Secondary | ICD-10-CM | POA: Insufficient documentation

## 2015-01-15 LAB — URINE MICROSCOPIC-ADD ON

## 2015-01-15 LAB — I-STAT BETA HCG BLOOD, ED (MC, WL, AP ONLY): I-stat hCG, quantitative: <5 m[IU]/mL (ref <5)

## 2015-01-15 LAB — CBC
HCT: 42.2 % (ref 36.0–46.0)
Hemoglobin: 14.4 g/dL (ref 12.0–15.0)
MCH: 31.9 pg (ref 26.0–34.0)
MCHC: 34.1 g/dL (ref 30.0–36.0)
MCV: 93.6 fL (ref 78.0–100.0)
PLATELETS: 301 10*3/uL (ref 150–400)
RBC: 4.51 MIL/uL (ref 3.87–5.11)
RDW: 13.2 % (ref 11.5–15.5)
WBC: 10.9 10*3/uL — ABNORMAL HIGH (ref 4.0–10.5)

## 2015-01-15 LAB — BASIC METABOLIC PANEL
Anion gap: 9 (ref 5–15)
BUN: 9 mg/dL (ref 6–20)
CALCIUM: 8.7 mg/dL — AB (ref 8.9–10.3)
CO2: 24 mmol/L (ref 22–32)
CREATININE: 0.85 mg/dL (ref 0.44–1.00)
Chloride: 103 mmol/L (ref 101–111)
GFR calc Af Amer: >60 mL/min (ref >60)
GFR calc non Af Amer: >60 mL/min (ref >60)
GLUCOSE: 149 mg/dL — AB (ref 65–99)
Potassium: 4.1 mmol/L (ref 3.5–5.1)
Sodium: 136 mmol/L (ref 135–145)

## 2015-01-15 LAB — URINALYSIS, ROUTINE W REFLEX MICROSCOPIC
Bilirubin Urine: NEGATIVE
GLUCOSE, UA: NEGATIVE mg/dL
KETONES UR: NEGATIVE mg/dL
Leukocytes, UA: NEGATIVE
Nitrite: NEGATIVE
PROTEIN: NEGATIVE mg/dL
Specific Gravity, Urine: 1.012 (ref 1.005–1.030)
Urobilinogen, UA: 1 mg/dL (ref 0.0–1.0)
pH: 6 (ref 5.0–8.0)

## 2015-01-15 NOTE — ED Provider Notes (Signed)
CSN: [id number]     Arrival date & time 01/15/15  1213 History   First MD Initiated Contact with Patient 01/15/15 1339     Chief Complaint  Patient presents with  . Fatigue    HPI  Ms. Schomburg is a 29 year old female presenting with fatigue and lightheadedness. Reports that she has been feeling more fatigued and weak over the past few days. When she was driving her car this morning, she got extremely lightheaded and "woozy" and had to pull over. States that when she gets up to walk, she feels like she might pass out. No LOC or falls. Pt used a friend's heart rate monitor and reports that her pulse was in the 40s at home. Pt recently gave birth in July. No complications during delivery. Pt reports that her first period since delivery started last night. She states that it is much heavier than usual and she has already gone through 1 pad and 2 tampons today. Endorses some blurred vision when she gets lightheaded. Denies fevers, headaches, chest pain, SOB, nausea, vomiting or diarrhea. Denies cardiac history. Was diagnosed with gestational diabetes; her self-reported BG this morning was 104. 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   Past Medical History  Diagnosis Date  . Depression   . Gestational diabetes     glyburide  . Anxiety   . Shingles   . ADHD (attention deficit hyperactivity disorder)   . Asthma     Uses inhaler prn   Past Surgical History  Procedure Laterality Date  . Appendectomy    . Foot surgery    . Tubal ligation Bilateral 11/27/2014    Procedure: POST PARTUM TUBAL LIGATION;  Surgeon: Lavina Hamman, MD;  Location: [facility];  Service: Gynecology;  Laterality: Bilateral;   Family History  Problem Relation Age of Onset  . Diabetes Other   . Hypertension Other   . Heart disease Other   . Cancer Other   . COPD Other   . Asthma Other   . Heart attack Other   . Obesity Other   . Hyperlipidemia Other   . Other Neg Hx   . Cancer Mother     lung  . Cancer Maternal  Grandmother     lung  . Cancer Father   . Heart attack Maternal Aunt   . Hypertension Maternal Aunt   . Diabetes Maternal Aunt   . Heart disease Maternal Aunt   . Cancer Maternal Grandfather     lung   Social History  Substance Use Topics  . Smoking status: Current Every Day Smoker -- 1.00 packs/day for 10 years    Types: Cigarettes  . Smokeless tobacco: Never Used  . Alcohol Use: No   OB History    Gravida Para Term Preterm AB TAB SAB Ectopic Multiple Living   0 0 0 0 0 0 3     Review of Systems  Constitutional: Negative for fever and chills.  Eyes: Positive for visual disturbance.  Respiratory: Negative for shortness of breath and wheezing.   Cardiovascular: Negative for chest pain and palpitations.  Gastrointestinal: Negative for nausea, vomiting, abdominal pain and diarrhea.  Neurological: Positive for weakness and light-headedness. Negative for syncope and headaches.      Allergies  Review of patient's allergies indicates no known allergies.  Home Medications   Prior to Admission medications   Medication Sig Start Date End Date Taking? Authorizing Provider  albuterol (PROVENTIL HFA;VENTOLIN HFA) 108 (90 BASE) MCG/ACT inhaler  1-2 puffs every 6 hours while awake for the next 36 hours then prn cough/wheeze/sob 08/25/14   Marny Lowenstein, PA-C  ibuprofen (ADVIL,MOTRIN) 600 MG tablet Take 1 tablet (600 mg total) by mouth every 6 (six) hours. 11/28/14   Huel Cote, MD  oxyCODONE-acetaminophen (PERCOCET/ROXICET) 5-325 MG per tablet Take 1-2 tablets by mouth every 4 (four) hours as needed for severe pain. 11/28/14   Huel Cote, MD  pantoprazole (PROTONIX) 40 MG tablet Take 40 mg by mouth daily. 10/22/14   Historical Provider, MD  Prenatal Vit-Fe Fumarate-FA (PRENATAL MULTIVITAMIN) TABS tablet Take 1 tablet by mouth at bedtime.    Historical Provider, MD  Spacer/Aero-Holding Chambers DEVI Use as directed 02/04/14   Ozella Rocks, MD   BP 112/77 mmHg  Pulse 84   Temp(Src) 98.1 F (36.7 C)  Resp 16  SpO2 97%  LMP 01/14/2015 Physical Exam  Constitutional: She is oriented to person, place, and time. She appears well-developed and well-nourished. No distress.  HENT:  Head: Normocephalic and atraumatic.  Eyes: Conjunctivae and EOM are normal. Pupils are equal, round, and reactive to light.  Neck: Normal range of motion.  Cardiovascular: Normal rate, regular rhythm, normal heart sounds and intact distal pulses.   No murmur heard. Pulmonary/Chest: Effort normal and breath sounds normal. No respiratory distress. She has no wheezes.  Abdominal: Soft. There is no tenderness.  Musculoskeletal: Normal range of motion.  Neurological: She is alert and oriented to person, place, and time.  Skin: Skin is warm and dry.  Psychiatric: She has a normal mood and affect. Her behavior is normal.  Nursing note and vitals reviewed.   ED Course  Procedures (including critical care time) Labs Review Labs Reviewed  BASIC METABOLIC PANEL - Abnormal; Notable for the following:    Glucose, Bld 149 (*)    Calcium 8.7 (*)    All other components within normal limits  CBC - Abnormal; Notable for the following:    WBC 10.9 (*)    All other components within normal limits  URINALYSIS, ROUTINE W REFLEX MICROSCOPIC (NOT AT [facility]) - Abnormal; Notable for the following:    Hgb urine dipstick MODERATE (*)    All other components within normal limits  URINE MICROSCOPIC-ADD ON  CBG MONITORING, ED  I-STAT BETA HCG BLOOD, ED ([facility], [facility], [facility] ONLY)    Imaging Review No results found. I have personally reviewed and evaluated these images and lab results as part of my medical decision-making.   EKG Interpretation   Date/Time:  Friday January 15 2015 12:45:32 EDT Ventricular Rate:  61 PR Interval:  162 QRS Duration: 84 QT Interval:  420 QTC Calculation: 422 R Axis:   85 Text Interpretation:  Normal sinus rhythm with sinus arrhythmia Right  atrial enlargement Confirmed  by Denton Lank  MD, Caryn Bee ([id number]) on 01/15/2015  2:12:25 PM      MDM   Final diagnoses:  Lightheadedness   Pt presenting with lightheadedness and increased fatigue. Denies syncope, chest pain, palpitations or shortness of breath. Also noting heavy period that started last night. Pt recently gave birth and this is first period since delivery. Vital signs show pulses ranging from 42-84 while pt here. During bradycardia, pt denies symptoms of lightheadedness, chest pain, palpitations or shortness of breath. Resting comfortably, unlabored breathing. Blood pressure WNL. Pt not symptomatic at time of interview, only complaining of fatigue. Neurological exam nonfocal. EKG showing normal sinus rhythm. Pt will call and schedule outpatient follow up with [facility]. Pt will not drive  until symptoms resolution. Instructed to return to ED with syncope, chest pain, heart palpitations, shortness of breath or further worsening of symptoms. Pt agrees with this plan.      Rolm Gala Taheem Fricke, PA-C 01/15/15 2235  Cathren Laine, MD 01/18/15 1012

## 2015-01-15 NOTE — Discharge Instructions (Signed)
-   Call [facility] to schedule a follow up appointment - Do not drive until lightheadedness resolves - Keep fluid intake up - Return to ED with chest pain, heart palpitations, passing out, shortness of breath, or further worsening of symptoms

## 2015-01-15 NOTE — ED Notes (Signed)
Pt here for weakness. sts that she was driving and felt funny so she checked her blood sugar and it was 104. sts she checked her BP and pulse and sts pulse was low. Here pule 78. sts also that she has been bleeding a lot. sts recent pregnancy.

## 2015-01-24 ENCOUNTER — Emergency Department (HOSPITAL_COMMUNITY)
Admission: EM | Admit: 2015-01-24 | Discharge: 2015-01-24 | Disposition: A | Discharge status: Home / Self Care | Payer: Medicaid Other | Attending: Emergency Medicine | Admitting: Emergency Medicine

## 2015-01-24 ENCOUNTER — Emergency Department (HOSPITAL_COMMUNITY): Payer: Medicaid Other

## 2015-01-24 ENCOUNTER — Encounter (HOSPITAL_COMMUNITY): Payer: Self-pay | Admitting: *Deleted

## 2015-01-24 DIAGNOSIS — N390 Urinary tract infection, site not specified: Secondary | ICD-10-CM | POA: Diagnosis not present

## 2015-01-24 DIAGNOSIS — Z79899 Other long term (current) drug therapy: Secondary | ICD-10-CM | POA: Insufficient documentation

## 2015-01-24 DIAGNOSIS — Z3202 Encounter for pregnancy test, result negative: Secondary | ICD-10-CM | POA: Insufficient documentation

## 2015-01-24 DIAGNOSIS — Y998 Other external cause status: Secondary | ICD-10-CM | POA: Insufficient documentation

## 2015-01-24 DIAGNOSIS — Z8659 Personal history of other mental and behavioral disorders: Secondary | ICD-10-CM | POA: Diagnosis not present

## 2015-01-24 DIAGNOSIS — J45909 Unspecified asthma, uncomplicated: Secondary | ICD-10-CM | POA: Diagnosis not present

## 2015-01-24 DIAGNOSIS — Z720 Tobacco use: Secondary | ICD-10-CM | POA: Diagnosis not present

## 2015-01-24 DIAGNOSIS — Y9289 Other specified places as the place of occurrence of the external cause: Secondary | ICD-10-CM | POA: Diagnosis not present

## 2015-01-24 DIAGNOSIS — S3992XA Unspecified injury of lower back, initial encounter: Secondary | ICD-10-CM | POA: Diagnosis present

## 2015-01-24 DIAGNOSIS — Z8632 Personal history of gestational diabetes: Secondary | ICD-10-CM | POA: Insufficient documentation

## 2015-01-24 DIAGNOSIS — S39012A Strain of muscle, fascia and tendon of lower back, initial encounter: Secondary | ICD-10-CM | POA: Insufficient documentation

## 2015-01-24 DIAGNOSIS — Z8619 Personal history of other infectious and parasitic diseases: Secondary | ICD-10-CM | POA: Diagnosis not present

## 2015-01-24 DIAGNOSIS — X58XXXA Exposure to other specified factors, initial encounter: Secondary | ICD-10-CM | POA: Insufficient documentation

## 2015-01-24 DIAGNOSIS — Y9389 Activity, other specified: Secondary | ICD-10-CM | POA: Diagnosis not present

## 2015-01-24 LAB — URINE MICROSCOPIC-ADD ON

## 2015-01-24 LAB — URINALYSIS, ROUTINE W REFLEX MICROSCOPIC
GLUCOSE, UA: NEGATIVE mg/dL
HGB URINE DIPSTICK: NEGATIVE
Ketones, ur: NEGATIVE mg/dL
Nitrite: NEGATIVE
PROTEIN: NEGATIVE mg/dL
SPECIFIC GRAVITY, URINE: 1.037 — AB (ref 1.005–1.030)
UROBILINOGEN UA: 1 mg/dL (ref 0.0–1.0)
pH: 5.5 (ref 5.0–8.0)

## 2015-01-24 LAB — POC URINE PREG, ED: Preg Test, Ur: NEGATIVE

## 2015-01-24 MED ORDER — NAPROXEN 500 MG PO TABS: 500.0000 mg | ORAL_TABLET | Freq: Two times a day (BID) | ORAL | Status: DC

## 2015-01-24 MED ORDER — METHOCARBAMOL 500 MG PO TABS: 500.0000 mg | ORAL_TABLET | Freq: Two times a day (BID) | ORAL | Status: DC

## 2015-01-24 MED ORDER — CEPHALEXIN 500 MG PO CAPS: 500.0000 mg | ORAL_CAPSULE | Freq: Two times a day (BID) | ORAL | Status: DC

## 2015-01-24 MED ORDER — TRAMADOL HCL 50 MG PO TABS: 50.0000 mg | ORAL_TABLET | Freq: Four times a day (QID) | ORAL | Status: DC | PRN

## 2015-01-24 NOTE — ED Provider Notes (Signed)
CSN: [id number]     Arrival date & time 01/24/15  1613 History  This chart was scribed for non-physician practitioner Jaynie Crumble, PA-C working with Lorre Nick, MD by Lyndel Safe, ED Scribe. This patient was seen in room WTR9/WTR9 and the patient's care was started at 5:52 PM.  Chief Complaint  Patient presents with  . Back Pain   The history is provided by the patient. No language interpreter was used.   HPI Comments: Jamie Palmer is a 29 y.o. female who presents to the Emergency Department complaining of sudden onset, constant, moderate lower back pain onset 1 day ago. The pt reports she was at the [employer] indoor playground with inflatable bounce houses yesterday when she hyperextended her back. She is ambulating without difficulty. Pt has been taking flexeril from a previous prescription with no relief. The pt notes a history of back pain in the past that she was evaluated by her PCP for but was unable to get an MRI due to insurance issues. She denies weakness or numbness in BLE, bowel or bladder incontinence, or any urinary symptoms.   Past Medical History  Diagnosis Date  . Depression   . Gestational diabetes     glyburide  . Anxiety   . Shingles   . ADHD (attention deficit hyperactivity disorder)   . Asthma     Uses inhaler prn   Past Surgical History  Procedure Laterality Date  . Appendectomy    . Foot surgery    . Tubal ligation Bilateral 11/27/2014    Procedure: POST PARTUM TUBAL LIGATION;  Surgeon: Lavina Hamman, MD;  Location: [facility];  Service: Gynecology;  Laterality: Bilateral;   Family History  Problem Relation Age of Onset  . Diabetes Other   . Hypertension Other   . Heart disease Other   . Cancer Other   . COPD Other   . Asthma Other   . Heart attack Other   . Obesity Other   . Hyperlipidemia Other   . Other Neg Hx   . Cancer Mother     lung  . Cancer Maternal Grandmother     lung  . Cancer Father   . Heart attack Maternal Aunt    . Hypertension Maternal Aunt   . Diabetes Maternal Aunt   . Heart disease Maternal Aunt   . Cancer Maternal Grandfather     lung   Social History  Substance Use Topics  . Smoking status: Current Every Day Smoker -- 1.00 packs/day for 10 years    Types: Cigarettes  . Smokeless tobacco: Never Used  . Alcohol Use: No   OB History    Gravida Para Term Preterm AB TAB SAB Ectopic Multiple Living   0 0 0 0 0 0 3     Review of Systems  Genitourinary: Negative for dysuria, urgency and frequency.  Musculoskeletal: Positive for back pain ( lower).  Neurological: Negative for weakness and numbness.  All other systems reviewed and are negative.  Allergies  Review of patient's allergies indicates no known allergies.  Home Medications   Prior to Admission medications   Medication Sig Start Date End Date Taking? Authorizing Provider  albuterol (PROVENTIL HFA;VENTOLIN HFA) 108 (90 BASE) MCG/ACT inhaler 1-2 puffs every 6 hours while awake for the next 36 hours then prn cough/wheeze/sob 08/25/14   Marny Lowenstein, PA-C  ibuprofen (ADVIL,MOTRIN) 600 MG tablet Take 1 tablet (600 mg total) by mouth every 6 (six) hours. 11/28/14   Olegario Messier  Senaida Ores, MD  oxyCODONE-acetaminophen (PERCOCET/ROXICET) 5-325 MG per tablet Take 1-2 tablets by mouth every 4 (four) hours as needed for severe pain. 11/28/14   Huel Cote, MD  pantoprazole (PROTONIX) 40 MG tablet Take 40 mg by mouth daily. 10/22/14   Historical Provider, MD  Prenatal Vit-Fe Fumarate-FA (PRENATAL MULTIVITAMIN) TABS tablet Take 1 tablet by mouth at bedtime.    Historical Provider, MD  Spacer/Aero-Holding Chambers DEVI Use as directed 02/04/14   Ozella Rocks, MD   BP 129/74 mmHg  Pulse 81  Temp(Src) 98.3 F (36.8 C) (Oral)  Resp 18  SpO2 99%  LMP 01/14/2015 Physical Exam  Constitutional: She is oriented to person, place, and time. She appears well-developed and well-nourished. No distress.  HENT:  Head: Normocephalic.  Eyes:  Conjunctivae are normal.  Neck: Neck supple. No JVD present.  Pulmonary/Chest: Effort normal. No respiratory distress.  Musculoskeletal:  Midline lumbar spine tenderness. Left paravertebral tenderness, left SI joint tenderness. Pain with bilateral straight leg raise, eft worse than right. Normal knee and ankle. Dorsal pedal pulses intact and equal bilaterally.  Neurological: She is alert and oriented to person, place, and time. Coordination normal.  5/5 and equal lower extremity strength. 2+ and equal patellar reflexes bilaterally. Pt able to dorsiflex bilateral toes and feet with good strength against resistance. Equal sensation bilaterally over thighs and lower legs.   Skin: Skin is warm. No rash noted. No erythema. No pallor.  Psychiatric: She has a normal mood and affect. Her behavior is normal.  Nursing note and vitals reviewed.   ED Course  Procedures  DIAGNOSTIC STUDIES: Oxygen Saturation is 99% on RA, normal by my interpretation.    COORDINATION OF CARE: 5:57 PM Discussed treatment plan with pt. Xray of Lumbar spine and urinalysis ordered. Pt acknowledges and agrees to plan.   Labs Review Labs Reviewed  URINALYSIS, ROUTINE W REFLEX MICROSCOPIC (NOT AT [facility])  POC URINE PREG, ED    Imaging Review No results found. I have personally reviewed and evaluated these images and lab results as part of my medical decision-making.   EKG Interpretation None      MDM   Final diagnoses:  Lumbar strain, initial encounter  UTI (lower urinary tract infection)   Patient with lower back pain after hyperextending it while playing on a bouncy house playground. She reports prior history of back pain. She is neurovascularly intact. No evidence of cauda equina. X-ray was negative. Most likely a lumbar strain, however exacerbation of her reported degenerative disc disease could be a possibility. At this time no indication for any emergent imaging other than the x-ray. Will treat with NSAIDs,  muscle relaxants,Robaxin, tramadol, Keflex for UTI, urinalysis was obtained by triage nurse.Instructed to follow with primary care doctor.  Filed Vitals:   01/24/15 1657 01/24/15 1907  BP: 129/74 126/94  Pulse: 81 86  Temp: 98.3 F (36.8 C)   TempSrc: Oral   Resp: 18 18  SpO2: 99% 100%   I personally performed the services described in this documentation, which was scribed in my presence. The recorded information has been reviewed and is accurate.   Jaynie Crumble, PA-C 01/24/15 1950  Lorre Nick, MD 01/24/15 [phone]

## 2015-01-24 NOTE — ED Notes (Signed)
Pt reports she was at the bumper jumpers yesterday, hyperextending her back.  Pt reports low back pain.  Ambulatory without difficulty at this time.

## 2015-01-24 NOTE — Discharge Instructions (Signed)
Take Keflex as prescribed until all gone for UTI. Naproxen for pain and inflammation. Tramadol for severe pain. Stop Flexeril and try taking Robaxin for muscle spasms. Try heating pads, stretches, see exercises below. Follow-up with primary care doctor.  Lumbosacral Strain Lumbosacral strain is a strain of any of the parts that make up your lumbosacral vertebrae. Your lumbosacral vertebrae are the bones that make up the lower third of your backbone. Your lumbosacral vertebrae are held together by muscles and tough, fibrous tissue (ligaments).  CAUSES  A sudden blow to your back can cause lumbosacral strain. Also, anything that causes an excessive stretch of the muscles in the low back can cause this strain. This is typically seen when people exert themselves strenuously, fall, lift heavy objects, bend, or crouch repeatedly. RISK FACTORS  Physically demanding work.  Participation in pushing or pulling sports or sports that require a sudden twist of the back (tennis, golf, baseball).  Weight lifting.  Excessive lower back curvature.  Forward-tilted pelvis.  Weak back or abdominal muscles or both.  Tight hamstrings. SIGNS AND SYMPTOMS  Lumbosacral strain may cause pain in the area of your injury or pain that moves (radiates) down your leg.  DIAGNOSIS Your health care provider can often diagnose lumbosacral strain through a physical exam. In some cases, you may need tests such as X-ray exams.  TREATMENT  Treatment for your lower back injury depends on many factors that your clinician will have to evaluate. However, most treatment will include the use of anti-inflammatory medicines. HOME CARE INSTRUCTIONS   Avoid hard physical activities (tennis, racquetball, waterskiing) if you are not in proper physical condition for it. This may aggravate or create problems.  If you have a back problem, avoid sports requiring sudden body movements. Swimming and walking are generally safer  activities.  Maintain good posture.  Maintain a healthy weight.  For acute conditions, you may put ice on the injured area.  Put ice in a plastic bag.  Place a towel between your skin and the bag.  Leave the ice on for 20 minutes, 2-3 times a day.  When the low back starts healing, stretching and strengthening exercises may be recommended. SEEK MEDICAL CARE IF:  Your back pain is getting worse.  You experience severe back pain not relieved with medicines. SEEK IMMEDIATE MEDICAL CARE IF:   You have numbness, tingling, weakness, or problems with the use of your arms or legs.  There is a change in bowel or bladder control.  You have increasing pain in any area of the body, including your belly (abdomen).  You notice shortness of breath, dizziness, or feel faint.  You feel sick to your stomach (nauseous), are throwing up (vomiting), or become sweaty.  You notice discoloration of your toes or legs, or your feet get very cold. MAKE SURE YOU:   Understand these instructions.  Will watch your condition.  Will get help right away if you are not doing well or get worse. Document Released: 02/08/2005 Document Revised: 05/06/2013 Document Reviewed: 12/18/2012 [facility] Patient Information 2015 [place], [place]. This information is not intended to replace advice given to you by your health care provider. Make sure you discuss any questions you have with your health care provider.    Back Exercises These exercises may help you when beginning to rehabilitate your injury. Your symptoms may resolve with or without further involvement from your physician, physical therapist or athletic trainer. While completing these exercises, remember:   Restoring tissue flexibility helps normal motion  to return to the joints. This allows healthier, less painful movement and activity.  An effective stretch should be held for at least 30 seconds.  A stretch should never be painful. You should only  feel a gentle lengthening or release in the stretched tissue. STRETCH - Extension, Prone on Elbows   Lie on your stomach on the floor, a bed will be too soft. Place your palms about shoulder width apart and at the height of your head.  Place your elbows under your shoulders. If this is too painful, stack pillows under your chest.  Allow your body to relax so that your hips drop lower and make contact more completely with the floor.  Hold this position for __________ seconds.  Slowly return to lying flat on the floor. Repeat __________ times. Complete this exercise __________ times per day.  RANGE OF MOTION - Extension, Prone Press Ups   Lie on your stomach on the floor, a bed will be too soft. Place your palms about shoulder width apart and at the height of your head.  Keeping your back as relaxed as possible, slowly straighten your elbows while keeping your hips on the floor. You may adjust the placement of your hands to maximize your comfort. As you gain motion, your hands will come more underneath your shoulders.  Hold this position __________ seconds.  Slowly return to lying flat on the floor. Repeat __________ times. Complete this exercise __________ times per day.  RANGE OF MOTION- Quadruped, Neutral Spine   Assume a hands and knees position on a firm surface. Keep your hands under your shoulders and your knees under your hips. You may place padding under your knees for comfort.  Drop your head and point your tail bone toward the ground below you. This will round out your low back like an angry cat. Hold this position for __________ seconds.  Slowly lift your head and release your tail bone so that your back sags into a large arch, like an old horse.  Hold this position for __________ seconds.  Repeat this until you feel limber in your low back.  Now, find your "sweet spot." This will be the most comfortable position somewhere between the two previous positions. This is your  neutral spine. Once you have found this position, tense your stomach muscles to support your low back.  Hold this position for __________ seconds. Repeat __________ times. Complete this exercise __________ times per day.  STRETCH - Flexion, Single Knee to Chest   Lie on a firm bed or floor with both legs extended in front of you.  Keeping one leg in contact with the floor, bring your opposite knee to your chest. Hold your leg in place by either grabbing behind your thigh or at your knee.  Pull until you feel a gentle stretch in your low back. Hold __________ seconds.  Slowly release your grasp and repeat the exercise with the opposite side. Repeat __________ times. Complete this exercise __________ times per day.  STRETCH - Hamstrings, Standing  Stand or sit and extend your right / left leg, placing your foot on a chair or foot stool  Keeping a slight arch in your low back and your hips straight forward.  Lead with your chest and lean forward at the waist until you feel a gentle stretch in the back of your right / left knee or thigh. (When done correctly, this exercise requires leaning only a small distance.)  Hold this position for __________ seconds. Repeat __________  times. Complete this stretch __________ times per day. STRENGTHENING - Deep Abdominals, Pelvic Tilt   Lie on a firm bed or floor. Keeping your legs in front of you, bend your knees so they are both pointed toward the ceiling and your feet are flat on the floor.  Tense your lower abdominal muscles to press your low back into the floor. This motion will rotate your pelvis so that your tail bone is scooping upwards rather than pointing at your feet or into the floor.  With a gentle tension and even breathing, hold this position for __________ seconds. Repeat __________ times. Complete this exercise __________ times per day.  STRENGTHENING - Abdominals, Crunches   Lie on a firm bed or floor. Keeping your legs in front of  you, bend your knees so they are both pointed toward the ceiling and your feet are flat on the floor. Cross your arms over your chest.  Slightly tip your chin down without bending your neck.  Tense your abdominals and slowly lift your trunk high enough to just clear your shoulder blades. Lifting higher can put excessive stress on the low back and does not further strengthen your abdominal muscles.  Control your return to the starting position. Repeat __________ times. Complete this exercise __________ times per day.  STRENGTHENING - Quadruped, Opposite UE/LE Lift   Assume a hands and knees position on a firm surface. Keep your hands under your shoulders and your knees under your hips. You may place padding under your knees for comfort.  Find your neutral spine and gently tense your abdominal muscles so that you can maintain this position. Your shoulders and hips should form a rectangle that is parallel with the floor and is not twisted.  Keeping your trunk steady, lift your right hand no higher than your shoulder and then your left leg no higher than your hip. Make sure you are not holding your breath. Hold this position __________ seconds.  Continuing to keep your abdominal muscles tense and your back steady, slowly return to your starting position. Repeat with the opposite arm and leg. Repeat __________ times. Complete this exercise __________ times per day. Document Released: 05/19/2005 Document Revised: 07/24/2011 Document Reviewed: 08/13/2008 [facility] Patient Information 2015 [place], [place]. This information is not intended to replace advice given to you by your health care provider. Make sure you discuss any questions you have with your health care provider.

## 2015-02-04 ENCOUNTER — Ambulatory Visit: Payer: Medicaid Other | Admitting: Cardiovascular Disease

## 2015-02-22 NOTE — Progress Notes (Signed)
Cardiology Office Note   Date:  02/23/2015   ID:  Jamie Palmer, DOB 1985-06-20, MRN [id number]  PCP:  Jearld Lesch, MD  Cardiologist:   Madilyn Hook, MD   Chief Complaint  Patient presents with  . Advice Only    patient reports having dizzy spells. patient was seen in the ED about 1 month ago. states heart rate stays in the 40's range. denies having chest pain.Marland Kitchen drinks caffiene daily  . New Evaluation      History of Present Illness: Jamie Palmer is a 29 y.o. female with gestational diabetes who presents for an evaluation of dizziness.  Jamie Palmer was seen in the ED on 01/15/15 with dizziness.  When the episode occurred she was driving her car. She was able to pull over and check her vitals.  She noted that her heart rate was in the 40s. At the time she was feeling lightheaded and tired. The episode lasted for over an hour so she presented to the ED. In the ED she was noted to have a heart rate of 43. Per her, they did not admit her because she did not crash her car or actually lose consciousness. However she was referred to cardiology. Since that episode she continues to have daily episodes of lightheadedness and dizziness. The episodes last between 30 minutes to an hour. It usually goes away on its own without any intervention. She's been feeling too fatigued to exercise, as she usually likes to go walking each morning. However this makes her feel dizzy and more tired than usual. She checks her blood sugar and notes that it is not been low during any of these episodes.  Jamie Palmer denies chest pain or exertional shortness of breath. She does feel short of breath when laying down in bed at night. She is unable and her back and aggressively on her side. She has a family history of coronary artery disease but no history of anyone requiring a pacemaker.    Past Medical History  Diagnosis Date  . Depression   . Gestational diabetes     glyburide  . Anxiety   .  Shingles   . ADHD (attention deficit hyperactivity disorder)   . Asthma     Uses inhaler prn    Past Surgical History  Procedure Laterality Date  . Appendectomy    . Foot surgery    . Tubal ligation Bilateral 11/27/2014    Procedure: POST PARTUM TUBAL LIGATION;  Surgeon: Lavina Hamman, MD;  Location: [facility];  Service: Gynecology;  Laterality: Bilateral;     Current Outpatient Prescriptions  Medication Sig Dispense Refill  . albuterol (PROVENTIL HFA;VENTOLIN HFA) 108 (90 BASE) MCG/ACT inhaler 1-2 puffs every 6 hours while awake for the next 36 hours then prn cough/wheeze/sob 1 Inhaler 1  . cephALEXin (KEFLEX) 500 MG capsule Take 1 capsule (500 mg total) by mouth 2 (two) times daily. (Patient not taking: Reported on 02/23/2015) 10 capsule 0  . ibuprofen (ADVIL,MOTRIN) 600 MG tablet Take 1 tablet (600 mg total) by mouth every 6 (six) hours. (Patient not taking: Reported on 02/23/2015) 30 tablet 0  . methocarbamol (ROBAXIN) 500 MG tablet Take 1 tablet (500 mg total) by mouth 2 (two) times daily. (Patient not taking: Reported on 02/23/2015) 20 tablet 0  . naproxen (NAPROSYN) 500 MG tablet Take 1 tablet (500 mg total) by mouth 2 (two) times daily. (Patient not taking: Reported on 02/23/2015) 30 tablet 0  . oxyCODONE-acetaminophen (PERCOCET/ROXICET) 5-325 MG  per tablet Take 1-2 tablets by mouth every 4 (four) hours as needed for severe pain. (Patient not taking: Reported on 02/23/2015) 30 tablet 0  . pantoprazole (PROTONIX) 40 MG tablet Take 40 mg by mouth daily.  12  . Prenatal Vit-Fe Fumarate-FA (PRENATAL MULTIVITAMIN) TABS tablet Take 1 tablet by mouth at bedtime.    Marland Kitchen Spacer/Aero-Holding Rudean Curt Use as directed (Patient not taking: Reported on 02/23/2015) 1 each 1  . traMADol (ULTRAM) 50 MG tablet Take 1 tablet (50 mg total) by mouth every 6 (six) hours as needed. (Patient not taking: Reported on 02/23/2015) 15 tablet 0   No current facility-administered medications for this visit.     Allergies:   Review of patient's allergies indicates no known allergies.    Social History:  The patient  reports that she has been smoking Cigarettes.  She has a 10 pack-year smoking history. She has never used smokeless tobacco. She reports that she does not drink alcohol or use illicit drugs.   Family History:  The patient's family history includes Asthma in her other; COPD in her other; Cancer in her father, maternal grandfather, maternal grandmother, mother, and other; Diabetes in her maternal aunt and other; Heart attack in her maternal aunt and other; Heart disease in her maternal aunt and other; Hyperlipidemia in her other; Hypertension in her maternal aunt and other; Obesity in her other. There is no history of Other.    ROS:  Please see the history of present illness.   Otherwise, review of systems are positive for none.   All other systems are reviewed and negative.    PHYSICAL EXAM: VS:  BP 127/83 mmHg  Pulse 66  Ht  (1.676 m)  Wt 92.307 kg (203 lb 8 oz)  BMI 32.86 kg/m2 , BMI Body mass index is 32.86 kg/(m^2). GENERAL:  Well appearing HEENT:  Pupils equal round and reactive, fundi not visualized, oral mucosa unremarkable NECK:  No jugular venous distention, waveform within normal limits, carotid upstroke brisk and symmetric, no bruits, no thyromegaly LYMPHATICS:  No cervical adenopathy LUNGS:  Clear to auscultation bilaterally HEART:  RRR.  PMI not displaced or sustained,S1 and S2 within normal limits, no S3, no S4, no clicks, no rubs, II/VI systolic murmur at the LUSB and LLSB.   ABD:  Flat, positive bowel sounds normal in frequency in pitch, no bruits, no rebound, no guarding, no midline pulsatile mass, no hepatomegaly, no splenomegaly EXT:  2 plus pulses throughout, no edema, no cyanosis no clubbing SKIN:  No rashes no nodules NEURO:  Cranial nerves II through XII grossly intact, motor grossly intact throughout PSYCH:  Cognitively intact, oriented to person place  and time    EKG:  EKG is not ordered today. ECG 01/16/15 sinus rhythm and sinus arrhythmia at 66 bpm.   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  There are no clear causes for her symptoms.  We discussed the fact that the most common treatment for this is a pacemaker. However it is extremely uncommon to place a pacemaker and a 29 year old person.  We would like to avoid this if at all possible. We will check her thyroid function as well as an echo to evaluate for structural heart disease. We'll also obtain a 24 hour Holter to document the extent of her bradycardia and to associate her symptoms with the heart rate at the time. We will refer her to Dr. Royann Shivers for consideration of a PPM.  # CV Disease Prevention: Check lipid panel and LFTs given that she had gestational diabetes and has not been screened for hyperlipidemia:  # Tobacco abuse: Jamie Palmer is interested in trying to quit smoking.  She was able to quit successfully with nicotine replacement therapy in the past.  She had bad dreams and worsened depression on Chantix.  She will try the patches again. We spent 10 minutes discussing smoking cessation.     Current medicines are reviewed at length with the patient today.  The patient does not have concerns regarding medicines.  The following changes have been made:  no change  Labs/ tests ordered today include:   Orders Placed This Encounter  Procedures  . Lipid panel  . Hepatic function panel  . T4, free  . TSH  . Holter monitor - 24 hour     Disposition:   FU with  Velmer Woelfel C. Duke Salvia, MD in 3 months, Dr. Royann Shivers in 1 month.   Signed, Madilyn Hook, MD  02/23/2015 9:31 AM    [facility]

## 2015-02-23 ENCOUNTER — Encounter (INDEPENDENT_AMBULATORY_CARE_PROVIDER_SITE_OTHER): Payer: Medicaid Other

## 2015-02-23 ENCOUNTER — Ambulatory Visit (INDEPENDENT_AMBULATORY_CARE_PROVIDER_SITE_OTHER): Payer: Medicaid Other | Admitting: Cardiovascular Disease

## 2015-02-23 ENCOUNTER — Encounter: Payer: Self-pay | Admitting: Cardiovascular Disease

## 2015-02-23 ENCOUNTER — Other Ambulatory Visit: Payer: Self-pay | Admitting: *Deleted

## 2015-02-23 VITALS — BP 127/83 | HR 66 | Ht 66.0 in | Wt 203.5 lb

## 2015-02-23 DIAGNOSIS — Z13220 Encounter for screening for lipoid disorders: Secondary | ICD-10-CM

## 2015-02-23 DIAGNOSIS — R011 Cardiac murmur, unspecified: Secondary | ICD-10-CM | POA: Diagnosis not present

## 2015-02-23 DIAGNOSIS — R0602 Shortness of breath: Secondary | ICD-10-CM | POA: Diagnosis not present

## 2015-02-23 DIAGNOSIS — Z79899 Other long term (current) drug therapy: Secondary | ICD-10-CM

## 2015-02-23 DIAGNOSIS — R001 Bradycardia, unspecified: Secondary | ICD-10-CM

## 2015-02-23 HISTORY — DX: Bradycardia, unspecified: R00.1

## 2015-02-23 MED ORDER — NICOTINE 14 MG/24HR TD PT24: 14.0000 mg | MEDICATED_PATCH | Freq: Every day | TRANSDERMAL | Status: DC

## 2015-02-23 MED ORDER — NICOTINE 7 MG/24HR TD PT24: 7.0000 mg | MEDICATED_PATCH | Freq: Every day | TRANSDERMAL | Status: DC

## 2015-02-23 MED ORDER — NICOTINE 21 MG/24HR TD PT24: 21.0000 mg | MEDICATED_PATCH | Freq: Every day | TRANSDERMAL | Status: DC

## 2015-02-23 NOTE — Patient Instructions (Signed)
LABS - LIPID, LIVER PANEL, TSH ,T4 FREE,   Your physician has requested that you have an echocardiogram [address]. Echocardiography is a painless test that uses sound waves to create images of your heart. It provides your doctor with information about the size and shape of your heart and how well your heart's chambers and valves are working. This procedure takes approximately one hour. There are no restrictions for this procedure.  Your physician has recommended that you wear a holter monitor for  24 HOUR then return to office . Holter monitors are medical devices that record the heart's electrical activity. Doctors most often use these monitors to diagnose arrhythmias. Arrhythmias are problems with the speed or rhythm of the heartbeat. The monitor is a small, portable device. You can wear one while you do your normal daily activities. This is usually used to diagnose what is causing palpitations/syncope (passing out).  You have been referred to  DR CROITORU - BRADYCARDIA - MONITOR WORN.  NICOTINE PATCH   FOR 6 WEEKS - 21 MG PATCH , 2 WEEKS 14 MG PATCH , 2 WEEKS 7 MG PATCH  Your physician wants you to follow-up in 3 MONTHS WITH DR Alpine.

## 2015-02-25 LAB — LIPID PANEL
CHOL/HDL RATIO: 4.4 ratio (ref <=5.0)
Cholesterol: 199 mg/dL (ref 125–200)
HDL: 45 mg/dL — AB (ref >=46)
LDL CALC: 120 mg/dL (ref <130)
TRIGLYCERIDES: 171 mg/dL — AB (ref <150)
VLDL: 34 mg/dL — ABNORMAL HIGH (ref <30)

## 2015-02-25 LAB — T4, FREE: FREE T4: 1.14 ng/dL (ref 0.80–1.80)

## 2015-02-25 LAB — HEPATIC FUNCTION PANEL
ALBUMIN: 4.3 g/dL (ref 3.6–5.1)
ALK PHOS: 64 U/L (ref 33–115)
ALT: 12 U/L (ref 6–29)
AST: 21 U/L (ref 10–30)
BILIRUBIN TOTAL: 0.3 mg/dL (ref 0.2–1.2)
Bilirubin, Direct: 0.1 mg/dL (ref <=0.2)
Indirect Bilirubin: 0.2 mg/dL (ref 0.2–1.2)
TOTAL PROTEIN: 6.6 g/dL (ref 6.1–8.1)

## 2015-02-25 LAB — TSH: TSH: 0.859 u[IU]/mL (ref 0.350–4.500)

## 2015-02-26 ENCOUNTER — Telehealth: Payer: Self-pay | Admitting: *Deleted

## 2015-02-26 NOTE — Telephone Encounter (Signed)
Spoke to patient. Result given . Verbalized understanding  

## 2015-02-26 NOTE — Telephone Encounter (Signed)
-----   Message from Chilton Siiffany Crittenden, MD sent at 02/25/2015 11:05 PM EDT ----- Cholesterol numbers are slightly elevated.  Increase exercise to 30-40 minutes most days of the week, limit carbohydrates and fried foods.  Increase fruits and vegetables.

## 2015-03-10 ENCOUNTER — Ambulatory Visit (HOSPITAL_COMMUNITY): Payer: Medicaid Other | Attending: Cardiology

## 2015-03-10 ENCOUNTER — Telehealth: Payer: Self-pay | Admitting: Cardiovascular Disease

## 2015-03-10 ENCOUNTER — Other Ambulatory Visit: Payer: Self-pay

## 2015-03-10 DIAGNOSIS — I071 Rheumatic tricuspid insufficiency: Secondary | ICD-10-CM | POA: Diagnosis not present

## 2015-03-10 DIAGNOSIS — I340 Nonrheumatic mitral (valve) insufficiency: Secondary | ICD-10-CM | POA: Diagnosis not present

## 2015-03-10 DIAGNOSIS — R0602 Shortness of breath: Secondary | ICD-10-CM | POA: Diagnosis not present

## 2015-03-10 DIAGNOSIS — R011 Cardiac murmur, unspecified: Secondary | ICD-10-CM

## 2015-03-10 NOTE — Telephone Encounter (Signed)
Spoke to patient.  Holter monitorResult given . Verbalized understanding She aware to keep appointment with Dr Royann Shiversroitoru and Dr Duke SalviaANDOLPH as schedule

## 2015-03-10 NOTE — Telephone Encounter (Signed)
-----   Message from Jamie Siiffany Dickens, MD sent at 03/09/2015  5:24 PM EDT ----- Study showed occasional extra beats from both the top and bottom chambers of the heart.  Slowest heart rate was 38 bpm at 6 am.  Keep follow up with Dr. Salena Saner and me as scheduled.

## 2015-03-10 NOTE — Telephone Encounter (Signed)
Returning your call, concerning her monitor results.

## 2015-03-11 ENCOUNTER — Telehealth: Payer: Self-pay | Admitting: *Deleted

## 2015-03-11 NOTE — Telephone Encounter (Signed)
-----   Message from Chilton Siiffany Clovis, MD sent at 03/11/2015  8:06 AM EDT ----- Normal echo.

## 2015-03-11 NOTE — Telephone Encounter (Signed)
Spoke to patient. Result given . Verbalized understanding  

## 2015-03-23 ENCOUNTER — Ambulatory Visit (INDEPENDENT_AMBULATORY_CARE_PROVIDER_SITE_OTHER): Payer: Medicaid Other | Admitting: Cardiovascular Disease

## 2015-03-23 ENCOUNTER — Encounter: Payer: Self-pay | Admitting: Cardiovascular Disease

## 2015-03-23 VITALS — BP 120/62 | HR 83 | Ht 66.0 in | Wt 195.0 lb

## 2015-03-23 DIAGNOSIS — R001 Bradycardia, unspecified: Secondary | ICD-10-CM | POA: Diagnosis not present

## 2015-03-23 NOTE — Patient Instructions (Signed)
FOLLOW-UP WITH DR. [place]
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
## 2015-05-25 NOTE — Progress Notes (Signed)
This encounter was created in error - please disregard.

## 2015-05-26 ENCOUNTER — Encounter: Payer: Medicaid Other | Admitting: Cardiovascular Disease
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
## 2015-06-10 ENCOUNTER — Encounter: Payer: Self-pay | Admitting: Cardiovascular Disease

## 2015-06-10 ENCOUNTER — Ambulatory Visit (INDEPENDENT_AMBULATORY_CARE_PROVIDER_SITE_OTHER): Payer: Medicaid Other | Admitting: Cardiovascular Disease

## 2015-06-10 VITALS — BP 115/74 | HR 62 | Ht 66.0 in | Wt 188.8 lb

## 2015-06-10 DIAGNOSIS — Z79899 Other long term (current) drug therapy: Secondary | ICD-10-CM

## 2015-06-10 DIAGNOSIS — Z720 Tobacco use: Secondary | ICD-10-CM

## 2015-06-10 DIAGNOSIS — R42 Dizziness and giddiness: Secondary | ICD-10-CM | POA: Diagnosis not present

## 2015-06-10 DIAGNOSIS — R001 Bradycardia, unspecified: Secondary | ICD-10-CM

## 2015-06-10 HISTORY — DX: Dizziness and giddiness: R42

## 2015-06-10 MED ORDER — FLUDROCORTISONE ACETATE 0.1 MG PO TABS: 0.1000 mg | ORAL_TABLET | Freq: Every day | ORAL | Status: DC

## 2015-06-10 NOTE — Patient Instructions (Signed)
Start florinef 0.1 mg one tablet daily.  In one week - go to lab for lab draw bmp-anytime of the day.  Your physician recommends that you schedule a follow-up appointment in 3 months with Dr Duke Salvia.  If you need a refill on your cardiac medications before your next appointment, please call your pharmacy.

## 2015-11-05 ENCOUNTER — Encounter (HOSPITAL_COMMUNITY): Payer: Self-pay | Admitting: Emergency Medicine

## 2015-11-05 ENCOUNTER — Ambulatory Visit (HOSPITAL_COMMUNITY)
Admission: EM | Admit: 2015-11-05 | Discharge: 2015-11-05 | Disposition: A | Discharge status: Home / Self Care | Payer: Medicaid Other | Attending: Emergency Medicine | Admitting: Emergency Medicine

## 2015-11-05 DIAGNOSIS — F17210 Nicotine dependence, cigarettes, uncomplicated: Secondary | ICD-10-CM | POA: Insufficient documentation

## 2015-11-05 DIAGNOSIS — N760 Acute vaginitis: Secondary | ICD-10-CM | POA: Diagnosis not present

## 2015-11-05 DIAGNOSIS — Z113 Encounter for screening for infections with a predominantly sexual mode of transmission: Secondary | ICD-10-CM | POA: Insufficient documentation

## 2015-11-05 DIAGNOSIS — F329 Major depressive disorder, single episode, unspecified: Secondary | ICD-10-CM | POA: Insufficient documentation

## 2015-11-05 DIAGNOSIS — E119 Type 2 diabetes mellitus without complications: Secondary | ICD-10-CM | POA: Diagnosis not present

## 2015-11-05 DIAGNOSIS — F909 Attention-deficit hyperactivity disorder, unspecified type: Secondary | ICD-10-CM | POA: Diagnosis not present

## 2015-11-05 DIAGNOSIS — Z9889 Other specified postprocedural states: Secondary | ICD-10-CM | POA: Insufficient documentation

## 2015-11-05 DIAGNOSIS — F419 Anxiety disorder, unspecified: Secondary | ICD-10-CM | POA: Diagnosis not present

## 2015-11-05 DIAGNOSIS — J45909 Unspecified asthma, uncomplicated: Secondary | ICD-10-CM | POA: Diagnosis not present

## 2015-11-05 DIAGNOSIS — N898 Other specified noninflammatory disorders of vagina: Secondary | ICD-10-CM | POA: Diagnosis present

## 2015-11-05 LAB — POCT URINALYSIS DIP (DEVICE)
Bilirubin Urine: NEGATIVE
GLUCOSE, UA: NEGATIVE mg/dL
HGB URINE DIPSTICK: NEGATIVE
Ketones, ur: NEGATIVE mg/dL
Nitrite: NEGATIVE
PH: 6 (ref 5.0–8.0)
PROTEIN: NEGATIVE mg/dL
Specific Gravity, Urine: 1.025 (ref 1.005–1.030)
Urobilinogen, UA: 0.2 mg/dL (ref 0.0–1.0)

## 2015-11-05 LAB — POCT PREGNANCY, URINE: PREG TEST UR: NEGATIVE

## 2015-11-05 MED ORDER — METRONIDAZOLE 500 MG PO TABS: 500.0000 mg | ORAL_TABLET | Freq: Two times a day (BID) | ORAL | Status: DC
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
## 2015-11-05 NOTE — ED Provider Notes (Signed)
HPI  SUBJECTIVE:  Jamie Palmer is a 30 y.o. female who presents for an STD check. States her partner told her yesterday that he had been exposed to Trichomonas. She reports odorous vaginal discharge for 1 week, starting after she finished menses. No urgency, frequency, dysuria, oderous urine, hematuria,  genital blisters, vaginal itching. No aggravating, alleviating factors. Has not tried anything for this. No fevers, N/V, abd pain, back pain. Patient douches, but denies any recent antibiotics, perfumed body washes. Pt is in a longterm monogamous sexual relationship with a female partner who is asxatic, however this partner was the one exposed to Trichomonas.  does not use condoms. STD's a concern today. She has a  past medical history of diabetes, that is controlled through diet and weight loss.  states that she has not needed to take her medicine in 2 months, also h/o vaginal yeast infections, shingles. No history of BV gonorrhea chlamydia, Trichomonas, No h/o syphilis, herpes, HIV. No h/o PID, ectopic pregnancy. PMD: Dr. Mayford KnifeWilliams.    Past Medical History  Diagnosis Date  . Depression   . Gestational diabetes     glyburide  . Anxiety   . Shingles   . ADHD (attention deficit hyperactivity disorder)   . Asthma     Uses inhaler prn  . Bradycardia 02/23/2015  . Dizziness 06/10/2015    Past Surgical History  Procedure Laterality Date  . Appendectomy    . Foot surgery    . Tubal ligation Bilateral 11/27/2014    Procedure: POST PARTUM TUBAL LIGATION;  Surgeon: Lavina Hammanodd Meisinger, MD;  Location: [facility];  Service: Gynecology;  Laterality: Bilateral;    Family History  Problem Relation Age of Onset  . Diabetes Other   . Hypertension Other   . Heart disease Other   . Cancer Other   . COPD Other   . Asthma Other   . Heart attack Other   . Obesity Other   . Hyperlipidemia Other   . Other Neg Hx   . Cancer Mother     lung  . Cancer Maternal Grandmother     lung  . Cancer Father   .  Heart attack Maternal Aunt   . Hypertension Maternal Aunt   . Diabetes Maternal Aunt   . Heart disease Maternal Aunt   . Cancer Maternal Grandfather     lung    Social History  Substance Use Topics  . Smoking status: Current Every Day Smoker -- 1.00 packs/day for 10 years    Types: Cigarettes  . Smokeless tobacco: Never Used  . Alcohol Use: No    No current facility-administered medications for this encounter.  Current outpatient prescriptions:  .  albuterol (PROVENTIL HFA;VENTOLIN HFA) 108 (90 BASE) MCG/ACT inhaler, 1-2 puffs every 6 hours while awake for the next 36 hours then prn cough/wheeze/sob, Disp: 1 Inhaler, Rfl: 1 .  ALPRAZolam (XANAX) 1 MG tablet, Take 1 mg by mouth at bedtime as needed for anxiety., Disp: , Rfl:  .  fludrocortisone (FLORINEF) 0.1 MG tablet, Take 1 tablet (0.1 mg total) by mouth daily., Disp: 30 tablet, Rfl: 6 .  metFORMIN (GLUCOPHAGE) 500 MG tablet, Take 500 mg by mouth 2 (two) times daily with a meal., Disp: , Rfl:  .  metroNIDAZOLE (FLAGYL) 500 MG tablet, Take 1 tablet (500 mg total) by mouth 2 (two) times daily. X 7 days, Disp: 14 tablet, Rfl: 1 .  pantoprazole (PROTONIX) 40 MG tablet, Take 40 mg by mouth daily., Disp: , Rfl: 12  No Known Allergies   ROS  As noted in HPI.   Physical Exam  BP 134/77 mmHg  Pulse 76  Temp(Src) 98.5 F (36.9 C) (Oral)  Resp 16  SpO2 98%  LMP 10/22/2015 (Exact Date)  Constitutional: Well developed, well nourished, no acute distress Eyes:  EOMI, conjunctiva normal bilaterally HENT: Normocephalic, atraumatic,mucus membranes moist Respiratory: Normal inspiratory effort Cardiovascular: Normal rate GI: nondistended soft, nontender. No suprapubic tenderness  GU: External genitalia normal.  Normal vaginal mucosa.  Normal os. Thin oderous  White vaginal discharge.   Uterus smooth NT. No CMT. No adnexal tenderness. No adnexal masses.  Chaperone present during exam skin: No rash, skin intact Musculoskeletal: no  deformities Neurologic: Alert & oriented x 3, no focal neuro deficits Psychiatric: Speech and behavior appropriate   ED Course   Medications - No data to display  Orders Placed This Encounter  Procedures  . Pelvic exam    Standing Status: Standing     Number of Occurrences: 1     Standing Expiration Date:   . HIV antibody    Standing Status: Standing     Number of Occurrences: 1     Standing Expiration Date:   . RPR    Standing Status: Standing     Number of Occurrences: 1     Standing Expiration Date:   . POCT urinalysis dip (device)    Standing Status: Standing     Number of Occurrences: 1     Standing Expiration Date:   . Pregnancy, urine POC    Standing Status: Standing     Number of Occurrences: 1     Standing Expiration Date:     Results for orders placed or performed during the hospital encounter of 11/05/15 (from the past 24 hour(s))  POCT urinalysis dip (device)     Status: Abnormal   Collection Time: 11/05/15  5:54 PM  Result Value Ref Range   Glucose, UA NEGATIVE NEGATIVE mg/dL   Bilirubin Urine NEGATIVE NEGATIVE   Ketones, ur NEGATIVE NEGATIVE mg/dL   Specific Gravity, Urine 1.025 1.005 - 1.030   Hgb urine dipstick NEGATIVE NEGATIVE   pH 6.0 5.0 - 8.0   Protein, ur NEGATIVE NEGATIVE mg/dL   Urobilinogen, UA 0.2 0.0 - 1.0 mg/dL   Nitrite NEGATIVE NEGATIVE   Leukocytes, UA TRACE (A) NEGATIVE  Pregnancy, urine POC     Status: None   Collection Time: 11/05/15  6:01 PM  Result Value Ref Range   Preg Test, Ur NEGATIVE NEGATIVE   No results found.  ED Clinical Impression  Vaginitis  Screening examination for STD (sexually transmitted disease)  ED Assessment/Plan    Pregnancy negative. Trace leukocytes, but negative nitrite, hematuria. Has no urinary complaints. Will not treat this.  H&P most c/w BV.  Sent off GC/chlamydia, wet prep, HIV, RPR. Will not treat empirically now. Will send home with flagyl, with one refill. Advised pt to refrain from  sexual contact until she  knows lab results, symptoms resolve, and partner(s) are treated if necessary. Pt provided working phone number. Discussed labs, MDM, plan and followup with patient  Pt agrees with plan.     *This clinic note was created using Dragon dictation software. Therefore, there may be occasional mistakes despite careful proofreading.  ?    Domenick GongAshley Clotilde Loth, MD 11/05/15 1925

## 2015-11-05 NOTE — ED Notes (Signed)
[facility] a [facility] of a possible exposure to trichomonas about 2 months prior. The patient stated that she is having a vaginal discharge but denied any dysuria.

## 2015-11-07 LAB — RPR: RPR Ser Ql: NONREACTIVE

## 2015-11-07 LAB — HIV ANTIBODY (ROUTINE TESTING W REFLEX): HIV Screen 4th Generation wRfx: NONREACTIVE

## 2015-11-08 LAB — CERVICOVAGINAL ANCILLARY ONLY
Chlamydia: NEGATIVE
Neisseria Gonorrhea: NEGATIVE

## 2015-11-09 LAB — CERVICOVAGINAL ANCILLARY ONLY: Wet Prep (BD Affirm): POSITIVE — AB

## 2015-11-11 ENCOUNTER — Telehealth (HOSPITAL_COMMUNITY): Payer: Self-pay | Admitting: Emergency Medicine

## 2015-11-11 NOTE — ED Notes (Signed)
Called pt and notified of recent lab results from visit 6/23 Pt ID'd properly... Reports feeling better but still having vag d/c.  Reports she has not p/u Flagyl yet... Adv her to p/u and take it if vag d/c still persists.  Adv pt if sx are not getting better to return  Pt verb understanding Education on safe sex given  Per Dr. Dayton ScrapeMurray,  Notes Recorded by Eustace MooreLaura W Murray, MD on 11/09/2015 at 8:33 AM Please let patient know that tests for HIV, syphilis (rpr), gonorrhea/chlamydia were negative. Recheck as needed for persistent symptoms. LM

## 2016-02-20 ENCOUNTER — Emergency Department (HOSPITAL_COMMUNITY)
Admission: EM | Admit: 2016-02-20 | Discharge: 2016-02-20 | Disposition: A | Discharge status: Home / Self Care | Payer: No Typology Code available for payment source | Attending: Emergency Medicine | Admitting: Emergency Medicine

## 2016-02-20 ENCOUNTER — Emergency Department (HOSPITAL_COMMUNITY): Payer: No Typology Code available for payment source

## 2016-02-20 ENCOUNTER — Encounter (HOSPITAL_COMMUNITY): Payer: Self-pay | Admitting: Nurse Practitioner

## 2016-02-20 DIAGNOSIS — Z79899 Other long term (current) drug therapy: Secondary | ICD-10-CM | POA: Diagnosis not present

## 2016-02-20 DIAGNOSIS — Y92410 Unspecified street and highway as the place of occurrence of the external cause: Secondary | ICD-10-CM | POA: Diagnosis not present

## 2016-02-20 DIAGNOSIS — F17210 Nicotine dependence, cigarettes, uncomplicated: Secondary | ICD-10-CM | POA: Insufficient documentation

## 2016-02-20 DIAGNOSIS — M546 Pain in thoracic spine: Secondary | ICD-10-CM | POA: Diagnosis present

## 2016-02-20 DIAGNOSIS — Y9389 Activity, other specified: Secondary | ICD-10-CM | POA: Insufficient documentation

## 2016-02-20 DIAGNOSIS — J45909 Unspecified asthma, uncomplicated: Secondary | ICD-10-CM | POA: Diagnosis not present

## 2016-02-20 DIAGNOSIS — Y999 Unspecified external cause status: Secondary | ICD-10-CM | POA: Diagnosis not present

## 2016-02-20 DIAGNOSIS — F909 Attention-deficit hyperactivity disorder, unspecified type: Secondary | ICD-10-CM | POA: Insufficient documentation

## 2016-02-20 DIAGNOSIS — M542 Cervicalgia: Secondary | ICD-10-CM | POA: Insufficient documentation

## 2016-02-20 MED ORDER — CYCLOBENZAPRINE HCL 10 MG PO TABS: 10.0000 mg | ORAL_TABLET | Freq: Two times a day (BID) | ORAL | 0 refills | Status: DC | PRN

## 2016-02-20 MED ORDER — IBUPROFEN 800 MG PO TABS
800.0000 mg | ORAL_TABLET | Freq: Once | ORAL | Status: AC
  Administered 2016-02-20: 800 mg via ORAL
  Filled 2016-02-20: qty 1

## 2016-02-20 MED ORDER — IBUPROFEN 800 MG PO TABS: 800.0000 mg | ORAL_TABLET | Freq: Three times a day (TID) | ORAL | 0 refills | Status: DC

## 2016-02-20 MED ORDER — OXYCODONE-ACETAMINOPHEN 5-325 MG PO TABS
1.0000 | ORAL_TABLET | Freq: Once | ORAL | Status: AC
  Administered 2016-02-20: 1 via ORAL
  Filled 2016-02-20: qty 1

## 2016-02-20 MED ORDER — CYCLOBENZAPRINE HCL 10 MG PO TABS
5.0000 mg | ORAL_TABLET | Freq: Once | ORAL | Status: AC
  Administered 2016-02-20: 5 mg via ORAL
  Filled 2016-02-20: qty 1
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
## 2016-02-27 ENCOUNTER — Emergency Department (HOSPITAL_COMMUNITY)
Admission: EM | Admit: 2016-02-27 | Discharge: 2016-02-27 | Disposition: A | Discharge status: Home / Self Care | Payer: No Typology Code available for payment source

## 2016-02-27 NOTE — ED Notes (Addendum)
Pt said she had to use the bathroom so I said ok come to this bathroom cause I need a urine sample. She said Ill use the bathroom out in the waiting room and I said just use the one back here cause we need a sample and she said Nah Im good and walked out.

## 2016-06-22 ENCOUNTER — Emergency Department
Admission: EM | Admit: 2016-06-22 | Discharge: 2016-06-22 | Disposition: A | Discharge status: Home / Self Care | Payer: Medicaid Other | Attending: Emergency Medicine | Admitting: Emergency Medicine

## 2016-06-22 DIAGNOSIS — J111 Influenza due to unidentified influenza virus with other respiratory manifestations: Secondary | ICD-10-CM | POA: Diagnosis not present

## 2016-06-22 DIAGNOSIS — J45909 Unspecified asthma, uncomplicated: Secondary | ICD-10-CM | POA: Diagnosis not present

## 2016-06-22 DIAGNOSIS — F17290 Nicotine dependence, other tobacco product, uncomplicated: Secondary | ICD-10-CM | POA: Diagnosis not present

## 2016-06-22 DIAGNOSIS — Z7984 Long term (current) use of oral hypoglycemic drugs: Secondary | ICD-10-CM | POA: Insufficient documentation

## 2016-06-22 DIAGNOSIS — F909 Attention-deficit hyperactivity disorder, unspecified type: Secondary | ICD-10-CM | POA: Diagnosis not present

## 2016-06-22 DIAGNOSIS — Z791 Long term (current) use of non-steroidal anti-inflammatories (NSAID): Secondary | ICD-10-CM | POA: Diagnosis not present

## 2016-06-22 DIAGNOSIS — R05 Cough: Secondary | ICD-10-CM | POA: Diagnosis present

## 2016-06-22 MED ORDER — ALBUTEROL SULFATE HFA 108 (90 BASE) MCG/ACT IN AERS: 2.0000 | INHALATION_SPRAY | Freq: Four times a day (QID) | RESPIRATORY_TRACT | 0 refills | Status: DC | PRN

## 2016-06-22 MED ORDER — IPRATROPIUM-ALBUTEROL 0.5-2.5 (3) MG/3ML IN SOLN
3.0000 mL | Freq: Once | RESPIRATORY_TRACT | Status: AC
  Administered 2016-06-22: 3 mL via RESPIRATORY_TRACT
  Filled 2016-06-22: qty 3

## 2016-06-22 MED ORDER — NAPROXEN 500 MG PO TABS: 500.0000 mg | ORAL_TABLET | Freq: Two times a day (BID) | ORAL | 0 refills | Status: DC

## 2016-06-22 NOTE — ED Provider Notes (Signed)
[facility] Emergency Department Provider Note  ____________________________________________  Time seen: Approximately 12:29 PM  I have reviewed the triage vital signs and the nursing notes.   HISTORY  Chief Complaint Generalized Body Aches and URI    HPI Jamie Palmer is a 31 y.o. female that presents to the emergency department with cough, congestion, fatigue, muscle aches, nausea for 3 days. Patient states that she vomited twice yesterday and had an episode of diarrhea this morning. She is unsure of fever but she has chills. Patient states that the first day she thought she would wait it out but symptoms have not improved.Patient has not taken anything for symptoms. Patient denies fever, headache, shortness of breath, chest pain, abdominal pain.   Past Medical History:  Diagnosis Date  . ADHD (attention deficit hyperactivity disorder)   . Anxiety   . Asthma    Uses inhaler prn  . Bradycardia 02/23/2015  . Depression   . Dizziness 06/10/2015  . Gestational diabetes    glyburide  . Shingles     Patient Active Problem List   Diagnosis Date Noted  . Dizziness 06/10/2015  . Bradycardia 02/23/2015  . Diabetes mellitus during pregnancy in third trimester 11/26/2014  . Gestational diabetes 03/29/2012  . SVD (spontaneous vaginal delivery) 03/29/2012    Past Surgical History:  Procedure Laterality Date  . APPENDECTOMY    . FOOT SURGERY    . TUBAL LIGATION Bilateral 11/27/2014   Procedure: POST PARTUM TUBAL LIGATION;  Surgeon: Lavina Hamman, MD;  Location: [facility];  Service: Gynecology;  Laterality: Bilateral;    Prior to Admission medications   Medication Sig Start Date End Date Taking? Authorizing Provider  albuterol (PROVENTIL HFA;VENTOLIN HFA) 108 (90 BASE) MCG/ACT inhaler 1-2 puffs every 6 hours while awake for the next 36 hours then prn cough/wheeze/sob 08/25/14   Marny Lowenstein, PA-C  albuterol (PROVENTIL HFA;VENTOLIN HFA) 108 (90 Base)  MCG/ACT inhaler Inhale 2 puffs into the lungs every 6 (six) hours as needed for wheezing or shortness of breath. 06/22/16   Enid Derry, PA-C  ALPRAZolam Prudy Feeler) 1 MG tablet Take 1 mg by mouth at bedtime as needed for anxiety.    Historical Provider, MD  cyclobenzaprine (FLEXERIL) 10 MG tablet Take 1 tablet (10 mg total) by mouth 2 (two) times daily as needed for muscle spasms. 02/20/16   Cheri Fowler, PA-C  fludrocortisone (FLORINEF) 0.1 MG tablet Take 1 tablet (0.1 mg total) by mouth daily. 06/10/15   Chilton Si, MD  ibuprofen (ADVIL,MOTRIN) 800 MG tablet Take 1 tablet (800 mg total) by mouth 3 (three) times daily. 02/20/16   Cheri Fowler, PA-C  metFORMIN (GLUCOPHAGE) 500 MG tablet Take 500 mg by mouth 2 (two) times daily with a meal.    Historical Provider, MD  metroNIDAZOLE (FLAGYL) 500 MG tablet Take 1 tablet (500 mg total) by mouth 2 (two) times daily. X 7 days 11/05/15   Domenick Gong, MD  naproxen (NAPROSYN) 500 MG tablet Take 1 tablet (500 mg total) by mouth 2 (two) times daily with a meal. 06/22/16 06/22/17  Enid Derry, PA-C  pantoprazole (PROTONIX) 40 MG tablet Take 40 mg by mouth daily. 10/22/14   Historical Provider, MD    Allergies Patient has no known allergies.  Family History  Problem Relation Age of Onset  . Diabetes Other   . Hypertension Other   . Heart disease Other   . Cancer Other   . COPD Other   . Asthma Other   . Heart  attack Other   . Obesity Other   . Hyperlipidemia Other   . Cancer Mother     lung  . Cancer Maternal Grandmother     lung  . Cancer Father   . Heart attack Maternal Aunt   . Hypertension Maternal Aunt   . Diabetes Maternal Aunt   . Heart disease Maternal Aunt   . Cancer Maternal Grandfather     lung  . Other Neg Hx     Social History Social History  Substance Use Topics  . Smoking status: Current Every Day Smoker    Packs/day: 1.00    Years: 10.00    Types: Cigarettes  . Smokeless tobacco: Never Used  . Alcohol use No      Review of Systems  Constitutional: No fever Eyes: No visual changes. No discharge. ENT: Positive for congestion and rhinorrhea. Cardiovascular: No chest pain. Respiratory: Positive for cough. No SOB. Gastrointestinal: No abdominal pain.   No constipation. Skin: Negative for rash, abrasions, lacerations, ecchymosis. Neurological: Negative for headaches.   ____________________________________________   PHYSICAL EXAM:  VITAL SIGNS: ED Triage Vitals [06/22/16 1058]  Enc Vitals Group     BP (!) 161/91     Pulse Rate 93     Resp 18     Temp 98.8 F (37.1 C)     Temp Source Oral     SpO2 100 %     Weight 200 lb (90.7 kg)     Height      Head Circumference      Peak Flow      Pain Score 9     Pain Loc      Pain Edu?      Excl. in GC?      Constitutional: Alert and oriented. Well appearing and in no acute distress. Eyes: Conjunctivae are normal. PERRL. EOMI. No discharge. Head: Atraumatic. ENT: No frontal and maxillary sinus tenderness.      Ears: Tympanic membranes pearly gray with good landmarks. No discharge.      Nose: Mild congestion/rhinnorhea.      Mouth/Throat: Mucous membranes are moist. Oropharynx non-erythematous. Tonsils not enlarged. No exudates. Uvula midline. Neck: No stridor.   Hematological/Lymphatic/Immunilogical: No cervical lymphadenopathy. Cardiovascular: Normal rate, regular rhythm. Good peripheral circulation. Respiratory: Normal respiratory effort without tachypnea or retractions. Scattered wheezes on exam Good air entry to the bases with no decreased or absent breath sounds. Gastrointestinal: Bowel sounds 4 quadrants. Soft and nontender to palpation. No guarding or rigidity. No palpable masses. No distention. Musculoskeletal: Full range of motion to all extremities. No gross deformities appreciated. Neurologic:  Normal speech and language. No gross focal neurologic deficits are appreciated.  Skin:  Skin is warm, dry and intact. No rash  noted. Psychiatric: Mood and affect are normal. Speech and behavior are normal. Patient exhibits appropriate insight and judgement.   ____________________________________________   LABS (all labs ordered are listed, but only abnormal results are displayed)  Labs Reviewed - No data to display ____________________________________________  EKG   ____________________________________________  RADIOLOGY  No results found.  ____________________________________________    PROCEDURES  Procedure(s) performed:    Procedures    Medications  ipratropium-albuterol (DUONEB) 0.5-2.5 (3) MG/3ML nebulizer solution 3 mL (3 mLs Nebulization Given 06/22/16 1225)     ____________________________________________   INITIAL IMPRESSION / ASSESSMENT AND PLAN / ED COURSE  Pertinent labs & imaging results that were available during my care of the patient were reviewed by me and considered in my medical decision making (see  chart for details).  Review of the [place] CSRS was performed in accordance of the [facility] prior to dispensing any controlled drugs.     Patient's diagnosis is consistent with Influenza. Vital signs and exam are reassuring. Scattered wheezes heard on auscultation so DuoNeb was given. Patient states that she felt a lot better after DuoNeb and lungs were clear after treatment. Patient will be given inhaler to use at home during influenza. Patient can take naproxen for muscle aches. Patient will be discharged home with prescriptions for albuterol and naproxen. Patient is to follow up with PCP as needed or otherwise directed. Patient is given ED precautions to return to the ED for any worsening or new symptoms.     ____________________________________________  FINAL CLINICAL IMPRESSION(S) / ED DIAGNOSES  Final diagnoses:  Influenza      NEW MEDICATIONS STARTED DURING THIS VISIT:  Discharge Medication List as of 06/22/2016 12:57 PM    START taking these medications    Details  !! albuterol (PROVENTIL HFA;VENTOLIN HFA) 108 (90 Base) MCG/ACT inhaler Inhale 2 puffs into the lungs every 6 (six) hours as needed for wheezing or shortness of breath., Starting Thu 06/22/2016, Print    naproxen (NAPROSYN) 500 MG tablet Take 1 tablet (500 mg total) by mouth 2 (two) times daily with a meal., Starting Thu 06/22/2016, Until Fri 06/22/2017, Print     !! - Potential duplicate medications found. Please discuss with provider.          This chart was dictated using voice recognition software/Dragon. Despite best efforts to proofread, errors can occur which can change the meaning. Any change was purely unintentional.    Enid Derry, PA-C 06/22/16 1347    Governor Rooks, MD 06/22/16 [phone]

## 2016-06-22 NOTE — ED Notes (Signed)
Pt states generalized body aches, fever, nasal congestion and cough for 3 days, awake and alert in no acute distress

## 2016-06-22 NOTE — ED Triage Notes (Signed)
Body aches, cough and congestion X 3 days. Pt alert and oriented X4, active, cooperative, pt in NAD. RR even and unlabored, color WNL.

## 2016-08-09 ENCOUNTER — Ambulatory Visit (HOSPITAL_COMMUNITY)
Admission: EM | Admit: 2016-08-09 | Discharge: 2016-08-09 | Disposition: A | Discharge status: Home / Self Care | Payer: Medicaid Other | Attending: Family Medicine | Admitting: Family Medicine

## 2016-08-09 ENCOUNTER — Encounter (HOSPITAL_COMMUNITY): Payer: Self-pay | Admitting: Emergency Medicine

## 2016-08-09 DIAGNOSIS — R0602 Shortness of breath: Secondary | ICD-10-CM

## 2016-08-09 DIAGNOSIS — R059 Cough, unspecified: Secondary | ICD-10-CM

## 2016-08-09 DIAGNOSIS — R05 Cough: Secondary | ICD-10-CM | POA: Diagnosis not present

## 2016-08-09 DIAGNOSIS — J209 Acute bronchitis, unspecified: Secondary | ICD-10-CM | POA: Diagnosis not present

## 2016-08-09 DIAGNOSIS — J4541 Moderate persistent asthma with (acute) exacerbation: Secondary | ICD-10-CM

## 2016-08-09 DIAGNOSIS — J40 Bronchitis, not specified as acute or chronic: Secondary | ICD-10-CM

## 2016-08-09 MED ORDER — BENZONATATE 100 MG PO CAPS: 200.0000 mg | ORAL_CAPSULE | Freq: Three times a day (TID) | ORAL | 0 refills | Status: DC | PRN

## 2016-08-09 MED ORDER — ALBUTEROL SULFATE (2.5 MG/3ML) 0.083% IN NEBU
INHALATION_SOLUTION | RESPIRATORY_TRACT | Status: AC
  Filled 2016-08-09: qty 3

## 2016-08-09 MED ORDER — DEXAMETHASONE SODIUM PHOSPHATE 10 MG/ML IJ SOLN
INTRAMUSCULAR | Status: AC
  Filled 2016-08-09: qty 1

## 2016-08-09 MED ORDER — ALBUTEROL SULFATE (2.5 MG/3ML) 0.083% IN NEBU
2.5000 mg | INHALATION_SOLUTION | Freq: Once | RESPIRATORY_TRACT | Status: AC
  Administered 2016-08-09: 2.5 mg via RESPIRATORY_TRACT

## 2016-08-09 MED ORDER — DEXAMETHASONE SODIUM PHOSPHATE 10 MG/ML IJ SOLN
10.0000 mg | Freq: Once | INTRAMUSCULAR | Status: AC
  Administered 2016-08-09: 10 mg via INTRAMUSCULAR

## 2016-08-09 MED ORDER — ALBUTEROL SULFATE HFA 108 (90 BASE) MCG/ACT IN AERS: INHALATION_SPRAY | RESPIRATORY_TRACT | 1 refills | Status: DC

## 2016-08-09 MED ORDER — PREDNISONE 10 MG PO TABS: ORAL_TABLET | ORAL | 0 refills | Status: DC

## 2016-08-09 NOTE — ED Provider Notes (Signed)
CSN: [id number]     Arrival date & time 08/09/16  1116 History   None    Chief Complaint  Patient presents with  . Shortness of Breath   (Consider location/radiation/quality/duration/timing/severity/associated sxs/prior Treatment) Patient c/o sob and asthma attack after cleaning house yesterday.   The history is provided by the patient.  Shortness of Breath  Severity:  Severe Onset quality:  Sudden Duration:  1 day Timing:  Constant Progression:  Worsening Chronicity:  New Context: fumes and strong odors   Relieved by:  Nothing Worsened by:  Nothing Ineffective treatments:  None tried Associated symptoms: wheezing     Past Medical History:  Diagnosis Date  . ADHD (attention deficit hyperactivity disorder)   . Anxiety   . Asthma    Uses inhaler prn  . Bradycardia 02/23/2015  . Depression   . Dizziness 06/10/2015  . Gestational diabetes    glyburide  . Shingles    Past Surgical History:  Procedure Laterality Date  . APPENDECTOMY    . FOOT SURGERY    . TUBAL LIGATION Bilateral 11/27/2014   Procedure: POST PARTUM TUBAL LIGATION;  Surgeon: Lavina Hamman, MD;  Location: [facility];  Service: Gynecology;  Laterality: Bilateral;   Family History  Problem Relation Age of Onset  . Diabetes Other   . Hypertension Other   . Heart disease Other   . Cancer Other   . COPD Other   . Asthma Other   . Heart attack Other   . Obesity Other   . Hyperlipidemia Other   . Cancer Mother     lung  . Cancer Maternal Grandmother     lung  . Cancer Father   . Heart attack Maternal Aunt   . Hypertension Maternal Aunt   . Diabetes Maternal Aunt   . Heart disease Maternal Aunt   . Cancer Maternal Grandfather     lung  . Other Neg Hx    Social History  Substance Use Topics  . Smoking status: Current Every Day Smoker    Packs/day: 1.00    Years: 10.00    Types: Cigarettes  . Smokeless tobacco: Never Used  . Alcohol use No   OB History    Gravida Para Term Preterm AB Living   3 3 3  0 0 3   SAB TAB Ectopic Multiple Live Births   0 0 0 0 3     Review of Systems  Constitutional: Negative.   HENT: Negative.   Eyes: Negative.   Respiratory: Positive for shortness of breath and wheezing.   Cardiovascular: Negative.   Gastrointestinal: Negative.   Endocrine: Negative.   Genitourinary: Negative.   Musculoskeletal: Negative.   Allergic/Immunologic: Negative.   Neurological: Negative.   Hematological: Negative.   Psychiatric/Behavioral: Negative.     Allergies  Patient has no known allergies.  Home Medications   Prior to Admission medications   Medication Sig Start Date End Date Taking? Authorizing Provider  ALPRAZolam Prudy Feeler) 1 MG tablet Take 1 mg by mouth at bedtime as needed for anxiety.   Yes Historical Provider, MD  albuterol (PROVENTIL HFA;VENTOLIN HFA) 108 (90 Base) MCG/ACT inhaler Inhale 2 puffs into the lungs every 6 (six) hours as needed for wheezing or shortness of breath. Patient not taking: Reported on 08/09/2016 06/22/16   Enid Derry, PA-C  albuterol (PROVENTIL HFA;VENTOLIN HFA) 108 (90 Base) MCG/ACT inhaler 1-2 puffs every 6 hours while awake for the next 36 hours then prn cough/wheeze/sob 08/09/16   Deatra Canter, FNP  benzonatate (  TESSALON) 100 MG capsule Take 2 capsules (200 mg total) by mouth 3 (three) times daily as needed for cough. 08/09/16   Deatra CanterWilliam J Jasamine Pottinger, FNP  cyclobenzaprine (FLEXERIL) 10 MG tablet Take 1 tablet (10 mg total) by mouth 2 (two) times daily as needed for muscle spasms. 02/20/16   Cheri FowlerKayla Rose, PA-C  fludrocortisone (FLORINEF) 0.1 MG tablet Take 1 tablet (0.1 mg total) by mouth daily. 06/10/15   Chilton Siiffany Edgar, MD  ibuprofen (ADVIL,MOTRIN) 800 MG tablet Take 1 tablet (800 mg total) by mouth 3 (three) times daily. 02/20/16   Cheri FowlerKayla Rose, PA-C  metFORMIN (GLUCOPHAGE) 500 MG tablet Take 500 mg by mouth 2 (two) times daily with a meal.    Historical Provider, MD  metroNIDAZOLE (FLAGYL) 500 MG tablet Take 1 tablet (500 mg total)  by mouth 2 (two) times daily. X 7 days 11/05/15   Domenick GongAshley Mortenson, MD  naproxen (NAPROSYN) 500 MG tablet Take 1 tablet (500 mg total) by mouth 2 (two) times daily with a meal. 06/22/16 06/22/17  Enid DerryAshley Wagner, PA-C  pantoprazole (PROTONIX) 40 MG tablet Take 40 mg by mouth daily. 10/22/14   Historical Provider, MD  predniSONE (DELTASONE) 10 MG tablet Take 4 po qd x 2days then 3po qd x 3days then 2po qd x 2days then 1 po qd x 2days then stop 08/09/16   Deatra CanterWilliam J Saydi Kobel, FNP   Meds Ordered and Administered this Visit   Medications  albuterol (PROVENTIL) (2.5 MG/3ML) 0.083% nebulizer solution 2.5 mg (2.5 mg Nebulization Given 08/09/16 1134)  dexamethasone (DECADRON) injection 10 mg (10 mg Intramuscular Given 08/09/16 1134)    BP 105/70 (BP Location: Right Arm)   Pulse 83   Temp 98.9 F (37.2 C) (Oral)   Resp (!) 28 Comment: frequent cough  LMP 08/07/2016   SpO2 100%  No data found.   Physical Exam  Constitutional: She appears well-developed and well-nourished.  HENT:  Head: Normocephalic and atraumatic.  Right Ear: External ear normal.  Left Ear: External ear normal.  Mouth/Throat: Oropharynx is clear and moist.  Eyes: Conjunctivae and EOM are normal. Pupils are equal, round, and reactive to light.  Neck: Normal range of motion. Neck supple.  Cardiovascular: Normal rate, regular rhythm and normal heart sounds.   Pulmonary/Chest: Effort normal. She has wheezes.  Nursing note and vitals reviewed.   Urgent Care Course     Procedures (including critical care time)  Labs Review Labs Reviewed - No data to display  Imaging Review No results found.   Visual Acuity Review  Right Eye Distance:   Left Eye Distance:   Bilateral Distance:    Right Eye Near:   Left Eye Near:    Bilateral Near:         MDM   1. Moderate persistent asthma with exacerbation   2. Cough   3. Bronchitis    Albuterol Nebulizer Decadron 10mg  Prednisone 10mg  4 po qd x 2d then 3 po qd x 2d then 2po  qd x 2d then 1po qd x 2d then stop #20 Albuterol MDI 2 puffs qid prn   Push po fluids, rest, tylenol and motrin otc prn as directed for fever, arthralgias, and myalgias.  Follow up prn if sx's continue or persist.    Deatra CanterWilliam J Ivyana Locey, FNP 08/09/16 1211    Deatra CanterWilliam J Jamarri Vuncannon, FNP 08/09/16 1213

## 2016-08-09 NOTE — ED Triage Notes (Signed)
Patient denies cold symptoms recently, but did do heavy cleaning in her home yesterday.  Patient feels tight in chest.  Patient reports feeling a lot of congestion.

## 2016-08-14 ENCOUNTER — Emergency Department: Payer: Medicaid Other

## 2016-08-14 ENCOUNTER — Emergency Department
Admission: EM | Admit: 2016-08-14 | Discharge: 2016-08-14 | Disposition: A | Discharge status: Home / Self Care | Payer: Medicaid Other | Attending: Emergency Medicine | Admitting: Emergency Medicine

## 2016-08-14 ENCOUNTER — Encounter: Payer: Self-pay | Admitting: *Deleted

## 2016-08-14 DIAGNOSIS — J4541 Moderate persistent asthma with (acute) exacerbation: Secondary | ICD-10-CM | POA: Insufficient documentation

## 2016-08-14 DIAGNOSIS — F17210 Nicotine dependence, cigarettes, uncomplicated: Secondary | ICD-10-CM | POA: Diagnosis not present

## 2016-08-14 DIAGNOSIS — Z79899 Other long term (current) drug therapy: Secondary | ICD-10-CM | POA: Insufficient documentation

## 2016-08-14 DIAGNOSIS — F909 Attention-deficit hyperactivity disorder, unspecified type: Secondary | ICD-10-CM | POA: Insufficient documentation

## 2016-08-14 DIAGNOSIS — J069 Acute upper respiratory infection, unspecified: Secondary | ICD-10-CM | POA: Diagnosis not present

## 2016-08-14 DIAGNOSIS — R05 Cough: Secondary | ICD-10-CM | POA: Diagnosis present

## 2016-08-14 MED ORDER — IPRATROPIUM-ALBUTEROL 0.5-2.5 (3) MG/3ML IN SOLN
3.0000 mL | Freq: Once | RESPIRATORY_TRACT | Status: AC
  Administered 2016-08-14: 3 mL via RESPIRATORY_TRACT

## 2016-08-14 MED ORDER — IPRATROPIUM-ALBUTEROL 0.5-2.5 (3) MG/3ML IN SOLN
RESPIRATORY_TRACT | Status: AC
  Administered 2016-08-14: 3 mL via RESPIRATORY_TRACT
  Filled 2016-08-14: qty 3

## 2016-08-14 MED ORDER — AZITHROMYCIN 250 MG PO TABS
500.0000 mg | ORAL_TABLET | Freq: Once | ORAL | Status: AC
  Administered 2016-08-14: 500 mg via ORAL
  Filled 2016-08-14: qty 2

## 2016-08-14 MED ORDER — AZITHROMYCIN 250 MG PO TABS: ORAL_TABLET | ORAL | 0 refills | Status: AC

## 2016-08-14 MED ORDER — DEXAMETHASONE SODIUM PHOSPHATE 10 MG/ML IJ SOLN
10.0000 mg | Freq: Once | INTRAMUSCULAR | Status: AC
  Administered 2016-08-14: 10 mg via INTRAMUSCULAR

## 2016-08-14 MED ORDER — IPRATROPIUM-ALBUTEROL 0.5-2.5 (3) MG/3ML IN SOLN
3.0000 mL | Freq: Once | RESPIRATORY_TRACT | Status: AC
  Administered 2016-08-14: 3 mL via RESPIRATORY_TRACT
  Filled 2016-08-14: qty 3

## 2016-08-14 MED ORDER — DEXAMETHASONE SODIUM PHOSPHATE 10 MG/ML IJ SOLN
INTRAMUSCULAR | Status: AC
  Administered 2016-08-14: 10 mg via INTRAMUSCULAR
  Filled 2016-08-14: qty 1

## 2016-08-14 NOTE — ED Provider Notes (Signed)
[facility] Emergency Department Provider Note   ____________________________________________    I have reviewed the triage vital signs and the nursing notes.   HISTORY  Chief Complaint Cough shortness of breath    HPI Jamie Palmer is a 31 y.o. female Who presents with complaints of cough. Patient reports she has had a cough for nearly 1 week. She was seen in urgent care3 days ago because her asthma was flaring up, she was treated with an inhaler and cough medication but reports she feels short of breath and continues to have a cough. She denies calf pain or swelling   Past Medical History:  Diagnosis Date  . ADHD (attention deficit hyperactivity disorder)   . Anxiety   . Asthma    Uses inhaler prn  . Bradycardia 02/23/2015  . Depression   . Dizziness 06/10/2015  . Gestational diabetes    glyburide  . Shingles     Patient Active Problem List   Diagnosis Date Noted  . Dizziness 06/10/2015  . Bradycardia 02/23/2015  . Diabetes mellitus during pregnancy in third trimester 11/26/2014  . Gestational diabetes 03/29/2012  . SVD (spontaneous vaginal delivery) 03/29/2012    Past Surgical History:  Procedure Laterality Date  . APPENDECTOMY    . FOOT SURGERY    . TUBAL LIGATION Bilateral 11/27/2014   Procedure: POST PARTUM TUBAL LIGATION;  Surgeon: Lavina Hamman, MD;  Location: [facility];  Service: Gynecology;  Laterality: Bilateral;    Prior to Admission medications   Medication Sig Start Date End Date Taking? Authorizing Provider  albuterol (PROVENTIL HFA;VENTOLIN HFA) 108 (90 Base) MCG/ACT inhaler Inhale 2 puffs into the lungs every 6 (six) hours as needed for wheezing or shortness of breath. Patient not taking: Reported on 08/09/2016 06/22/16   Enid Derry, PA-C  albuterol (PROVENTIL HFA;VENTOLIN HFA) 108 (90 Base) MCG/ACT inhaler 1-2 puffs every 6 hours while awake for the next 36 hours then prn cough/wheeze/sob 08/09/16   Deatra Canter,  FNP  ALPRAZolam Prudy Feeler) 1 MG tablet Take 1 mg by mouth at bedtime as needed for anxiety.    Historical Provider, MD  azithromycin (ZITHROMAX Z-PAK) 250 MG tablet Take 2 tablets (500 mg) on  Day 1,  followed by 1 tablet (250 mg) once daily on Days 2 through 5. 08/14/16 08/19/16  Jene Every, MD  benzonatate (TESSALON) 100 MG capsule Take 2 capsules (200 mg total) by mouth 3 (three) times daily as needed for cough. 08/09/16   Deatra Canter, FNP  cyclobenzaprine (FLEXERIL) 10 MG tablet Take 1 tablet (10 mg total) by mouth 2 (two) times daily as needed for muscle spasms. 02/20/16   Cheri Fowler, PA-C  fludrocortisone (FLORINEF) 0.1 MG tablet Take 1 tablet (0.1 mg total) by mouth daily. 06/10/15   Chilton Si, MD  ibuprofen (ADVIL,MOTRIN) 800 MG tablet Take 1 tablet (800 mg total) by mouth 3 (three) times daily. 02/20/16   Cheri Fowler, PA-C  metFORMIN (GLUCOPHAGE) 500 MG tablet Take 500 mg by mouth 2 (two) times daily with a meal.    Historical Provider, MD  metroNIDAZOLE (FLAGYL) 500 MG tablet Take 1 tablet (500 mg total) by mouth 2 (two) times daily. X 7 days 11/05/15   Domenick Gong, MD  naproxen (NAPROSYN) 500 MG tablet Take 1 tablet (500 mg total) by mouth 2 (two) times daily with a meal. 06/22/16 06/22/17  Enid Derry, PA-C  pantoprazole (PROTONIX) 40 MG tablet Take 40 mg by mouth daily. 10/22/14   Historical Provider,  MD  predniSONE (DELTASONE) 10 MG tablet Take 4 po qd x 2days then 3po qd x 3days then 2po qd x 2days then 1 po qd x 2days then stop 08/09/16   Deatra Canter, FNP     Allergies Patient has no known allergies.  Family History  Problem Relation Age of Onset  . Diabetes Other   . Hypertension Other   . Heart disease Other   . Cancer Other   . COPD Other   . Asthma Other   . Heart attack Other   . Obesity Other   . Hyperlipidemia Other   . Cancer Mother     lung  . Cancer Maternal Grandmother     lung  . Cancer Father   . Heart attack Maternal Aunt   . Hypertension  Maternal Aunt   . Diabetes Maternal Aunt   . Heart disease Maternal Aunt   . Cancer Maternal Grandfather     lung  . Other Neg Hx     Social History Social History  Substance Use Topics  . Smoking status: Current Every Day Smoker    Packs/day: 1.00    Years: 10.00    Types: Cigarettes  . Smokeless tobacco: Never Used  . Alcohol use No    Review of Systems  Constitutional: No fever/chills  ENT: No sore throat. Cardiovascular: mild chest tightness Respiratory: as above Gastrointestinal: No abdominal pain.  No nausea, no vomiting.   Genitourinary: Negative for dysuria. Musculoskeletal: Negative for back pain. Skin: Negative for rash. Neurological: Negative for headaches   10-point ROS otherwise negative.  ____________________________________________   PHYSICAL EXAM:  VITAL SIGNS: ED Triage Vitals  Enc Vitals Group     BP 08/14/16 2048 (!) 148/87     Pulse Rate 08/14/16 2048 89     Resp 08/14/16 2048 20     Temp 08/14/16 2048 99.3 F (37.4 C)     Temp Source 08/14/16 2048 Oral     SpO2 08/14/16 2048 99 %     Weight 08/14/16 2049 200 lb (90.7 kg)     Height 08/14/16 2049  (1.676 m)     Head Circumference --      Peak Flow --      Pain Score 08/14/16 2052 9     Pain Loc --      Pain Edu? --      Excl. in GC? --     Constitutional: Alert and oriented. No acute distress. Pleasant and interactive Eyes: Conjunctivae are normal.   Nose: No congestion/rhinnorhea. Mouth/Throat: Mucous membranes are moist.    Cardiovascular: Normal rate, regular rhythm. Grossly normal heart sounds.  Good peripheral circulation. Respiratory: Normal respiratory effort.  No retractions.diffuse wheezing Gastrointestinal: Soft and nontender. No distention.  No CVA tenderness. Genitourinary: deferred Musculoskeletal: No lower extremity tenderness nor edema.  Warm and well perfused Neurologic:  Normal speech and language. No gross focal neurologic deficits are appreciated.    Skin:  Skin is warm, dry and intact. No rash noted. Psychiatric: Mood and affect are normal. Speech and behavior are normal.  ____________________________________________   LABS (all labs ordered are listed, but only abnormal results are displayed)  Labs Reviewed - No data to display ____________________________________________  EKG  None ____________________________________________  RADIOLOGY  Chest x-ray unremarkable ____________________________________________   PROCEDURES  Procedure(s) performed: No    Critical Care performed: No ____________________________________________   INITIAL IMPRESSION / ASSESSMENT AND PLAN / ED COURSE  Pertinent labs & imaging results that were available  during my care of the patient were reviewed by me and considered in my medical decision making (see chart for details).  Patient presented with cough and wheezing. We'll give DuoNeb, Decadron, check chest x-ray and reevaluate   ----------------------------------------- 10:33 PM on 08/14/2016 -----------------------------------------  Patient's wheezing is greatly improved. She is anxious to leave and I think this is appropriate time for discharge. She has an inhaler at home has PCP follow-up. She is to return if any worsening of her breathing  I spent 3 minutes counseling smoking cessation    ____________________________________________   FINAL CLINICAL IMPRESSION(S) / ED DIAGNOSES  Final diagnoses:  Viral upper respiratory tract infection  Moderate persistent asthma with exacerbation      NEW MEDICATIONS STARTED DURING THIS VISIT:  New Prescriptions   AZITHROMYCIN (ZITHROMAX Z-PAK) 250 MG TABLET    Take 2 tablets (500 mg) on  Day 1,  followed by 1 tablet (250 mg) once daily on Days 2 through 5.     Note:  This document was prepared using Dragon voice recognition software and may include unintentional dictation errors.    Jene Every, MD 08/14/16 2234

## 2016-08-14 NOTE — ED Notes (Signed)
Patient transported to X-ray 

## 2016-08-14 NOTE — ED Triage Notes (Signed)
Pt has a cough and congestion.  Treated recently for bronchitis.  Pt continues to have a cough.  Pt alert.

## 2016-09-12 ENCOUNTER — Encounter (HOSPITAL_COMMUNITY): Payer: Self-pay | Admitting: Family Medicine

## 2016-09-12 ENCOUNTER — Ambulatory Visit (HOSPITAL_COMMUNITY)
Admission: EM | Admit: 2016-09-12 | Discharge: 2016-09-12 | Disposition: A | Discharge status: Home / Self Care | Payer: Medicaid Other | Attending: Family Medicine | Admitting: Family Medicine

## 2016-09-12 DIAGNOSIS — K0889 Other specified disorders of teeth and supporting structures: Secondary | ICD-10-CM

## 2016-09-12 DIAGNOSIS — B370 Candidal stomatitis: Secondary | ICD-10-CM

## 2016-09-12 DIAGNOSIS — K047 Periapical abscess without sinus: Secondary | ICD-10-CM | POA: Diagnosis not present

## 2016-09-12 MED ORDER — KETOROLAC TROMETHAMINE 30 MG/ML IJ SOLN
INTRAMUSCULAR | Status: AC
  Filled 2016-09-12: qty 1

## 2016-09-12 MED ORDER — FLUCONAZOLE 150 MG PO TABS: 150.0000 mg | ORAL_TABLET | Freq: Every day | ORAL | 1 refills | Status: DC

## 2016-09-12 MED ORDER — AMOXICILLIN 875 MG PO TABS: 875.0000 mg | ORAL_TABLET | Freq: Two times a day (BID) | ORAL | 0 refills | Status: DC

## 2016-09-12 MED ORDER — KETOROLAC TROMETHAMINE 30 MG/ML IJ SOLN
30.0000 mg | Freq: Once | INTRAMUSCULAR | Status: AC
  Administered 2016-09-12: 30 mg via INTRAMUSCULAR

## 2016-09-12 MED ORDER — IBUPROFEN 800 MG PO TABS
800.0000 mg | ORAL_TABLET | Freq: Three times a day (TID) | ORAL | 0 refills | Status: DC
Start: 2016-09-12 — End: 2017-01-26

## 2016-09-12 NOTE — ED Triage Notes (Signed)
Pt here for oral swelling and pain due to tooth that was pulled. sts she can taste the infection in her mouth.

## 2016-09-12 NOTE — ED Provider Notes (Signed)
CSN: [id number]     Arrival date & time 09/12/16  1902 History   First MD Initiated Contact with Patient 09/12/16 1929     Chief Complaint  Patient presents with  . Oral Swelling  . Dental Pain   (Consider location/radiation/quality/duration/timing/severity/associated sxs/prior Treatment) Patient c/o having recent right upper molar extracted and she has been "tasting the infection"  She has pain and discomfort.   The history is provided by the patient.  Dental Pain  Location:  Upper Upper teeth location:  4/RU 2nd bicuspid Quality:  Aching Severity:  Moderate Context: recent dental surgery   Previous work-up:  Dental exam Relieved by:  Nothing Worsened by:  Nothing   Past Medical History:  Diagnosis Date  . ADHD (attention deficit hyperactivity disorder)   . Anxiety   . Asthma    Uses inhaler prn  . Bradycardia 02/23/2015  . Depression   . Dizziness 06/10/2015  . Gestational diabetes    glyburide  . Shingles    Past Surgical History:  Procedure Laterality Date  . APPENDECTOMY    . FOOT SURGERY    . TUBAL LIGATION Bilateral 11/27/2014   Procedure: POST PARTUM TUBAL LIGATION;  Surgeon: Lavina Hamman, MD;  Location: [facility];  Service: Gynecology;  Laterality: Bilateral;   Family History  Problem Relation Age of Onset  . Diabetes Other   . Hypertension Other   . Heart disease Other   . Cancer Other   . COPD Other   . Asthma Other   . Heart attack Other   . Obesity Other   . Hyperlipidemia Other   . Cancer Mother     lung  . Cancer Maternal Grandmother     lung  . Cancer Father   . Heart attack Maternal Aunt   . Hypertension Maternal Aunt   . Diabetes Maternal Aunt   . Heart disease Maternal Aunt   . Cancer Maternal Grandfather     lung  . Other Neg Hx    Social History  Substance Use Topics  . Smoking status: Current Every Day Smoker    Packs/day: 1.00    Years: 10.00    Types: Cigarettes  . Smokeless tobacco: Never Used  . Alcohol use No   OB  History    Gravida Para Term Preterm AB Living   0 0 3   SAB TAB Ectopic Multiple Live Births   0 0 0 0 3     Review of Systems  Constitutional: Negative.   HENT: Negative.   Eyes: Negative.   Respiratory: Negative.   Cardiovascular: Negative.   Gastrointestinal: Negative.   Endocrine: Negative.   Genitourinary: Negative.   Musculoskeletal: Negative.   Allergic/Immunologic: Negative.   Neurological: Negative.   Hematological: Negative.   Psychiatric/Behavioral: Negative.     Allergies  Patient has no known allergies.  Home Medications   Prior to Admission medications   Medication Sig Start Date End Date Taking? Authorizing Provider  albuterol (PROVENTIL HFA;VENTOLIN HFA) 108 (90 Base) MCG/ACT inhaler Inhale 2 puffs into the lungs every 6 (six) hours as needed for wheezing or shortness of breath. Patient not taking: Reported on 08/09/2016 06/22/16   Enid Derry, PA-C  albuterol (PROVENTIL HFA;VENTOLIN HFA) 108 (90 Base) MCG/ACT inhaler 1-2 puffs every 6 hours while awake for the next 36 hours then prn cough/wheeze/sob 08/09/16   Deatra Canter, FNP  ALPRAZolam Prudy Feeler) 1 MG tablet Take 1 mg by mouth at bedtime as needed for anxiety.  Historical Provider, MD  amoxicillin (AMOXIL) 875 MG tablet Take 1 tablet (875 mg total) by mouth 2 (two) times daily. 09/12/16   Deatra Canter, FNP  fluconazole (DIFLUCAN) 150 MG tablet Take 1 tablet (150 mg total) by mouth daily. 09/12/16   Deatra Canter, FNP  fludrocortisone (FLORINEF) 0.1 MG tablet Take 1 tablet (0.1 mg total) by mouth daily. 06/10/15   Chilton Si, MD  ibuprofen (ADVIL,MOTRIN) 800 MG tablet Take 1 tablet (800 mg total) by mouth 3 (three) times daily. 02/20/16   Cheri Fowler, PA-C  ibuprofen (ADVIL,MOTRIN) 800 MG tablet Take 1 tablet (800 mg total) by mouth 3 (three) times daily. 09/12/16   Deatra Canter, FNP  metFORMIN (GLUCOPHAGE) 500 MG tablet Take 500 mg by mouth 2 (two) times daily with a meal.    Historical  Provider, MD   Meds Ordered and Administered this Visit   Medications  ketorolac (TORADOL) 30 MG/ML injection 30 mg (not administered)    BP 127/90 (BP Location: Left Arm)   Pulse 73   Temp 98.8 F (37.1 C) (Oral)   Resp 16   SpO2 100%  No data found.   Physical Exam  Constitutional: She appears well-developed and well-nourished.  HENT:  Head: Normocephalic and atraumatic.  Right Ear: External ear normal.  Left Ear: External ear normal.  Nose: Nose normal.  Right upper gum appears wnl and no erythema.  Tongue with thrush  Eyes: Conjunctivae are normal. Pupils are equal, round, and reactive to light.  Neck: Normal range of motion. Neck supple.  Cardiovascular: Normal rate, regular rhythm and normal heart sounds.   Pulmonary/Chest: Effort normal and breath sounds normal.  Abdominal: Soft. Bowel sounds are normal.  Nursing note and vitals reviewed.   Urgent Care Course     Procedures (including critical care time)  Labs Review Labs Reviewed - No data to display  Imaging Review No results found.   Visual Acuity Review  Right Eye Distance:   Left Eye Distance:   Bilateral Distance:    Right Eye Near:   Left Eye Near:    Bilateral Near:         MDM   1. Pain, dental   2. Dental infection   3. Thrush    Diflucan  one po qd #2 w/1rf Amoxicillin  one po bid x 10 days 320 Motrin  one po tid prn pain #30  Toradol 30 mg IM      Deatra Canter, FNP 09/12/16 1940

## 2016-09-12 NOTE — Discharge Instructions (Signed)
Follow up with Dentist

## 2017-01-26 ENCOUNTER — Emergency Department
Admission: EM | Admit: 2017-01-26 | Discharge: 2017-01-26 | Disposition: A | Discharge status: Home / Self Care | Payer: Medicaid Other | Attending: Emergency Medicine | Admitting: Emergency Medicine

## 2017-01-26 ENCOUNTER — Telehealth: Payer: Self-pay | Admitting: Emergency Medicine

## 2017-01-26 ENCOUNTER — Encounter: Payer: Self-pay | Admitting: Emergency Medicine

## 2017-01-26 DIAGNOSIS — N730 Acute parametritis and pelvic cellulitis: Secondary | ICD-10-CM | POA: Diagnosis not present

## 2017-01-26 DIAGNOSIS — F17210 Nicotine dependence, cigarettes, uncomplicated: Secondary | ICD-10-CM | POA: Diagnosis not present

## 2017-01-26 DIAGNOSIS — J45909 Unspecified asthma, uncomplicated: Secondary | ICD-10-CM | POA: Insufficient documentation

## 2017-01-26 DIAGNOSIS — R102 Pelvic and perineal pain: Secondary | ICD-10-CM | POA: Diagnosis present

## 2017-01-26 LAB — WET PREP, GENITAL
Sperm: NONE SEEN
TRICH WET PREP: NONE SEEN
Yeast Wet Prep HPF POC: NONE SEEN

## 2017-01-26 LAB — CHLAMYDIA/NGC RT PCR (ARMC ONLY)
CHLAMYDIA TR: NOT DETECTED
N gonorrhoeae: NOT DETECTED

## 2017-01-26 MED ORDER — AZITHROMYCIN 500 MG PO TABS
1000.0000 mg | ORAL_TABLET | Freq: Once | ORAL | Status: AC
  Administered 2017-01-26: 1000 mg via ORAL
  Filled 2017-01-26: qty 2

## 2017-01-26 MED ORDER — DOXYCYCLINE HYCLATE 100 MG PO CAPS: 100.0000 mg | ORAL_CAPSULE | Freq: Two times a day (BID) | ORAL | 0 refills | Status: DC

## 2017-01-26 MED ORDER — IBUPROFEN 800 MG PO TABS: 800.0000 mg | ORAL_TABLET | Freq: Three times a day (TID) | ORAL | 0 refills | Status: DC | PRN

## 2017-01-26 MED ORDER — CEFTRIAXONE SODIUM 250 MG IJ SOLR
250.0000 mg | INTRAMUSCULAR | Status: DC
  Administered 2017-01-26: 250 mg via INTRAMUSCULAR
  Filled 2017-01-26: qty 250

## 2017-01-26 MED ORDER — OXYCODONE-ACETAMINOPHEN 5-325 MG PO TABS
2.0000 | ORAL_TABLET | Freq: Once | ORAL | Status: AC
  Administered 2017-01-26: 2 via ORAL
  Filled 2017-01-26: qty 2

## 2017-01-26 MED ORDER — METRONIDAZOLE 500 MG PO TABS: 500.0000 mg | ORAL_TABLET | Freq: Two times a day (BID) | ORAL | 0 refills | Status: DC

## 2017-01-26 NOTE — Telephone Encounter (Signed)
Patient called asking for test results. I gave her results for gc/chlamydia and instructed her to continue her antibiotics.  She asked about resuming sexual intercourse, and I advised to see her pcp in follow up first.

## 2017-01-26 NOTE — ED Triage Notes (Signed)
Patient ambulatory to triage with steady gait, without difficulty or distress noted; pt reports lower abd pain with spotting

## 2017-01-26 NOTE — ED Provider Notes (Signed)
[facility] Emergency Department Provider Note       Time seen: ----------------------------------------- 7:15 AM on 01/26/2017 -----------------------------------------     I have reviewed the triage vital signs and the nursing notes.   HISTORY   Chief Complaint Abdominal Pain    HPI Jamie Palmer is a 31 y.o. female who presents to the ED for pelvic pain and intermittent vaginal bleeding. Patient reports she ended her menstrual cycle almost a week ago but she still had intermittent spotting. She does have lower abdominal pain and she is concerned because the person she is sexually active with his promiscuous. Pain is 8 out of 10, nothing makes it better or worse.   Past Medical History:  Diagnosis Date  . ADHD (attention deficit hyperactivity disorder)   . Anxiety   . Asthma    Uses inhaler prn  . Bradycardia 02/23/2015  . Depression   . Dizziness 06/10/2015  . Gestational diabetes    glyburide  . Shingles     Patient Active Problem List   Diagnosis Date Noted  . Dizziness 06/10/2015  . Bradycardia 02/23/2015  . Diabetes mellitus during pregnancy in third trimester 11/26/2014  . Gestational diabetes 03/29/2012  . SVD (spontaneous vaginal delivery) 03/29/2012    Past Surgical History:  Procedure Laterality Date  . APPENDECTOMY    . FOOT SURGERY    . TUBAL LIGATION Bilateral 11/27/2014   Procedure: POST PARTUM TUBAL LIGATION;  Surgeon: Lavina Hamman, MD;  Location: [facility];  Service: Gynecology;  Laterality: Bilateral;    Allergies Patient has no known allergies.  Social History Social History  Substance Use Topics  . Smoking status: Current Every Day Smoker    Packs/day: 1.00    Years: 10.00    Types: Cigarettes  . Smokeless tobacco: Never Used  . Alcohol use No    Review of Systems Constitutional: Negative for fever. Cardiovascular: Negative for chest pain. Respiratory: Negative for shortness of  breath. Gastrointestinal: Positive for abdominal pain Genitourinary: Positive for pelvic pain, vaginal bleeding Musculoskeletal: Negative for back pain. Skin: Negative for rash. Neurological: Negative for headaches, focal weakness or numbness.  All systems negative/normal/unremarkable except as stated in the HPI  ____________________________________________   PHYSICAL EXAM:  VITAL SIGNS: ED Triage Vitals  Enc Vitals Group     BP 01/26/17 0654 (!) 119/92     Pulse Rate 01/26/17 0654 76     Resp 01/26/17 0654 18     Temp 01/26/17 0654 98.5 F (36.9 C)     Temp Source 01/26/17 0654 Oral     SpO2 01/26/17 0654 99 %     Weight 01/26/17 0649 200 lb (90.7 kg)     Height 01/26/17 0649  (1.676 m)     Head Circumference --      Peak Flow --      Pain Score 01/26/17 0649 8     Pain Loc --      Pain Edu? --      Excl. in GC? --     Constitutional: Alert and oriented. Well appearing and in no distress. Eyes: Conjunctivae are normal. Normal extraocular movements. Cardiovascular: Normal rate, regular rhythm. No murmurs, rubs, or gallops. Respiratory: Normal respiratory effort without tachypnea nor retractions. Breath sounds are clear and equal bilaterally. No wheezes/rales/rhonchi. Gastrointestinal: Pelvic tenderness, no rebound or guarding. Normal bowel sounds. Genitourinary: Yellow vaginal discharge, cervical motion tenderness Musculoskeletal: Nontender with normal range of motion in extremities. No lower extremity tenderness nor edema. Neurologic:  Normal speech and language. No gross focal neurologic deficits are appreciated.  Skin:  Skin is warm, dry and intact. No rash noted. Psychiatric: Mood and affect are normal. Speech and behavior are normal.  ____________________________________________  ED COURSE:  Pertinent labs & imaging results that were available during my care of the patient were reviewed by me and considered in my medical decision making (see chart for  details). Patient presents for pelvic pain and abnormal vaginal bleeding, we will assess with labs and imaging as indicated.   Procedures ____________________________________________   LABS (pertinent positives/negatives)  Labs Reviewed  WET PREP, GENITAL  CHLAMYDIA/NGC RT PCR ([facility] ONLY)   ____________________________________________  FINAL ASSESSMENT AND PLAN  Pelvic inflammatory disease   Plan: Patient's labs and imaging were dictated above. Patient had presented for pelvic pain with yellow vaginal discharge and tenderness on examination. Her signs and symptoms are consistent with PID. She was given Rocephin and Zithromax here. She will be discharged with doxycycline and Flagyl. She is stable for outpatient follow-up.   Emily Filbert, MD   Note: This note was generated in part or whole with voice recognition software. Voice recognition is usually quite accurate but there are transcription errors that can and very often do occur. I apologize for any typographical errors that were not detected and corrected.     Emily Filbert, MD 01/26/17 [phone]

## 2017-02-21 ENCOUNTER — Ambulatory Visit: Payer: Medicaid Other | Admitting: Physician Assistant

## 2017-02-21 ENCOUNTER — Encounter: Payer: Self-pay | Admitting: *Deleted

## 2017-02-21 NOTE — Progress Notes (Deleted)
Cardiology Office Note   Date:  02/21/2017   ID:  Jamie, Palmer [birth year], MRN [id number]  PCP:  Marletta Lor, NP  Cardiologist:  Dr. Duke Salvia, 06/09/2015 Oriel Rumbold, Bjorn Loser, PA-C   No chief complaint on file.   History of Present Illness: Jamie Palmer is a 31 y.o. 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 ADHD (attention deficit hyperactivity disorder)   . Anxiety   . Asthma    Uses inhaler prn  . Bradycardia 02/23/2015  . Depression   . Dizziness 06/10/2015  . Gestational diabetes    glyburide  . Shingles     Past Surgical History:  Procedure Laterality Date  . APPENDECTOMY    . FOOT SURGERY    . TUBAL LIGATION Bilateral 11/27/2014   Procedure: POST PARTUM TUBAL LIGATION;  Surgeon: Lavina Hamman, MD;  Location: [facility];  Service: Gynecology;  Laterality: Bilateral;    Current Outpatient Prescriptions  Medication Sig Dispense Refill  . albuterol (PROVENTIL HFA;VENTOLIN HFA) 108 (90 Base) MCG/ACT inhaler Inhale 2 puffs into the lungs every 6 (six) hours as needed for wheezing or shortness of breath. (Patient not taking: Reported on 08/09/2016) 1 Inhaler 0  . albuterol (PROVENTIL HFA;VENTOLIN HFA) 108 (90 Base) MCG/ACT inhaler 1-2 puffs every 6 hours while awake for the next 36 hours then prn cough/wheeze/sob 1 Inhaler 1  . amoxicillin (AMOXIL) 875 MG tablet Take 1 tablet (875 mg total) by mouth 2 (two) times daily. (Patient not taking: Reported on 01/26/2017) 20 tablet 0  . doxycycline (VIBRAMYCIN) 100 MG capsule Take 1 capsule (100 mg total) by mouth 2 (two) times daily. 14 capsule 0  . fluconazole (DIFLUCAN) 150 MG tablet Take 1 tablet (150 mg total) by mouth daily. (Patient not taking: Reported on 01/26/2017) 2 tablet 1  . fludrocortisone (FLORINEF) 0.1 MG tablet Take 1 tablet  (0.1 mg total) by mouth daily. (Patient not taking: Reported on 01/26/2017) 30 tablet 6  . ibuprofen (ADVIL,MOTRIN) 800 MG tablet Take 1 tablet (800 mg total) by mouth 3 (three) times daily. (Patient not taking: Reported on 01/26/2017) 21 tablet 0  . ibuprofen (ADVIL,MOTRIN) 800 MG tablet Take 1 tablet (800 mg total) by mouth every 8 (eight) hours as needed. 30 tablet 0  . metroNIDAZOLE (FLAGYL) 500 MG tablet Take 1 tablet (500 mg total) by mouth 2 (two) times daily. 14 tablet 0   No current facility-administered medications for this visit.     Allergies:   Patient has no known allergies.    Social History:  The patient  reports that she has been smoking Cigarettes.  She has a 10.00 pack-year smoking history. She has never used smokeless tobacco. She reports that she does not drink alcohol or use drugs.   Family History:  The patient's family history includes Asthma in her other; COPD in her other; Cancer in her father, maternal grandfather, maternal grandmother, mother, and other; Diabetes in her maternal aunt and other; Heart attack in her maternal aunt and other; Heart disease in her maternal aunt and other; Hyperlipidemia in her other; Hypertension in her maternal aunt and other; Obesity in her other.    ROS:  Please see the history of present illness. All other systems are reviewed and negative.    PHYSICAL EXAM: VS:  There were no vitals  taken for this visit. , BMI There is no height or weight on file to calculate BMI. GEN: Well nourished, well developed, female in no acute distress  HEENT: normal for age  Neck: no JVD, no carotid bruit, no masses Cardiac: RRR; no murmur, no rubs, or gallops Respiratory:  clear to auscultation bilaterally, normal work of breathing GI: soft, nontender, nondistended, + BS MS: no deformity or atrophy; no edema; distal pulses are 2+ in all 4 extremities   Skin: warm and dry, no rash Neuro:  Strength and sensation are intact Psych: euthymic mood, full  affect   EKG:  EKG {ACTION; IS/IS ZOX:[id number]} ordered today. The ekg ordered today demonstrates ***   Recent Labs: No results found for requested labs within last 8760 hours.    Lipid Panel    Component Value Date/Time   CHOL 199 02/23/2015 0833   TRIG 171 (H) 02/23/2015 0833   HDL 45 (L) 02/23/2015 0833   CHOLHDL 4.4 02/23/2015 0833   VLDL 34 (H) 02/23/2015 0833   LDLCALC 120 02/23/2015 0833     Wt Readings from Last 3 Encounters:  01/26/17 200 lb (90.7 kg)  08/14/16 200 lb (90.7 kg)  06/22/16 200 lb (90.7 kg)     Other studies Reviewed: Additional studies/ records that were reviewed today include: ***.  ASSESSMENT AND PLAN:  1.  ***   Current medicines are reviewed at length with the patient today.  The patient {ACTIONS; HAS/DOES NOT HAVE:19233} concerns regarding medicines.  The following changes have been made:  {PLAN; NO CHANGE:13088:s}  Labs/ tests ordered today include: *** No orders of the defined types were placed in this encounter.    Disposition:   FU with Dr. Duke Salvia  Signed, Sameria Morss, Bjorn Loser, PA-C  02/21/2017 12:28 PM    [facility] Phone: [phone]; Fax: [phone]  This note was written with the assistance of speech recognition software. Please excuse any transcriptional errors.

## 2017-03-22 ENCOUNTER — Ambulatory Visit
Admission: AD | Admit: 2017-03-22 | Discharge: 2017-03-22 | Disposition: A | Discharge status: Home / Self Care | Payer: PRIVATE HEALTH INSURANCE | Source: Ambulatory Visit | Attending: Family Medicine | Admitting: Family Medicine

## 2017-03-22 ENCOUNTER — Encounter: Payer: Self-pay | Admitting: Family

## 2017-03-22 DIAGNOSIS — J019 Acute sinusitis, unspecified: Secondary | ICD-10-CM

## 2017-03-22 DIAGNOSIS — B9789 Other viral agents as the cause of diseases classified elsewhere: Secondary | ICD-10-CM

## 2017-03-22 LAB — HM HIV SCREENING OFFERED

## 2017-03-22 NOTE — UC Provider Note (Signed)
History     Chief Complaint   Patient presents with    Nasal Congestion     congestion for past few days. Fevers     HPI Comments: [place] leaving in a week. Has felt feverish but has not checked temp. +allergies, using Zyrtec and Flonase nasal spray prn but this feels different. +subjective fever, sinus congestion for 4 days, getting worse. See below.     Patient is a 31 y.o. female presenting with URI.   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 No pertinent past medical history.     There is no problem list on file for this patient.           History reviewed. No pertinent surgical history.  History reviewed. No pertinent family history.       Social History   Substance Use Topics    Smoking status: Never Smoker    Smokeless tobacco: Never Used    Alcohol use None     Living Situation     Questions Responses    Patient lives with     Homeless     Caregiver for other family member     External Services     Employment     Domestic Violence Risk                 Review of Systems   Review of Systems   Constitutional: Positive for fatigue and fever. Negative for appetite change and chills.   HENT: Positive for congestion, ear pain, rhinorrhea and sinus pain. Negative for sinus pressure and sore throat.    Eyes: Negative for  redness.   Respiratory: Positive for cough (dry). Negative for shortness of breath and wheezing.    Cardiovascular: Negative for chest pain.   Gastrointestinal: Negative for abdominal pain.   Genitourinary: Negative for difficulty urinating.   Skin: Negative for rash.   Neurological: Positive for headaches.       Physical Exam   Triage Vitals  Triage Start: Start, (03/22/17 1746)   First Recorded BP: 131/79, Resp: 18, Temp: 37.1 C (98.8 F), Temp src: TEMPORAL Oxygen Therapy SpO2: 98 %, Oximetry Source: Lt Hand, O2 Device: None (Room air), Heart Rate: 71, (03/22/17 1747)  .      Physical Exam   Constitutional: She is oriented to person, place, and time. She appears well-developed and well-nourished. No distress.   HENT:   Head: Normocephalic and atraumatic.   Right Ear: External ear and ear canal normal. Tympanic membrane is not erythematous.   Left Ear: External ear and ear canal normal. Tympanic membrane is not erythematous.   Nose: Right sinus exhibits maxillary sinus tenderness and frontal sinus tenderness. Left sinus exhibits maxillary sinus tenderness and frontal sinus  tenderness.   Mouth/Throat: Posterior oropharyngeal edema present. No oropharyngeal exudate or posterior oropharyngeal erythema.   Eyes: Conjunctivae and EOM are normal. Pupils are equal, round, and reactive to light. Right eye exhibits no discharge. Left eye exhibits no discharge.   Neck: Normal range of motion. Neck supple. No tracheal deviation present.   Cardiovascular: Normal rate, regular rhythm, normal heart sounds and intact distal pulses.    Pulmonary/Chest: Effort normal and breath sounds normal. No respiratory distress. She has no wheezes. She has no rales.   Abdominal: Soft. She exhibits no distension. There is no tenderness.   Musculoskeletal: Normal range of motion.   Lymphadenopathy:     She has no cervical adenopathy.   Neurological: She is alert and oriented to person, place, and time.   Skin: Skin is warm and dry. No rash  noted. She is not diaphoretic.   Psychiatric: She has a normal mood and affect. Her behavior is normal.   Nursing note and vitals reviewed.       Medical Decision Making        Initial Evaluation:  ED First Provider Contact     Date/Time Event User Comments    03/22/17 1750 ED First Provider Contact Manning Charity Initial Face to Face Provider Contact          Patient was seen on: 03/22/2017        Assessment:  31 y.o.female comes to the Urgent Care Center with sinus congestion for 4 days.    Differential Diagnosis includes:  Acute Bacterial Sinusitis  Acute Viral Sinusitis  Acute Allergic Sinusitis  Acute URI NOS    No evidence of bacterial infection requiring antibiotics at this time.  Education provided, with emphasis on supportive care including OTC analgesics for pain and fever, decongestants as needed, and fluids and rest.  Warning precautions reviewed.      Conservative management advised.  Precautions reviewed.  Follow up with PCP back in [place] (going home in a week) or at UC prn.         Plan:   Orders Placed This Encounter    HM HIV SCREENING OFFERED       Recent Results (from the past 24 hour(s))   HM HIV SCREENING OFFERED    Collection Time: 03/22/17  5:49 PM   Result Value Ref Range    HM HIV SCREENING OFFERED Declined          Final Diagnosis    ICD-10-CM ICD-9-CM   1. Acute viral sinusitis J01.90 461.9    B97.89 079.99       Encourage fluids, encourage rest, good hand hygiene.    Use over the counter medications as discussed.    Please start the new medications as below:    There are no discharge medications for this patient.      Please follow up with your physician as below:    Follow-up Information     Follow up with [facility].    Specialty:  Emergency Medicine    Why:  As needed before you go home in [place] next Thursday    Contact information:    [address]  [place] [address]  [phone]        Thank you Erin Fulling for coming to [facility] for your health care concerns.    If your condition changes and/or worsens please follow up with her primary doctor and/or return to the urgent care center.  If short of breath, chest pains or any other concerns please report to the emergency room.    In the event of an Emergency dial 911.      Final Diagnosis  Final diagnoses:   [J01.90, B97.89] Acute viral sinusitis (Primary)         Manning CharitySACHIKO Warren Kugelman, MD              Manning CharityKaizuka, Shaylah Mcghie, MD  03/22/17 [phone]

## 2017-03-22 NOTE — Discharge Instructions (Signed)
PLEASE REVIEW ALL INSTRUCTIONS FOR DETAILS AND CONTENT CONTAINED IN THE DISCHARGE MATERIALS NOT COVERED AT DISCHARGE.    Thank you Loretta Levine for coming to [facility] for your health care concerns.    If your condition changes and/or worsens please follow up with your primary doctor and/or return to the urgent care center.    In the event of an emergency please dial 911.    Rest, hydrate, run a humidifier in your bedroom at night    Elevate the head at night with an extra pillow to help reduce coughing as needed    Mucinex (basic formula) every 12 hours for the next 3-5 days if mucous is thick    Ibuprofen 600 mg every 6 hours for the next 3-5 days as needed for fever, bodyaches, and/or pain    Warm showers can be helpful right before bed and first thing in the morning to loosen any mucous     You can take elderberry extract (Sambucus) or Umcka (Pelargonium sidoides), Echinacea, Vitamin C and zinc for supportive care.     Please try Lloyd Hugereil Rinse or similar sinus rinsing then Flonase nasal spray to reduce the sinus congestion.  Zyrtec can reduce runny nose but it could dry you up - try it and use it only if it helps.     Follow up with your primary care provider if symptoms worsen or don't improve.

## 2017-04-18 ENCOUNTER — Telehealth: Payer: Self-pay | Admitting: Cardiovascular Disease

## 2017-04-18 NOTE — Telephone Encounter (Signed)
Spoke with pt, for the last week she has had a soreness in the top of her chest. Today she suddenly had 3 sharpe pains and now she reports the top of her chest and under her right breast feels tight. She describes a little SOB. She reports the soreness gets worse when she moves or when she touches the area. Explained to patient that it does not sound like heart pain. She reports missing her last appointment, tried to make the patient a follow up with dr Duke Salviarandolph and she declined at this time die to car trouble and no transportation. She will call back to schedule follow up or if symptoms change or worsen. Pt agreed with this plan.

## 2017-04-18 NOTE — Telephone Encounter (Signed)
New message     Pt c/o of Chest Pain: STAT if CP now or developed within 24 hours  1. Are you having CP right now? Yes and tightness stabbing pain under her breasts   2. Are you experiencing any other symptoms (ex. SOB, nausea, vomiting, sweating)? Sob   3. How long have you been experiencing CP? Muscle pain under breast a week ago , sharp stabbing pain about hour  4. Is your CP continuous or coming and going?  continuous for the tightness, sharp pain comes and goes 5. Have you taken Nitroglycerin?  no ?

## 2017-07-06 ENCOUNTER — Encounter (HOSPITAL_COMMUNITY): Payer: Self-pay | Admitting: Emergency Medicine

## 2017-07-06 ENCOUNTER — Ambulatory Visit (HOSPITAL_COMMUNITY)
Admission: EM | Admit: 2017-07-06 | Discharge: 2017-07-06 | Disposition: A | Discharge status: Home / Self Care | Payer: Medicaid Other | Attending: Family Medicine | Admitting: Family Medicine

## 2017-07-06 ENCOUNTER — Other Ambulatory Visit: Payer: Self-pay

## 2017-07-06 ENCOUNTER — Emergency Department (HOSPITAL_COMMUNITY)
Admission: EM | Admit: 2017-07-06 | Discharge: 2017-07-06 | Disposition: A | Discharge status: Home / Self Care | Payer: Medicaid Other | Attending: Emergency Medicine | Admitting: Emergency Medicine

## 2017-07-06 DIAGNOSIS — R1030 Lower abdominal pain, unspecified: Secondary | ICD-10-CM | POA: Insufficient documentation

## 2017-07-06 DIAGNOSIS — Z113 Encounter for screening for infections with a predominantly sexual mode of transmission: Secondary | ICD-10-CM | POA: Diagnosis not present

## 2017-07-06 DIAGNOSIS — Z3202 Encounter for pregnancy test, result negative: Secondary | ICD-10-CM

## 2017-07-06 DIAGNOSIS — F329 Major depressive disorder, single episode, unspecified: Secondary | ICD-10-CM | POA: Diagnosis not present

## 2017-07-06 DIAGNOSIS — J45909 Unspecified asthma, uncomplicated: Secondary | ICD-10-CM | POA: Insufficient documentation

## 2017-07-06 DIAGNOSIS — Z79899 Other long term (current) drug therapy: Secondary | ICD-10-CM | POA: Insufficient documentation

## 2017-07-06 DIAGNOSIS — Z202 Contact with and (suspected) exposure to infections with a predominantly sexual mode of transmission: Secondary | ICD-10-CM | POA: Insufficient documentation

## 2017-07-06 DIAGNOSIS — F909 Attention-deficit hyperactivity disorder, unspecified type: Secondary | ICD-10-CM | POA: Diagnosis not present

## 2017-07-06 DIAGNOSIS — F419 Anxiety disorder, unspecified: Secondary | ICD-10-CM | POA: Insufficient documentation

## 2017-07-06 DIAGNOSIS — R001 Bradycardia, unspecified: Secondary | ICD-10-CM | POA: Insufficient documentation

## 2017-07-06 DIAGNOSIS — R110 Nausea: Secondary | ICD-10-CM | POA: Diagnosis not present

## 2017-07-06 DIAGNOSIS — F17210 Nicotine dependence, cigarettes, uncomplicated: Secondary | ICD-10-CM | POA: Diagnosis not present

## 2017-07-06 DIAGNOSIS — Z5321 Procedure and treatment not carried out due to patient leaving prior to being seen by health care provider: Secondary | ICD-10-CM | POA: Diagnosis not present

## 2017-07-06 LAB — POCT URINALYSIS DIP (DEVICE)
Bilirubin Urine: NEGATIVE
Glucose, UA: NEGATIVE mg/dL
Hgb urine dipstick: NEGATIVE
Ketones, ur: NEGATIVE mg/dL
Nitrite: NEGATIVE
PROTEIN: NEGATIVE mg/dL
SPECIFIC GRAVITY, URINE: 1.01 (ref 1.005–1.030)
UROBILINOGEN UA: 0.2 mg/dL (ref 0.0–1.0)
pH: 5.5 (ref 5.0–8.0)

## 2017-07-06 MED ORDER — AZITHROMYCIN 250 MG PO TABS
1000.0000 mg | ORAL_TABLET | Freq: Once | ORAL | Status: AC
  Administered 2017-07-06: 1000 mg via ORAL

## 2017-07-06 MED ORDER — CEFTRIAXONE SODIUM 250 MG IJ SOLR: 250.0000 mg | Freq: Once | INTRAMUSCULAR | Status: DC

## 2017-07-06 MED ORDER — AZITHROMYCIN 250 MG PO TABS
ORAL_TABLET | ORAL | Status: AC
  Filled 2017-07-06: qty 4

## 2017-07-06 NOTE — Discharge Instructions (Signed)
We have treated you today for chlamydia, with azithromycin. Please refrain from sexual intercourse for 7 days while medicines eliminating infection.  ° °We are testing you for Gonorrhea, Chlamydia, Trichomonas, Yeast and Bacterial Vaginosis. We will call you if anything is positive and let you know if you require any further treatment. Please inform partners of any positive results.  ° °Please return if symptoms not improving with treatment, development of fever, nausea, vomiting, abdominal pain.  °

## 2017-07-06 NOTE — ED Triage Notes (Signed)
Pt verbalizes exposure to STD; complaint of low abdominal pain and discharge/odor.

## 2017-07-06 NOTE — ED Provider Notes (Signed)
[facility]    CSN: [id number] Arrival date & time: 07/06/17  1819     History   Chief Complaint Chief Complaint  Patient presents with  . Exposure to STD    HPI Jamie Palmer is a 32 y.o. female no contributing past medical history presenting today with concern for STD exposure.  She states she has had a possible exposure to chlamydia.  She does not have any current symptoms at this time except for some occasional lower abdominal pain.  Denies fever.  Does have occasional nausea, denies vomiting.  Denies back pain.  Denies any urinary symptoms of dysuria, increased frequency, urgency.  HPI  Past Medical History:  Diagnosis Date  . ADHD (attention deficit hyperactivity disorder)   . Anxiety   . Asthma    Uses inhaler prn  . Bradycardia 02/23/2015  . Depression   . Dizziness 06/10/2015  . Gestational diabetes    glyburide  . Shingles     Patient Active Problem List   Diagnosis Date Noted  . Dizziness 06/10/2015  . Bradycardia 02/23/2015  . Diabetes mellitus during pregnancy in third trimester 11/26/2014  . Gestational diabetes 03/29/2012  . SVD (spontaneous vaginal delivery) 03/29/2012    Past Surgical History:  Procedure Laterality Date  . APPENDECTOMY    . FOOT SURGERY    . TUBAL LIGATION Bilateral 11/27/2014   Procedure: POST PARTUM TUBAL LIGATION;  Surgeon: Lavina Hamman, MD;  Location: [facility];  Service: Gynecology;  Laterality: Bilateral;    OB History    Gravida Para Term Preterm AB Living   3 3 3  0 0 3   SAB TAB Ectopic Multiple Live Births   0 0 0 0 3       Home Medications    Prior to Admission medications   Medication Sig Start Date End Date Taking? Authorizing Provider  albuterol (PROVENTIL HFA;VENTOLIN HFA) 108 (90 Base) MCG/ACT inhaler Inhale 2 puffs into the lungs every 6 (six) hours as needed for wheezing or shortness of breath. Patient not taking: Reported on 08/09/2016 06/22/16   Enid Derry, PA-C  albuterol (PROVENTIL  HFA;VENTOLIN HFA) 108 [phone] Base) MCG/ACT inhaler 1-2 puffs every 6 hours while awake for the next 36 hours then prn cough/wheeze/sob 08/09/16   Deatra Canter, FNP  fludrocortisone (FLORINEF) 0.1 MG tablet Take 1 tablet (0.1 mg total) by mouth daily. Patient not taking: Reported on 01/26/2017 06/10/15   Chilton Si, MD  ibuprofen (ADVIL,MOTRIN) 800 MG tablet Take 1 tablet (800 mg total) by mouth 3 (three) times daily. Patient not taking: Reported on 01/26/2017 02/20/16   Cheri Fowler, PA-C  ibuprofen (ADVIL,MOTRIN) 800 MG tablet Take 1 tablet (800 mg total) by mouth every 8 (eight) hours as needed. 01/26/17   Emily Filbert, MD  metroNIDAZOLE (FLAGYL) 500 MG tablet Take 1 tablet (500 mg total) by mouth 2 (two) times daily. 01/26/17   Emily Filbert, MD    Family History Family History  Problem Relation Age of Onset  . Diabetes Other   . Hypertension Other   . Heart disease Other   . Cancer Other   . COPD Other   . Asthma Other   . Heart attack Other   . Obesity Other   . Hyperlipidemia Other   . Cancer Mother        lung  . Cancer Maternal Grandmother        lung  . Cancer Father   . Heart attack Maternal Aunt   .  Hypertension Maternal Aunt   . Diabetes Maternal Aunt   . Heart disease Maternal Aunt   . Cancer Maternal Grandfather        lung  . Other Neg Hx     Social History Social History   Tobacco Use  . Smoking status: Current Every Day Smoker    Packs/day: 1.00    Years: 10.00    Pack years: 10.00    Types: Cigarettes  . Smokeless tobacco: Never Used  Substance Use Topics  . Alcohol use: No    Alcohol/week: 0.0 oz  . Drug use: No    Comment: previous drug addiction 7 years ago     Allergies   Patient has no known allergies.   Review of Systems Review of Systems  Constitutional: Negative for fever.  Respiratory: Negative for shortness of breath.   Cardiovascular: Negative for chest pain.  Gastrointestinal: Positive for abdominal pain and  nausea. Negative for diarrhea and vomiting.  Genitourinary: Negative for dysuria, flank pain, genital sores, hematuria, menstrual problem, vaginal bleeding, vaginal discharge and vaginal pain.  Musculoskeletal: Negative for back pain.  Skin: Negative for rash.  Neurological: Negative for dizziness, light-headedness and headaches.     Physical Exam Triage Vital Signs ED Triage Vitals  Enc Vitals Group     BP 07/06/17 1844 130/79     Pulse Rate 07/06/17 1844 94     Resp --      Temp 07/06/17 1844 99.3 F (37.4 C)     Temp Source 07/06/17 1844 Oral     SpO2 07/06/17 1844 100 %     Weight --      Height --      Head Circumference --      Peak Flow --      Pain Score 07/06/17 1842 0     Pain Loc --      Pain Edu? --      Excl. in GC? --    No data found.  Updated Vital Signs BP 130/79 (BP Location: Right Arm)   Pulse 94   Temp 99.3 F (37.4 C) (Oral)   LMP 06/28/2017 (Exact Date)   SpO2 100%   Visual Acuity Right Eye Distance:   Left Eye Distance:   Bilateral Distance:    Right Eye Near:   Left Eye Near:    Bilateral Near:     Physical Exam  Constitutional: She appears well-developed and well-nourished. No distress.  HENT:  Head: Normocephalic and atraumatic.  Eyes: Conjunctivae are normal.  Neck: Neck supple.  Cardiovascular: Normal rate and regular rhythm.  No murmur heard. Pulmonary/Chest: Effort normal and breath sounds normal. No respiratory distress.  Abdominal: Soft. There is no tenderness.  Abdomen soft, patient does not guard during exam.  Mild tenderness to palpation of right and left lower quadrants, negative rebound, negative McBurney's, negative Rovsing.  Genitourinary:  Genitourinary Comments: Pelvic exam deferred-patient not having any pelvic pain, new rashes bumps or lesions.  Musculoskeletal: She exhibits no edema.  Neurological: She is alert.  Skin: Skin is warm and dry.  Psychiatric: She has a normal mood and affect.  Nursing note and  vitals reviewed.    UC Treatments / Results  Labs (all labs ordered are listed, but only abnormal results are displayed) Labs Reviewed  POCT URINALYSIS DIP (DEVICE) - Abnormal; Notable for the following components:      Result Value   Leukocytes, UA SMALL (*)    All other components within normal limits  URINE  CULTURE  URINE CYTOLOGY ANCILLARY ONLY    EKG  EKG Interpretation None       Radiology No results found.  Procedures Procedures (including critical care time)  Medications Ordered in UC Medications  azithromycin (ZITHROMAX) tablet 1,000 mg (1,000 mg Oral Given 07/06/17 1901)     Initial Impression / Assessment and Plan / UC Course  I have reviewed the triage vital signs and the nursing notes.  Pertinent labs & imaging results that were available during my care of the patient were reviewed by me and considered in my medical decision making (see chart for details).     Patient with concern of exposure to chlamydia, will go ahead and treat for this.  Urine cytology obtained to check for yeast, BV, STDs.  Patient relatively asymptomatic at this time. Discussed strict return precautions. Patient verbalized understanding and is agreeable with plan.   Final Clinical Impressions(s) / UC Diagnoses   Final diagnoses:  STD exposure    ED Discharge Orders    None       Controlled Substance Prescriptions [place] Controlled Substance Registry consulted? Not Applicable   Lew DawesWieters, Areesha Dehaven C, [place] JerseyPA-C 07/06/17 1906

## 2017-07-06 NOTE — ED Triage Notes (Signed)
Pt was told today by another woman who has had sexual intercourse with the same man she has had relations with that she has chlamydia.  Pt is having no symptoms, but she is here with another woman who has also been with this gentleman and the girl that originally told her that she was exposed.

## 2017-07-08 LAB — URINE CULTURE

## 2017-07-09 LAB — URINE CYTOLOGY ANCILLARY ONLY
Chlamydia: NEGATIVE
Neisseria Gonorrhea: NEGATIVE
Trichomonas: NEGATIVE

## 2017-07-09 LAB — POCT PREGNANCY, URINE: PREG TEST UR: NEGATIVE

## 2017-07-11 LAB — URINE CYTOLOGY ANCILLARY ONLY: Candida vaginitis: NEGATIVE

## 2017-07-13 ENCOUNTER — Telehealth (HOSPITAL_COMMUNITY): Payer: Self-pay | Admitting: Emergency Medicine

## 2017-07-13 MED ORDER — METRONIDAZOLE 500 MG PO TABS: 500.0000 mg | ORAL_TABLET | Freq: Two times a day (BID) | ORAL | 0 refills | Status: DC

## 2017-07-13 NOTE — Telephone Encounter (Signed)
Called pt to notify of recent lab results.... Pt ID'd properly Reports feeling better and sx are subsiding Denies vag d/c and irritation but request for Flagyl to be sent to [facility] on [address] in [place] Pt is taking/tolarating well meds given at visit.  Adv pt if sx are not getting better to return or to f/u w/PCP Education on safe sex given Notified pt that lab results can be obtained through MyChart Pt verb understanding.

## 2017-07-13 NOTE — Telephone Encounter (Signed)
-----   Message from Isa RankinLaura Wilson Murray, MD sent at 07/12/2017  9:38 AM EST ----- Clinical staff, please let patient know that test for gardnerella (bacterial vaginosis) was positive.  This only needs to be treated if there are persistent symptoms, such as vaginal irritation/discharge.   If these symptoms are present, ok to send rx for metronidazole 500mg  bid x 7d #14 no refills or metronidazole vaginal gel 0.75% 1 applicatorful bid x 7d #14 no refills.  Urine culture does not suggest a UTI.   Recheck or followup with PCP for further evaluation if symptoms are not improving.  LM

## 2017-07-24 IMAGING — CR DG LUMBAR SPINE COMPLETE 4+V
5 series · 5 of 5 positions shown · non-contrast
Comparison: 01/17/2011

CLINICAL DATA: Hyperextension injury yesterday. Low back pain.
Initial encounter.

EXAM:
LUMBAR SPINE - COMPLETE 4+ VIEW

[t lumbar spine ap]
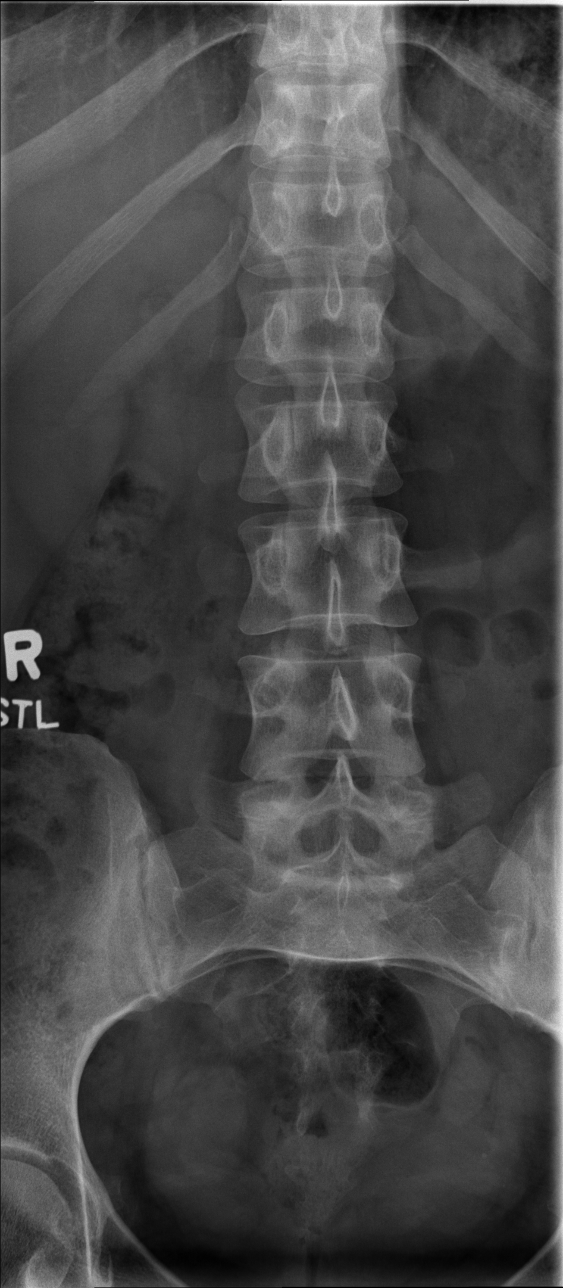

[t lumbar spine obl (1 of 2)]
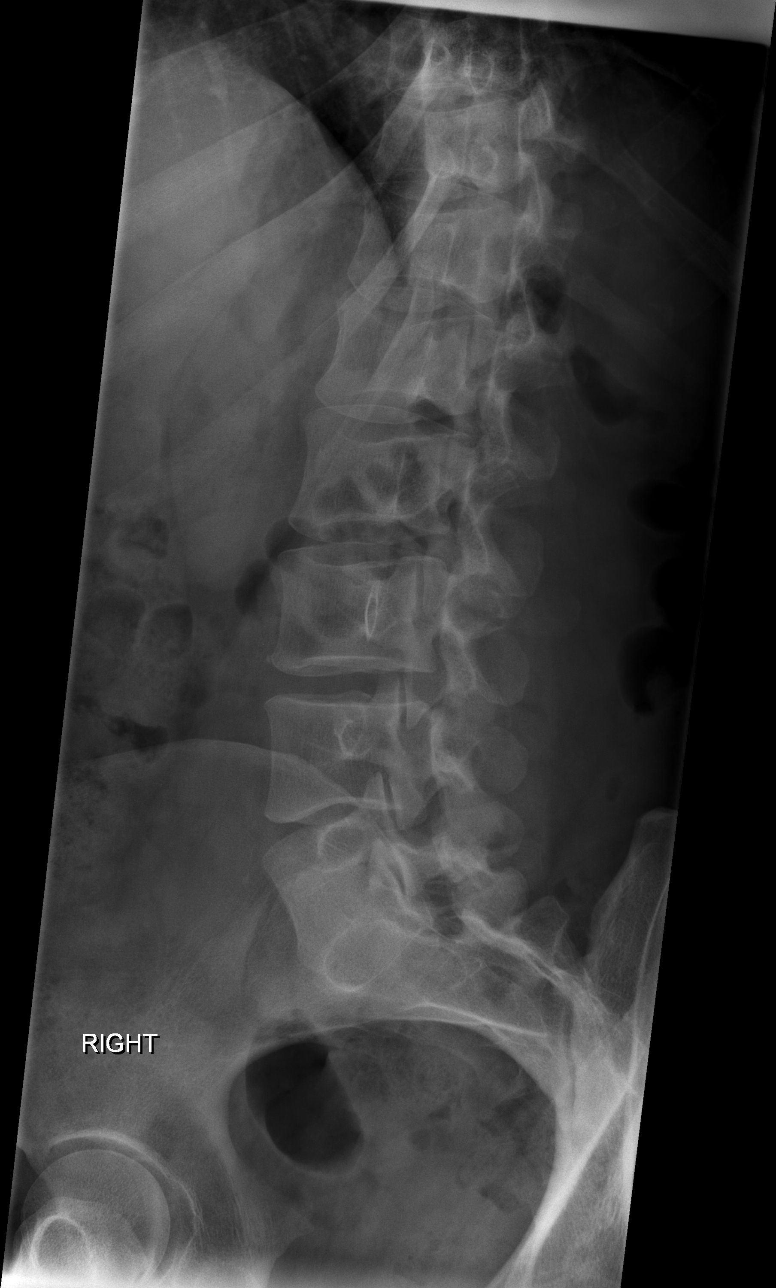

[t lumbar spine obl (2 of 2)]
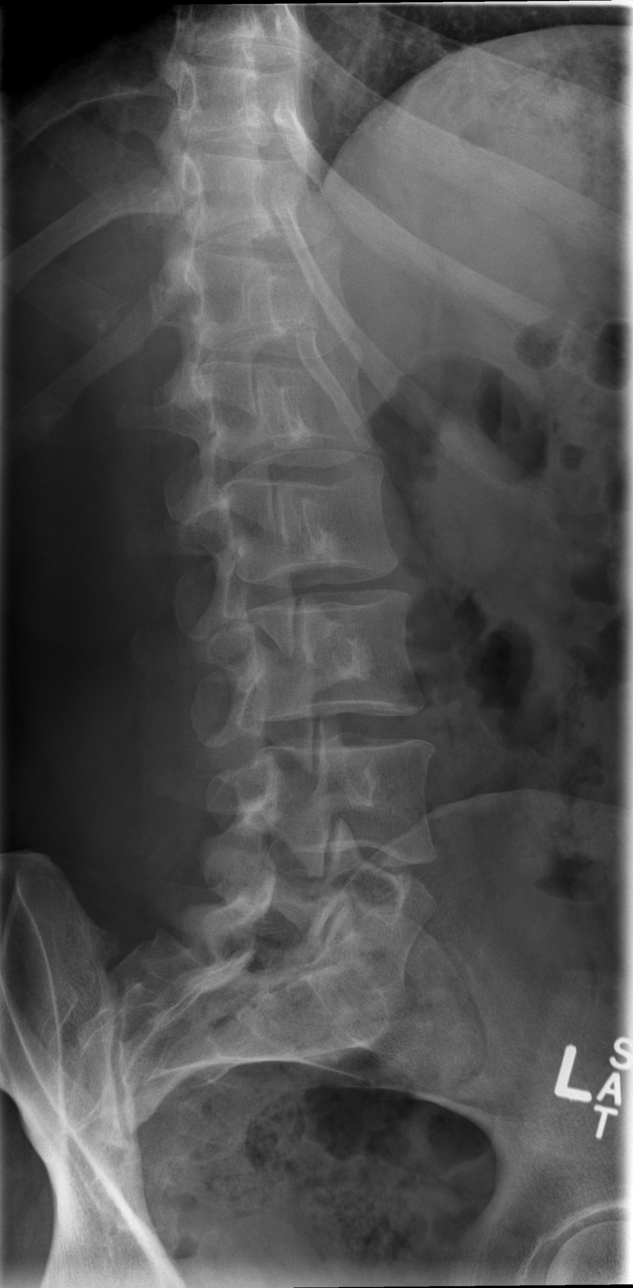

[t lumbar spine lat]
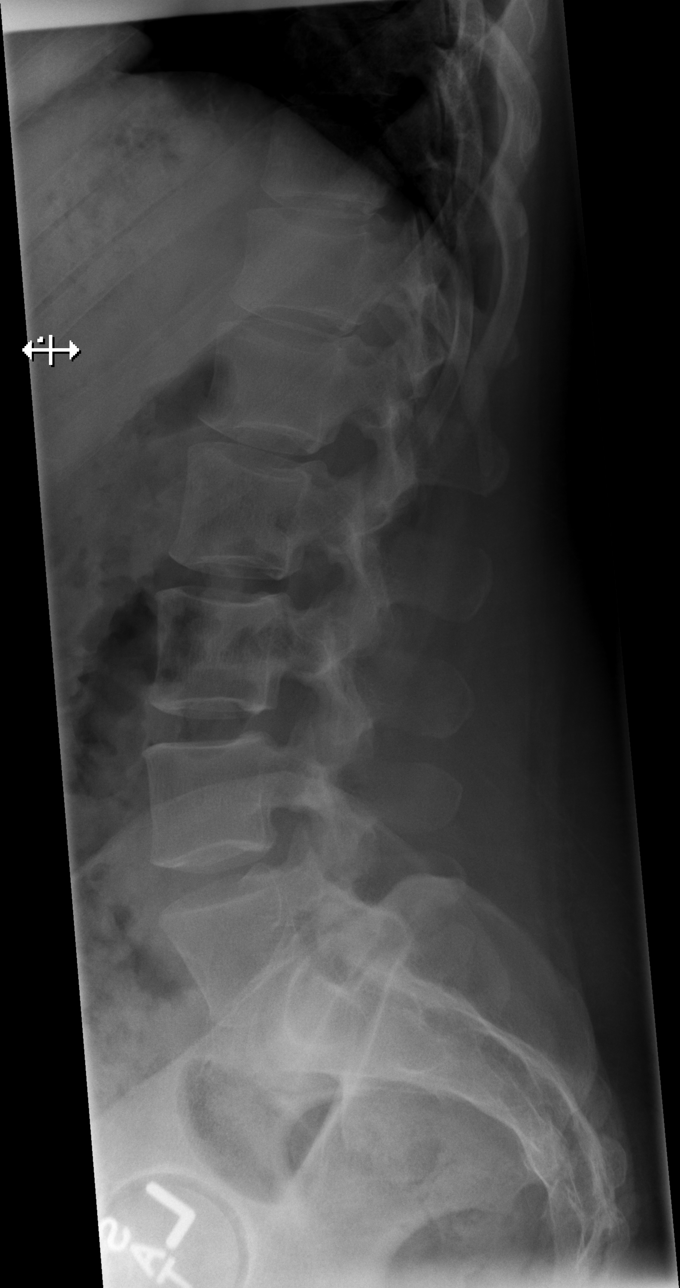

[t lumbar l-5 s-1 spot]
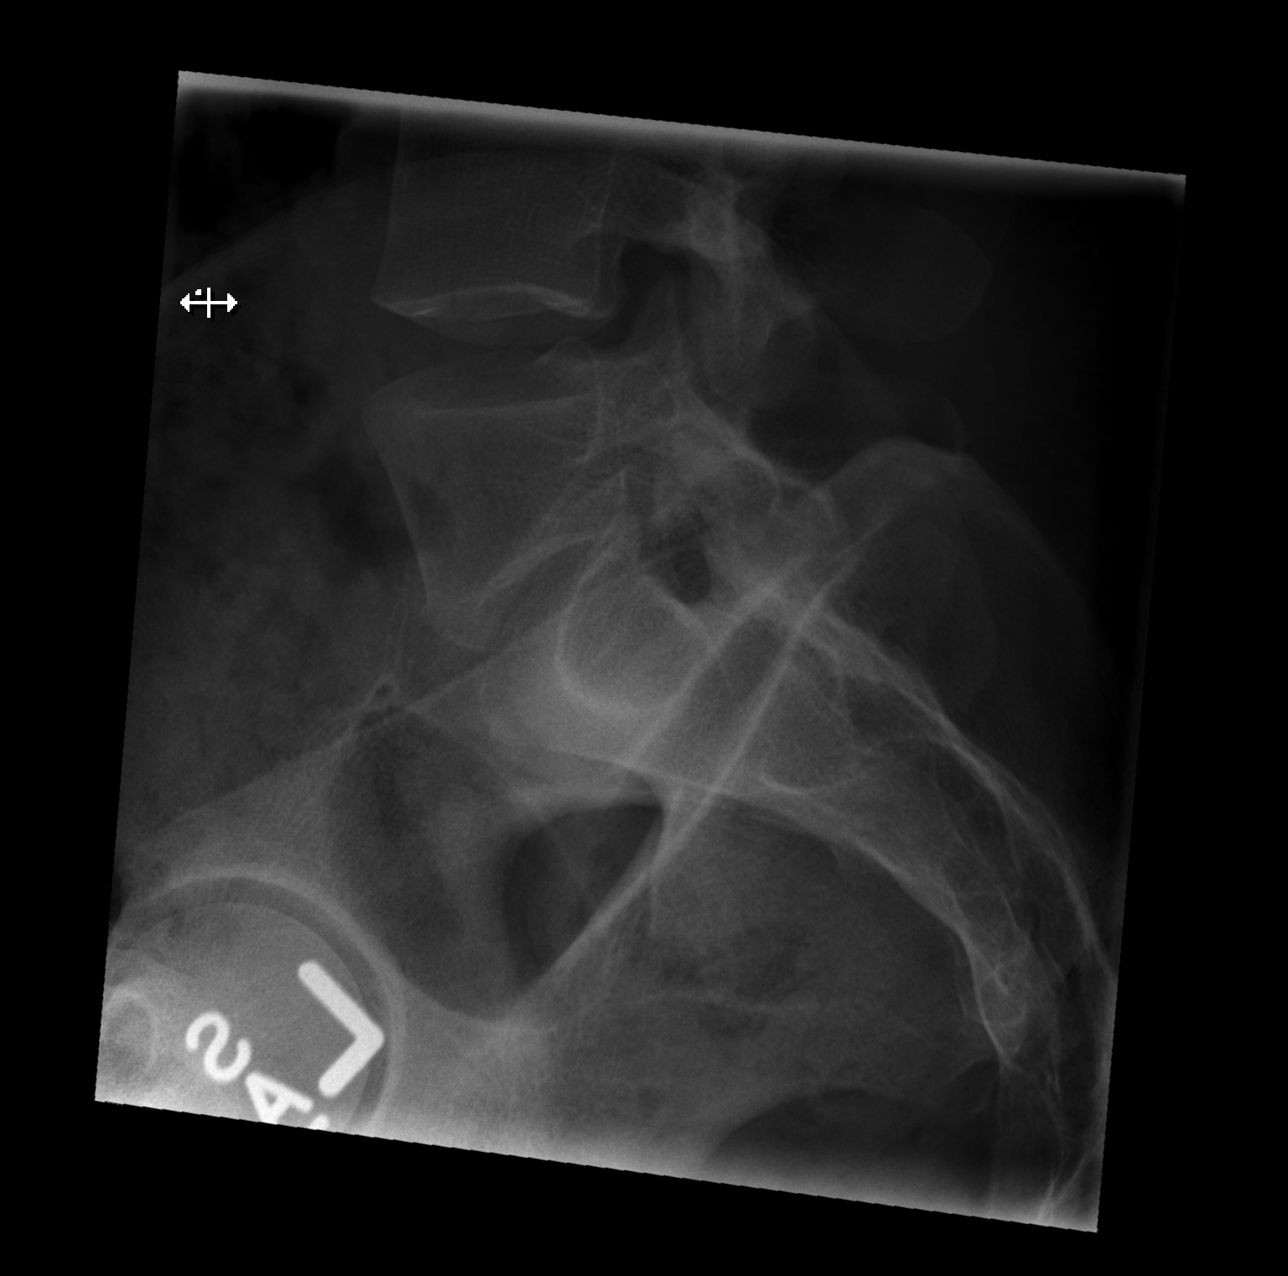

[5 of 5 positions shown; findings below may reference images not displayed]

FINDINGS: There are 5 non rib-bearing lumbar type vertebral bodies. Vertebral
alignment is normal. Vertebral body heights and intervertebral disc
space heights are preserved. No pars defects are seen. No
destructive osseous lesion or soft tissue abnormality is seen.
IMPRESSION: Negative.

## 2017-09-18 ENCOUNTER — Encounter (HOSPITAL_COMMUNITY): Payer: Self-pay

## 2017-09-18 ENCOUNTER — Emergency Department (HOSPITAL_COMMUNITY): Payer: Medicaid Other

## 2017-09-18 ENCOUNTER — Emergency Department (HOSPITAL_COMMUNITY)
Admission: EM | Admit: 2017-09-18 | Discharge: 2017-09-19 | Disposition: A | Discharge status: Home / Self Care | Payer: Medicaid Other | Attending: Emergency Medicine | Admitting: Emergency Medicine

## 2017-09-18 DIAGNOSIS — M25561 Pain in right knee: Secondary | ICD-10-CM | POA: Diagnosis not present

## 2017-09-18 DIAGNOSIS — M542 Cervicalgia: Secondary | ICD-10-CM | POA: Insufficient documentation

## 2017-09-18 DIAGNOSIS — M25522 Pain in left elbow: Secondary | ICD-10-CM | POA: Diagnosis not present

## 2017-09-18 DIAGNOSIS — M79672 Pain in left foot: Secondary | ICD-10-CM | POA: Diagnosis not present

## 2017-09-18 DIAGNOSIS — Y939 Activity, unspecified: Secondary | ICD-10-CM | POA: Insufficient documentation

## 2017-09-18 DIAGNOSIS — F17210 Nicotine dependence, cigarettes, uncomplicated: Secondary | ICD-10-CM | POA: Diagnosis not present

## 2017-09-18 DIAGNOSIS — Z79899 Other long term (current) drug therapy: Secondary | ICD-10-CM | POA: Insufficient documentation

## 2017-09-18 DIAGNOSIS — S0990XA Unspecified injury of head, initial encounter: Secondary | ICD-10-CM | POA: Diagnosis present

## 2017-09-18 DIAGNOSIS — M791 Myalgia, unspecified site: Secondary | ICD-10-CM | POA: Diagnosis not present

## 2017-09-18 DIAGNOSIS — S0101XA Laceration without foreign body of scalp, initial encounter: Secondary | ICD-10-CM | POA: Insufficient documentation

## 2017-09-18 DIAGNOSIS — Y92410 Unspecified street and highway as the place of occurrence of the external cause: Secondary | ICD-10-CM | POA: Insufficient documentation

## 2017-09-18 DIAGNOSIS — Y998 Other external cause status: Secondary | ICD-10-CM | POA: Insufficient documentation

## 2017-09-18 DIAGNOSIS — M7918 Myalgia, other site: Secondary | ICD-10-CM

## 2017-09-18 DIAGNOSIS — J45909 Unspecified asthma, uncomplicated: Secondary | ICD-10-CM | POA: Diagnosis not present

## 2017-09-18 MED ORDER — TETANUS-DIPHTH-ACELL PERTUSSIS 5-2.5-18.5 LF-MCG/0.5 IM SUSP: 0.5000 mL | Freq: Once | INTRAMUSCULAR | Status: DC

## 2017-09-18 MED ORDER — LIDOCAINE-EPINEPHRINE (PF) 2 %-1:200000 IJ SOLN
10.0000 mL | Freq: Once | INTRAMUSCULAR | Status: AC
  Administered 2017-09-18: 10 mL
  Filled 2017-09-18: qty 20

## 2017-09-18 MED ORDER — OXYCODONE-ACETAMINOPHEN 5-325 MG PO TABS
1.0000 | ORAL_TABLET | Freq: Once | ORAL | Status: AC
  Administered 2017-09-18: 1 via ORAL
  Filled 2017-09-18: qty 1
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
## 2017-09-18 NOTE — ED Notes (Signed)
Tech at bedside attempting to wash out wounds

## 2017-09-19 MED ORDER — OXYCODONE-ACETAMINOPHEN 5-325 MG PO TABS
1.0000 | ORAL_TABLET | Freq: Once | ORAL | Status: AC
  Administered 2017-09-19: 1 via ORAL
  Filled 2017-09-19: qty 1

## 2017-09-19 MED ORDER — OXYCODONE-ACETAMINOPHEN 5-325 MG PO TABS: 1.0000 | ORAL_TABLET | Freq: Four times a day (QID) | ORAL | 0 refills | Status: DC | PRN

## 2017-09-19 MED ORDER — METHOCARBAMOL 500 MG PO TABS: 500.0000 mg | ORAL_TABLET | Freq: Two times a day (BID) | ORAL | 0 refills | Status: DC

## 2017-09-19 MED ORDER — IBUPROFEN 600 MG PO TABS: 600.0000 mg | ORAL_TABLET | Freq: Four times a day (QID) | ORAL | 0 refills | Status: DC | PRN
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
## 2017-09-26 ENCOUNTER — Emergency Department
Admission: EM | Admit: 2017-09-26 | Discharge: 2017-09-26 | Disposition: A | Discharge status: Home / Self Care | Payer: Medicaid Other | Attending: Emergency Medicine | Admitting: Emergency Medicine

## 2017-09-26 ENCOUNTER — Encounter: Payer: Self-pay | Admitting: Emergency Medicine

## 2017-09-26 DIAGNOSIS — Z79899 Other long term (current) drug therapy: Secondary | ICD-10-CM | POA: Insufficient documentation

## 2017-09-26 DIAGNOSIS — Z4802 Encounter for removal of sutures: Secondary | ICD-10-CM | POA: Insufficient documentation

## 2017-09-26 DIAGNOSIS — F17210 Nicotine dependence, cigarettes, uncomplicated: Secondary | ICD-10-CM | POA: Insufficient documentation

## 2017-09-26 DIAGNOSIS — J45909 Unspecified asthma, uncomplicated: Secondary | ICD-10-CM | POA: Insufficient documentation
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
## 2018-01-03 ENCOUNTER — Other Ambulatory Visit: Payer: Self-pay

## 2018-01-03 ENCOUNTER — Encounter (HOSPITAL_COMMUNITY): Payer: Self-pay | Admitting: Emergency Medicine

## 2018-01-03 ENCOUNTER — Emergency Department (HOSPITAL_COMMUNITY)
Admission: EM | Admit: 2018-01-03 | Discharge: 2018-01-03 | Disposition: A | Discharge status: Home / Self Care | Payer: Medicaid Other | Attending: Emergency Medicine | Admitting: Emergency Medicine

## 2018-01-03 DIAGNOSIS — M2560 Stiffness of unspecified joint, not elsewhere classified: Secondary | ICD-10-CM | POA: Insufficient documentation

## 2018-01-03 DIAGNOSIS — F17210 Nicotine dependence, cigarettes, uncomplicated: Secondary | ICD-10-CM | POA: Diagnosis not present

## 2018-01-03 DIAGNOSIS — M25522 Pain in left elbow: Secondary | ICD-10-CM | POA: Insufficient documentation

## 2018-01-03 DIAGNOSIS — M79642 Pain in left hand: Secondary | ICD-10-CM | POA: Diagnosis present

## 2018-01-03 DIAGNOSIS — M5410 Radiculopathy, site unspecified: Secondary | ICD-10-CM | POA: Diagnosis not present

## 2018-01-03 DIAGNOSIS — J45909 Unspecified asthma, uncomplicated: Secondary | ICD-10-CM | POA: Diagnosis not present

## 2018-01-03 MED ORDER — NAPROXEN 375 MG PO TABS: 375.0000 mg | ORAL_TABLET | Freq: Two times a day (BID) | ORAL | 0 refills | Status: DC
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
## 2018-01-03 NOTE — ED Notes (Signed)
Discharge signature obtained but did not show up on discharge form. Pt stable and ambulatory upon discharge. Acknowledged understanding of paperwork.

## 2018-01-03 NOTE — ED Notes (Signed)
Patient acuity 4, see provider assessment.

## 2018-09-09 ENCOUNTER — Emergency Department (HOSPITAL_COMMUNITY): Payer: Medicaid Other

## 2018-09-09 ENCOUNTER — Encounter (HOSPITAL_COMMUNITY): Payer: Self-pay | Admitting: Emergency Medicine

## 2018-09-09 ENCOUNTER — Other Ambulatory Visit: Payer: Self-pay

## 2018-09-09 ENCOUNTER — Emergency Department (HOSPITAL_COMMUNITY)
Admission: EM | Admit: 2018-09-09 | Discharge: 2018-09-09 | Disposition: A | Discharge status: Home / Self Care | Payer: Medicaid Other | Attending: Emergency Medicine | Admitting: Emergency Medicine

## 2018-09-09 DIAGNOSIS — M25511 Pain in right shoulder: Secondary | ICD-10-CM | POA: Diagnosis not present

## 2018-09-09 DIAGNOSIS — F17210 Nicotine dependence, cigarettes, uncomplicated: Secondary | ICD-10-CM | POA: Insufficient documentation

## 2018-09-09 DIAGNOSIS — F909 Attention-deficit hyperactivity disorder, unspecified type: Secondary | ICD-10-CM | POA: Insufficient documentation

## 2018-09-09 DIAGNOSIS — Z79899 Other long term (current) drug therapy: Secondary | ICD-10-CM | POA: Insufficient documentation

## 2018-09-09 DIAGNOSIS — J45909 Unspecified asthma, uncomplicated: Secondary | ICD-10-CM | POA: Insufficient documentation

## 2018-09-09 MED ORDER — CYCLOBENZAPRINE HCL 10 MG PO TABS: 10.0000 mg | ORAL_TABLET | Freq: Two times a day (BID) | ORAL | 0 refills | Status: DC | PRN

## 2018-09-09 MED ORDER — ETODOLAC 300 MG PO CAPS: 300.0000 mg | ORAL_CAPSULE | Freq: Three times a day (TID) | ORAL | 0 refills | Status: DC

## 2018-09-09 NOTE — ED Notes (Signed)
Patient transported to X-ray 

## 2018-09-09 NOTE — ED Notes (Signed)
Bed: WA05 Expected date:  Expected time:  Means of arrival:  Comments: 

## 2018-09-09 NOTE — ED Triage Notes (Signed)
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 Reports has a knot. Ben having heat and OTC meds for it without relief.

## 2018-09-09 NOTE — ED Provider Notes (Signed)
[facility]-EMERGENCY DEPT Provider Note   CSN: [id number] Arrival date & time: 09/09/18  1102    History   Chief Complaint Chief Complaint  Patient presents with  . Shoulder Pain    HPI Jamie Palmer is a 33 y.o. female.     HPI Patient presents to the ED for evaluation of right shoulder pain.  Patient states his symptoms started a couple weeks ago.  She has tried over-the-counter medications as well as heat without any significant improvement.  She continues to have pain in her right shoulder which increases with any movement and palpation.  Patient does a lot of activity at work.  She works at a [employer] and is constantly using the right arm.  She denies any fevers or chills.  No recent falls.  She was trying to avoid coming to the hospital but because of the increasing pain she came to the ED today.  Past Medical History:  Diagnosis Date  . ADHD (attention deficit hyperactivity disorder)   . Anxiety   . Asthma    Uses inhaler prn  . Bradycardia 02/23/2015  . Depression   . Dizziness 06/10/2015  . Gestational diabetes    glyburide  . Shingles     Patient Active Problem List   Diagnosis Date Noted  . Dizziness 06/10/2015  . Bradycardia 02/23/2015  . Diabetes mellitus during pregnancy in third trimester 11/26/2014  . Gestational diabetes 03/29/2012  . SVD (spontaneous vaginal delivery) 03/29/2012    Past Surgical History:  Procedure Laterality Date  . APPENDECTOMY    . FOOT SURGERY    . TUBAL LIGATION Bilateral 11/27/2014   Procedure: POST PARTUM TUBAL LIGATION;  Surgeon: Lavina Hammanodd Meisinger, MD;  Location: [facility];  Service: Gynecology;  Laterality: Bilateral;     OB History    Gravida  3   Para  3   Term  3   Preterm  0   AB  0   Living  3     SAB  0   TAB  0   Ectopic  0   Multiple  0   Live Births  3            Home Medications    Prior to Admission medications   Medication Sig Start Date End Date Taking?  Authorizing Provider  albuterol (PROVENTIL HFA;VENTOLIN HFA) 108 (90 Base) MCG/ACT inhaler Inhale 2 puffs into the lungs every 6 (six) hours as needed for wheezing or shortness of breath. Patient not taking: Reported on 08/09/2016 06/22/16   Enid DerryWagner, Ashley, PA-C  albuterol (PROVENTIL HFA;VENTOLIN HFA) 108 [phone](90 Base) MCG/ACT inhaler 1-2 puffs every 6 hours while awake for the next 36 hours then prn cough/wheeze/sob Patient not taking: Reported on 09/18/2017 08/09/16   Deatra Canterxford, William J, FNP  cyclobenzaprine (FLEXERIL) 10 MG tablet Take 1 tablet (10 mg total) by mouth 2 (two) times daily as needed for muscle spasms. 09/09/18   Linwood DibblesKnapp, Cardin Nitschke, MD  etodolac (LODINE) 300 MG capsule Take 1 capsule (300 mg total) by mouth every 8 (eight) hours. 09/09/18   Linwood DibblesKnapp, Krithi Bray, MD  fludrocortisone (FLORINEF) 0.1 MG tablet Take 1 tablet (0.1 mg total) by mouth daily. Patient not taking: Reported on 01/26/2017 06/10/15   Chilton Siandolph, Tiffany, MD  ibuprofen (ADVIL,MOTRIN) 600 MG tablet Take 1 tablet (600 mg total) by mouth every 6 (six) hours as needed. 09/19/17   Dartha LodgeFord, Kelsey N, PA-C  ibuprofen (ADVIL,MOTRIN) 800 MG tablet Take 1 tablet (800 mg total) by mouth  every 8 (eight) hours as needed. Patient not taking: Reported on 09/18/2017 01/26/17   Emily Filbert, MD  methocarbamol (ROBAXIN) 500 MG tablet Take 1 tablet (500 mg total) by mouth 2 (two) times daily. 09/19/17   Dartha Lodge, PA-C  metroNIDAZOLE (FLAGYL) 500 MG tablet Take 1 tablet (500 mg total) by mouth 2 (two) times daily. Patient not taking: Reported on 09/18/2017 01/26/17   Emily Filbert, MD  metroNIDAZOLE (FLAGYL) 500 MG tablet Take 1 tablet (500 mg total) by mouth 2 (two) times daily. Patient not taking: Reported on 09/18/2017 07/13/17   Isa Rankin, MD  naproxen (NAPROSYN) 375 MG tablet Take 1 tablet (375 mg total) by mouth 2 (two) times daily. 01/03/18   Felicie Morn, NP  oxyCODONE-acetaminophen (PERCOCET) 5-325 MG tablet Take 1 tablet by mouth every 6 (six)  hours as needed. 09/19/17   Dartha Lodge, PA-C    Family History Family History  Problem Relation Age of Onset  . Diabetes Other   . Hypertension Other   . Heart disease Other   . Cancer Other   . COPD Other   . Asthma Other   . Heart attack Other   . Obesity Other   . Hyperlipidemia Other   . Cancer Mother        lung  . Cancer Maternal Grandmother        lung  . Cancer Father   . Heart attack Maternal Aunt   . Hypertension Maternal Aunt   . Diabetes Maternal Aunt   . Heart disease Maternal Aunt   . Cancer Maternal Grandfather        lung  . Other Neg Hx     Social History Social History   Tobacco Use  . Smoking status: Current Every Day Smoker    Packs/day: 1.00    Years: 10.00    Pack years: 10.00    Types: Cigarettes  . Smokeless tobacco: Never Used  Substance Use Topics  . Alcohol use: No    Alcohol/week: 0.0 standard drinks  . Drug use: No    Comment: previous drug addiction 7 years ago     Allergies   Patient has no known allergies.   Review of Systems Review of Systems  All other systems reviewed and are negative.    Physical Exam Updated Vital Signs BP 138/80 (BP Location: Left Arm)   Pulse 90   Temp 98.1 F (36.7 C) (Oral)   Resp 18   SpO2 100%   Physical Exam Vitals signs and nursing note reviewed.  Constitutional:      General: She is not in acute distress.    Appearance: She is well-developed.  HENT:     Head: Normocephalic and atraumatic.     Right Ear: External ear normal.     Left Ear: External ear normal.  Eyes:     General: No scleral icterus.       Right eye: No discharge.        Left eye: No discharge.     Conjunctiva/sclera: Conjunctivae normal.  Neck:     Musculoskeletal: Neck supple.     Trachea: No tracheal deviation.  Cardiovascular:     Rate and Rhythm: Normal rate.  Pulmonary:     Effort: Pulmonary effort is normal. No respiratory distress.     Breath sounds: No stridor.  Abdominal:     General: There  is no distension.  Musculoskeletal:        General: No swelling  or deformity.     Right shoulder: She exhibits decreased range of motion, tenderness, pain and spasm. She exhibits no swelling, no deformity, normal pulse and normal strength.  Skin:    General: Skin is warm and dry.     Findings: No rash.  Neurological:     Mental Status: She is alert.     Cranial Nerves: Cranial nerve deficit: no gross deficits.      ED Treatments / Results  Labs (all labs ordered are listed, but only abnormal results are displayed) Labs Reviewed - No data to display  EKG None  Radiology Dg Shoulder Right  Result Date: 09/09/2018 CLINICAL DATA:  Right shoulder pain 2 weeks. EXAM: RIGHT SHOULDER - 2+ VIEW COMPARISON:  None. FINDINGS: Mild degenerative change of the [facility] joint. No evidence of fracture dislocation. No significant degenerative change of the glenohumeral joint. IMPRESSION: No acute findings. Mild degenerative change of the [facility] joint. Electronically Signed   By: Elberta Fortis M.D.   On: 09/09/2018 12:42    Procedures Procedures (including critical care time)  Medications Ordered in ED Medications - No data to display   Initial Impression / Assessment and Plan / ED Course  I have reviewed the triage vital signs and the nursing notes.  Pertinent labs & imaging results that were available during my care of the patient were reviewed by me and considered in my medical decision making (see chart for details).   Patient presented to the ED with complaints of right shoulder pain.  No acute abnormalities noted on x-ray.  Symptoms are likely related to her repetitive use of her right arm.  Patient feels a lump in the right shoulder but I do not appreciate any mass.  I suspect it could be related to muscle spasm.  Plan on discharge home with anti-inflammatory medications and muscle relaxants.  Recommend outpatient follow-up with orthopedics for further evaluation.   Final Clinical Impressions(s)  / ED Diagnoses   Final diagnoses:  Acute pain of right shoulder    ED Discharge Orders         Ordered    etodolac (LODINE) 300 MG capsule  Every 8 hours    Note to Pharmacy:  As needed for pain   09/09/18 1258    cyclobenzaprine (FLEXERIL) 10 MG tablet  2 times daily PRN     09/09/18 1258           Linwood Dibbles, MD 09/09/18 1305

## 2018-11-17 ENCOUNTER — Other Ambulatory Visit: Payer: Self-pay

## 2018-11-17 ENCOUNTER — Emergency Department: Payer: 59

## 2018-11-17 ENCOUNTER — Emergency Department
Admission: EM | Admit: 2018-11-17 | Discharge: 2018-11-17 | Disposition: A | Discharge status: Home / Self Care | Payer: 59 | Attending: Emergency Medicine | Admitting: Emergency Medicine

## 2018-11-17 DIAGNOSIS — A749 Chlamydial infection, unspecified: Secondary | ICD-10-CM | POA: Diagnosis not present

## 2018-11-17 DIAGNOSIS — R109 Unspecified abdominal pain: Secondary | ICD-10-CM | POA: Diagnosis present

## 2018-11-17 LAB — CBC
HCT: 40.2 % (ref 36.0–46.0)
Hemoglobin: 13.8 g/dL (ref 12.0–15.0)
MCH: 31.8 pg (ref 26.0–34.0)
MCHC: 34.3 g/dL (ref 30.0–36.0)
MCV: 92.6 fL (ref 80.0–100.0)
Platelets: 288 10*3/uL (ref 150–400)
RBC: 4.34 MIL/uL (ref 3.87–5.11)
RDW: 12.6 % (ref 11.5–15.5)
WBC: 11.4 10*3/uL — ABNORMAL HIGH (ref 4.0–10.5)
nRBC: 0 % (ref 0.0–0.2)

## 2018-11-17 LAB — COMPREHENSIVE METABOLIC PANEL
ALT: 12 U/L (ref 0–44)
AST: 22 U/L (ref 15–41)
Albumin: 4 g/dL (ref 3.5–5.0)
Alkaline Phosphatase: 64 U/L (ref 38–126)
Anion gap: 9 (ref 5–15)
BUN: 14 mg/dL (ref 6–20)
CO2: 25 mmol/L (ref 22–32)
Calcium: 8.5 mg/dL — ABNORMAL LOW (ref 8.9–10.3)
Chloride: 103 mmol/L (ref 98–111)
Creatinine, Ser: 0.69 mg/dL (ref 0.44–1.00)
GFR calc Af Amer: >60 mL/min (ref >60)
GFR calc non Af Amer: >60 mL/min (ref >60)
Glucose, Bld: 256 mg/dL — ABNORMAL HIGH (ref 70–99)
Potassium: 3.7 mmol/L (ref 3.5–5.1)
Sodium: 137 mmol/L (ref 135–145)
Total Bilirubin: 0.4 mg/dL (ref 0.3–1.2)
Total Protein: 6.7 g/dL (ref 6.5–8.1)

## 2018-11-17 LAB — PREGNANCY, URINE: Preg Test, Ur: NEGATIVE

## 2018-11-17 LAB — URINALYSIS, COMPLETE (UACMP) WITH MICROSCOPIC
Bacteria, UA: NONE SEEN
Bilirubin Urine: NEGATIVE
Glucose, UA: NEGATIVE mg/dL
Hgb urine dipstick: NEGATIVE
Ketones, ur: NEGATIVE mg/dL
Leukocytes,Ua: NEGATIVE
Nitrite: NEGATIVE
Protein, ur: NEGATIVE mg/dL
Specific Gravity, Urine: 1.019 (ref 1.005–1.030)
pH: 6 (ref 5.0–8.0)

## 2018-11-17 LAB — WET PREP, GENITAL
Sperm: NONE SEEN
Trich, Wet Prep: NONE SEEN
Yeast Wet Prep HPF POC: NONE SEEN

## 2018-11-17 LAB — CHLAMYDIA/NGC RT PCR (ARMC ONLY): Chlamydia Tr: DETECTED — AB

## 2018-11-17 LAB — CHLAMYDIA/NGC RT PCR (ARMC ONLY)??????????: N gonorrhoeae: NOT DETECTED

## 2018-11-17 LAB — LIPASE, BLOOD: Lipase: 34 U/L (ref 11–51)

## 2018-11-17 MED ORDER — CEFTRIAXONE SODIUM 250 MG IJ SOLR
250.0000 mg | INTRAMUSCULAR | Status: DC
  Administered 2018-11-17: 250 mg via INTRAMUSCULAR
  Filled 2018-11-17: qty 250

## 2018-11-17 MED ORDER — IBUPROFEN 800 MG PO TABS: 800.0000 mg | ORAL_TABLET | Freq: Three times a day (TID) | ORAL | 0 refills | Status: DC | PRN

## 2018-11-17 MED ORDER — METRONIDAZOLE 500 MG PO TABS: 500.0000 mg | ORAL_TABLET | Freq: Two times a day (BID) | ORAL | 0 refills | Status: DC

## 2018-11-17 MED ORDER — LIDOCAINE HCL (PF) 1 % IJ SOLN
INTRAMUSCULAR | Status: AC
  Administered 2018-11-17: 1 mL via INTRAMUSCULAR
  Filled 2018-11-17: qty 5

## 2018-11-17 MED ORDER — AZITHROMYCIN 500 MG PO TABS
1000.0000 mg | ORAL_TABLET | Freq: Once | ORAL | Status: AC
  Administered 2018-11-17: 17:00:00 1000 mg via ORAL
  Filled 2018-11-17: qty 2

## 2018-11-17 MED ORDER — KETOROLAC TROMETHAMINE 30 MG/ML IJ SOLN
30.0000 mg | Freq: Once | INTRAMUSCULAR | Status: AC
  Administered 2018-11-17: 30 mg via INTRAVENOUS
  Filled 2018-11-17: qty 1

## 2018-11-17 NOTE — ED Notes (Signed)
Pt back to room and getting undressed

## 2018-11-17 NOTE — ED Notes (Signed)
Pt stated she is going to the front door to get signal on her phone and will be back to room

## 2018-11-17 NOTE — ED Provider Notes (Signed)
Patient was seen and examined by me, no acute distress.  Positive for chlamydia.  She was given Rocephin and Zithromax.   Earleen Newport, MD 11/17/18 [id number]

## 2018-11-17 NOTE — ED Notes (Signed)
Pt states pain started 3 days ago- states she has a history of ovarian cysts and that this pain feels similar- no fever, no vomiting, no diarrhea

## 2018-11-17 NOTE — ED Notes (Signed)
Went to discharge pt and pt not in room- calling pt to have her come and pick up her prescription from front desk

## 2018-11-17 NOTE — ED Triage Notes (Signed)
Pt in from home d/t LLQ abdominal/pelvic pain; denies burning upon urination; pt states BM normal; denies N/V/fever/cough/SOB/CP; pt states chills and sweats; denies HA; pt states that nothing helps the pain.

## 2019-02-21 ENCOUNTER — Emergency Department (HOSPITAL_COMMUNITY): Payer: 59

## 2019-02-21 ENCOUNTER — Emergency Department (HOSPITAL_COMMUNITY)
Admission: EM | Admit: 2019-02-21 | Discharge: 2019-02-21 | Disposition: A | Discharge status: Home / Self Care | Payer: 59 | Attending: Emergency Medicine | Admitting: Emergency Medicine

## 2019-02-21 ENCOUNTER — Encounter (HOSPITAL_COMMUNITY): Payer: Self-pay

## 2019-02-21 ENCOUNTER — Other Ambulatory Visit: Payer: Self-pay

## 2019-02-21 DIAGNOSIS — J40 Bronchitis, not specified as acute or chronic: Secondary | ICD-10-CM | POA: Insufficient documentation

## 2019-02-21 DIAGNOSIS — R05 Cough: Secondary | ICD-10-CM | POA: Diagnosis present

## 2019-02-21 DIAGNOSIS — F17210 Nicotine dependence, cigarettes, uncomplicated: Secondary | ICD-10-CM | POA: Insufficient documentation

## 2019-02-21 DIAGNOSIS — J019 Acute sinusitis, unspecified: Secondary | ICD-10-CM | POA: Diagnosis not present

## 2019-02-21 DIAGNOSIS — Z20828 Contact with and (suspected) exposure to other viral communicable diseases: Secondary | ICD-10-CM | POA: Insufficient documentation

## 2019-02-21 LAB — CBC WITH DIFFERENTIAL/PLATELET
Abs Immature Granulocytes: 0.02 10*3/uL (ref 0.00–0.07)
Basophils Absolute: 0 10*3/uL (ref 0.0–0.1)
Basophils Relative: 0 %
Eosinophils Absolute: 0.1 10*3/uL (ref 0.0–0.5)
Eosinophils Relative: 1 %
HCT: 43.9 % (ref 36.0–46.0)
Hemoglobin: 14.6 g/dL (ref 12.0–15.0)
Immature Granulocytes: 0 %
Lymphocytes Relative: 27 %
Lymphs Abs: 2.4 10*3/uL (ref 0.7–4.0)
MCH: 32 pg (ref 26.0–34.0)
MCHC: 33.3 g/dL (ref 30.0–36.0)
MCV: 96.3 fL (ref 80.0–100.0)
Monocytes Absolute: 0.6 10*3/uL (ref 0.1–1.0)
Monocytes Relative: 7 %
Neutro Abs: 5.7 10*3/uL (ref 1.7–7.7)
Neutrophils Relative %: 65 %
Platelets: 274 10*3/uL (ref 150–400)
RBC: 4.56 MIL/uL (ref 3.87–5.11)
RDW: 12.4 % (ref 11.5–15.5)
WBC: 8.8 10*3/uL (ref 4.0–10.5)
nRBC: 0 % (ref 0.0–0.2)

## 2019-02-21 LAB — BASIC METABOLIC PANEL
Anion gap: 10 (ref 5–15)
BUN: 11 mg/dL (ref 6–20)
CO2: 21 mmol/L — ABNORMAL LOW (ref 22–32)
Calcium: 8.3 mg/dL — ABNORMAL LOW (ref 8.9–10.3)
Chloride: 104 mmol/L (ref 98–111)
Creatinine, Ser: 0.75 mg/dL (ref 0.44–1.00)
GFR calc Af Amer: >60 mL/min (ref >60)
GFR calc non Af Amer: >60 mL/min (ref >60)
Glucose, Bld: 166 mg/dL — ABNORMAL HIGH (ref 70–99)
Potassium: 3.7 mmol/L (ref 3.5–5.1)
Sodium: 135 mmol/L (ref 135–145)

## 2019-02-21 LAB — SARS CORONAVIRUS 2 BY RT PCR (HOSPITAL ORDER, PERFORMED IN ~~LOC~~ HOSPITAL LAB): SARS Coronavirus 2: NEGATIVE

## 2019-02-21 MED ORDER — ALBUTEROL SULFATE HFA 108 (90 BASE) MCG/ACT IN AERS: 1.0000 | INHALATION_SPRAY | Freq: Four times a day (QID) | RESPIRATORY_TRACT | 0 refills | Status: DC | PRN

## 2019-02-21 MED ORDER — AEROCHAMBER Z-STAT PLUS/MEDIUM MISC
1.0000 | Freq: Once | Status: AC
  Administered 2019-02-21: 1
  Filled 2019-02-21: qty 1

## 2019-02-21 MED ORDER — PREDNISONE 20 MG PO TABS
60.0000 mg | ORAL_TABLET | Freq: Once | ORAL | Status: AC
  Administered 2019-02-21: 60 mg via ORAL
  Filled 2019-02-21: qty 3

## 2019-02-21 MED ORDER — AMOXICILLIN-POT CLAVULANATE 875-125 MG PO TABS: 1.0000 | ORAL_TABLET | Freq: Two times a day (BID) | ORAL | 0 refills | Status: DC

## 2019-02-21 MED ORDER — IPRATROPIUM BROMIDE HFA 17 MCG/ACT IN AERS
2.0000 | INHALATION_SPRAY | Freq: Once | RESPIRATORY_TRACT | Status: AC
  Administered 2019-02-21: 2 via RESPIRATORY_TRACT
  Filled 2019-02-21: qty 12.9

## 2019-02-21 MED ORDER — PREDNISONE 20 MG PO TABS: 40.0000 mg | ORAL_TABLET | Freq: Every day | ORAL | 0 refills | Status: DC

## 2019-02-21 MED ORDER — ALBUTEROL SULFATE HFA 108 (90 BASE) MCG/ACT IN AERS
8.0000 | INHALATION_SPRAY | Freq: Once | RESPIRATORY_TRACT | Status: AC
  Administered 2019-02-21: 8 via RESPIRATORY_TRACT

## 2019-02-21 MED ORDER — ALBUTEROL SULFATE HFA 108 (90 BASE) MCG/ACT IN AERS
8.0000 | INHALATION_SPRAY | Freq: Once | RESPIRATORY_TRACT | Status: AC
  Administered 2019-02-21: 8 via RESPIRATORY_TRACT
  Filled 2019-02-21: qty 6.7

## 2019-02-21 MED ORDER — IPRATROPIUM BROMIDE HFA 17 MCG/ACT IN AERS
2.0000 | INHALATION_SPRAY | Freq: Once | RESPIRATORY_TRACT | Status: AC
  Administered 2019-02-21: 2 via RESPIRATORY_TRACT

## 2019-02-21 NOTE — ED Provider Notes (Signed)
[facility]   CSN: [id number] Arrival date & time: 02/21/19  1442     History   Chief Complaint Chief Complaint  Patient presents with  . Wheezing  . Cough  . Nasal Congestion    HPI Jamie Palmer is a 33 y.o. female who presents with cough and SOB. PMH significant for asthma, hx of bronchitis and pneumonia, tobacco use (1 pack a day). She states that for about a week and a half she has had gradually worsening SOB. It started when her sinuses started draining and now she is coughing frequently, SOB, wheezing. The cough is productive of white sputum. She denies fevers but has had chills. She works in a [employer] and is exposed to cold and damp air. She has not been able to work in a week and so she called her PCP for a televisit and they advised her to come to the ED. She has been using Nyquil, Mucinex, albuterol inhaler, and her friend's nebulizer without relief. She denies sneezing, anosmia, sore throat, chest pain, abdominal pain.      HPI  Past Medical History:  Diagnosis Date  . ADHD (attention deficit hyperactivity disorder)   . Anxiety   . Asthma    Uses inhaler prn  . Bradycardia 02/23/2015  . Depression   . Dizziness 06/10/2015  . Gestational diabetes    glyburide  . Shingles     Patient Active Problem List   Diagnosis Date Noted  . Dizziness 06/10/2015  . Bradycardia 02/23/2015  . Diabetes mellitus during pregnancy in third trimester 11/26/2014  . Gestational diabetes 03/29/2012  . SVD (spontaneous vaginal delivery) 03/29/2012    Past Surgical History:  Procedure Laterality Date  . APPENDECTOMY    . FOOT SURGERY    . TUBAL LIGATION Bilateral 11/27/2014   Procedure: POST PARTUM TUBAL LIGATION;  Surgeon: Lavina Hamman, MD;  Location: [facility];  Service: Gynecology;  Laterality: Bilateral;     OB History    Gravida  3   Para  3   Term  3   Preterm  0   AB  0   Living  3     SAB  0   TAB   0   Ectopic  0   Multiple  0   Live Births  3            Home Medications    Prior to Admission medications   Medication Sig Start Date End Date Taking? Authorizing Provider  albuterol (PROVENTIL HFA;VENTOLIN HFA) 108 (90 Base) MCG/ACT inhaler Inhale 2 puffs into the lungs every 6 (six) hours as needed for wheezing or shortness of breath. Patient not taking: Reported on 08/09/2016 06/22/16   Enid Derry, PA-C  albuterol (PROVENTIL HFA;VENTOLIN HFA) 108 [phone] Base) MCG/ACT inhaler 1-2 puffs every 6 hours while awake for the next 36 hours then prn cough/wheeze/sob Patient not taking: Reported on 09/18/2017 08/09/16   Deatra Canter, FNP  cyclobenzaprine (FLEXERIL) 10 MG tablet Take 1 tablet (10 mg total) by mouth 2 (two) times daily as needed for muscle spasms. 09/09/18   Linwood Dibbles, MD  etodolac (LODINE) 300 MG capsule Take 1 capsule (300 mg total) by mouth every 8 (eight) hours. 09/09/18   Linwood Dibbles, MD  fludrocortisone (FLORINEF) 0.1 MG tablet Take 1 tablet (0.1 mg total) by mouth daily. Patient not taking: Reported on 01/26/2017 06/10/15   Chilton Si, MD  ibuprofen (ADVIL) 800 MG tablet Take 1 tablet (  800 mg total) by mouth every 8 (eight) hours as needed. 11/17/18   Emily FilbertWilliams, Jonathan E, MD  ibuprofen (ADVIL,MOTRIN) 600 MG tablet Take 1 tablet (600 mg total) by mouth every 6 (six) hours as needed. 09/19/17   Dartha LodgeFord, Kelsey N, PA-C  ibuprofen (ADVIL,MOTRIN) 800 MG tablet Take 1 tablet (800 mg total) by mouth every 8 (eight) hours as needed. Patient not taking: Reported on 09/18/2017 01/26/17   Emily FilbertWilliams, Jonathan E, MD  methocarbamol (ROBAXIN) 500 MG tablet Take 1 tablet (500 mg total) by mouth 2 (two) times daily. 09/19/17   Dartha LodgeFord, Kelsey N, PA-C  metroNIDAZOLE (FLAGYL) 500 MG tablet Take 1 tablet (500 mg total) by mouth 2 (two) times daily. Patient not taking: Reported on 09/18/2017 01/26/17   Emily FilbertWilliams, Jonathan E, MD  metroNIDAZOLE (FLAGYL) 500 MG tablet Take 1 tablet (500 mg total) by mouth  2 (two) times daily. Patient not taking: Reported on 09/18/2017 07/13/17   Isa RankinMurray, Laura Wilson, MD  metroNIDAZOLE (FLAGYL) 500 MG tablet Take 1 tablet (500 mg total) by mouth 2 (two) times daily. 11/17/18   Emily FilbertWilliams, Jonathan E, MD  naproxen (NAPROSYN) 375 MG tablet Take 1 tablet (375 mg total) by mouth 2 (two) times daily. 01/03/18   Felicie MornSmith, David, NP  oxyCODONE-acetaminophen (PERCOCET) 5-325 MG tablet Take 1 tablet by mouth every 6 (six) hours as needed. 09/19/17   Dartha LodgeFord, Kelsey N, PA-C    Family History Family History  Problem Relation Age of Onset  . Diabetes Other   . Hypertension Other   . Heart disease Other   . Cancer Other   . COPD Other   . Asthma Other   . Heart attack Other   . Obesity Other   . Hyperlipidemia Other   . Cancer Mother        lung  . Cancer Maternal Grandmother        lung  . Cancer Father   . Heart attack Maternal Aunt   . Hypertension Maternal Aunt   . Diabetes Maternal Aunt   . Heart disease Maternal Aunt   . Cancer Maternal Grandfather        lung  . Other Neg Hx     Social History Social History   Tobacco Use  . Smoking status: Current Every Day Smoker    Packs/day: 1.00    Years: 10.00    Pack years: 10.00    Types: Cigarettes  . Smokeless tobacco: Never Used  Substance Use Topics  . Alcohol use: Yes    Alcohol/week: 0.0 standard drinks  . Drug use: No    Comment: previous drug addiction 7 years ago     Allergies   Patient has no known allergies.   Review of Systems Review of Systems  Constitutional: Positive for chills and fatigue. Negative for fever.  HENT: Positive for congestion and sinus pressure.   Respiratory: Positive for cough, shortness of breath and wheezing.   Cardiovascular: Negative for chest pain.  Gastrointestinal: Negative for abdominal pain.  All other systems reviewed and are negative.    Physical Exam Updated Vital Signs BP 136/90   Pulse 76   Temp 98.5 F (36.9 C) (Oral)   Resp 16   Ht 5\' 7"  (1.702 m)    Wt 90.7 kg   LMP 02/07/2019   SpO2 97%   BMI 31.32 kg/m   Physical Exam Vitals signs and nursing note reviewed.  Constitutional:      General: She is not in acute distress.  Appearance: She is well-developed. She is obese. She is not ill-appearing.     Comments: Cooperative. Mild distress from coughing and SOB  HENT:     Head: Normocephalic and atraumatic.     Right Ear: Tympanic membrane normal.     Left Ear: Tympanic membrane normal.     Nose: Nose normal.     Mouth/Throat:     Mouth: Mucous membranes are moist.  Eyes:     General: No scleral icterus.       Right eye: No discharge.        Left eye: No discharge.     Conjunctiva/sclera: Conjunctivae normal.     Pupils: Pupils are equal, round, and reactive to light.  Neck:     Musculoskeletal: Normal range of motion.  Cardiovascular:     Rate and Rhythm: Normal rate and regular rhythm.  Pulmonary:     Effort: Pulmonary effort is normal. Tachypnea present. No respiratory distress.     Breath sounds: Wheezing and rhonchi present.  Abdominal:     General: There is no distension.     Palpations: Abdomen is soft.     Tenderness: There is no abdominal tenderness.  Musculoskeletal:     Right lower leg: No edema.     Left lower leg: No edema.  Skin:    General: Skin is warm and dry.  Neurological:     Mental Status: She is alert and oriented to person, place, and time.  Psychiatric:        Behavior: Behavior normal.      ED Treatments / Results  Labs (all labs ordered are listed, but only abnormal results are displayed) Labs Reviewed  BASIC METABOLIC PANEL - Abnormal; Notable for the following components:      Result Value   CO2 21 (*)    Glucose, Bld 166 (*)    Calcium 8.3 (*)    All other components within normal limits  SARS CORONAVIRUS 2 BY RT PCR (HOSPITAL ORDER, [place] [facility])  CBC WITH DIFFERENTIAL/PLATELET    EKG None  Radiology Dg Chest 2 View  Result Date: 02/21/2019  CLINICAL DATA:  Productive cough x 1 1/2 weeks. Pt also has shortness of breath. EXAM: CHEST - 2 VIEW COMPARISON:  Chest radiograph 08/14/2016 FINDINGS: The heart size and mediastinal contours are within normal limits. The lungs are clear. No pneumothorax or pleural effusion. The visualized skeletal structures are unremarkable. IMPRESSION: No evidence of active disease. Electronically Signed   By: Audie Pinto M.D.   On: 02/21/2019 16:08    Procedures Procedures (including critical care time)  Medications Ordered in ED Medications  predniSONE (DELTASONE) tablet 60 mg (60 mg Oral Given 02/21/19 1607)  albuterol (VENTOLIN HFA) 108 (90 Base) MCG/ACT inhaler 8 puff (8 puffs Inhalation Given 02/21/19 1610)  aerochamber Z-Stat Plus/medium 1 each (1 each Other Given 02/21/19 1629)  ipratropium (ATROVENT HFA) inhaler 2 puff (2 puffs Inhalation Given 02/21/19 1611)  albuterol (VENTOLIN HFA) 108 (90 Base) MCG/ACT inhaler 8 puff (8 puffs Inhalation Given 02/21/19 1815)  ipratropium (ATROVENT HFA) inhaler 2 puff (2 puffs Inhalation Given 02/21/19 1815)     Initial Impression / Assessment and Plan / ED Course  I have reviewed the triage vital signs and the nursing notes.  Pertinent labs & imaging results that were available during my care of the patient were reviewed by me and considered in my medical decision making (see chart for details).  33 year old female presents with 2 weeks  of sinus pressure, congestion, shortness of breath, cough and wheezing.  Symptoms are worsening despite home use of albuterol and over-the-counter cough and cold medicines.  Her vital signs are normal here.  Her exam is consistent with sinusitis/bronchitis.  She has diffuse rhonchi and wheezing on exam.  Will initiate albuterol and Atrovent therapy and give dose of prednisone.  Will check blood work, chest x-ray, COVID as there have been multiple sick contacts at work and she may require admission.  CBC is normal.  CMP is  remarkable for hyperglycemia.  She states that she does have prediabetes.  Chest x-ray is negative.  After 2 breathing treatments she feels improved.  Ambulatory sats were normal.  At this time I think she will benefit from a steroid burst despite her pre-diabetes. Will also rx Augmentin for >1 week of sinus pressure/congestion. Work note given. Return precautions discussed.  Final Clinical Impressions(s) / ED Diagnoses   Final diagnoses:  Acute non-recurrent sinusitis, unspecified location  Bronchitis    ED Discharge Orders         Ordered    amoxicillin-clavulanate (AUGMENTIN) 875-125 MG tablet  Every 12 hours     02/21/19 1858    predniSONE (DELTASONE) 20 MG tablet  Daily     02/21/19 1858    albuterol (VENTOLIN HFA) 108 (90 Base) MCG/ACT inhaler  Every 6 hours PRN     02/21/19 1858           Bethel Born, PA-C 02/22/19 2227    Gerhard Munch, MD 02/23/19 2024

## 2019-02-21 NOTE — Discharge Instructions (Signed)
Continue to use inhaler as needed for shortness of breath and wheezing Take Prednisone for the next 5 days Start Augmentin for sinus infection Please follow up with your doctor and return if you are worsening

## 2019-02-21 NOTE — ED Triage Notes (Signed)
Patient c/o a productive cough with white to green sputum and nasal congestion x 1 1/2 weeks. Patient states she went to her PCP and was told she needed to come to the ED for a neb treatment.

## 2019-08-18 ENCOUNTER — Ambulatory Visit (HOSPITAL_COMMUNITY)
Admission: EM | Admit: 2019-08-18 | Discharge: 2019-08-18 | Disposition: A | Discharge status: Home / Self Care | Payer: Medicaid Other | Attending: Physician Assistant | Admitting: Physician Assistant

## 2019-08-18 ENCOUNTER — Other Ambulatory Visit: Payer: Self-pay

## 2019-08-18 ENCOUNTER — Encounter (HOSPITAL_COMMUNITY): Payer: Self-pay | Admitting: Emergency Medicine

## 2019-08-18 DIAGNOSIS — L739 Follicular disorder, unspecified: Secondary | ICD-10-CM

## 2019-08-18 DIAGNOSIS — L219 Seborrheic dermatitis, unspecified: Secondary | ICD-10-CM | POA: Diagnosis not present

## 2019-08-18 MED ORDER — KETOCONAZOLE 2 % EX SHAM: 1.0000 "application " | MEDICATED_SHAMPOO | CUTANEOUS | 0 refills | Status: DC

## 2019-08-18 MED ORDER — DOXYCYCLINE HYCLATE 100 MG PO CAPS: 100.0000 mg | ORAL_CAPSULE | Freq: Two times a day (BID) | ORAL | 0 refills | Status: DC

## 2019-08-18 MED ORDER — FLUCONAZOLE 150 MG PO TABS: 150.0000 mg | ORAL_TABLET | Freq: Every day | ORAL | 0 refills | Status: DC

## 2019-08-18 NOTE — Discharge Instructions (Signed)
Use the shampoo twice a week until you have an resolution of symptoms. Take the doxycycline twice a day for 10 days  Please follow-up with primary care if you continue to have symptoms.

## 2019-08-18 NOTE — ED Provider Notes (Signed)
[facility]    CSN: [id number] Arrival date & time: 08/18/19  1646      History   Chief Complaint Chief Complaint  Patient presents with  . Rash  . Lymphadenopathy    HPI Jamie Palmer is a 34 y.o. female.   Patient presents for evaluation of itchy scalp as well as possible scalp infection.  She reports for 1 month she has had itchy scalp and has developed infections in her scalp due to this.  She reports she was seen on 07/15/2019 by her primary care for the same complaint placed on a a steroid cream for seborrheic dermatitis of her scalp and given antibiotic to treat cellulitis folliculitis..  She reports she used the steroid for 1 week however could not tolerate how greasy and oily her hair was.  She did report some improvement in her itching however it was still somewhat itchy.  She took Keflex for about 10 days and had resolution of the bumps and infection in her scalp.  She reports after stopping these she had continued itching and developed new infection bumps with swollen lymph nodes in the back of her neck.  She has not had significant hair loss throughout the duration of this.  She denies fever and chills.  She reports she stopped wearing hard hat every day but did this for over a year and did not have an issue prior.     Past Medical History:  Diagnosis Date  . ADHD (attention deficit hyperactivity disorder)   . Anxiety   . Asthma    Uses inhaler prn  . Bradycardia 02/23/2015  . Depression   . Dizziness 06/10/2015  . Gestational diabetes    glyburide  . Shingles     Patient Active Problem List   Diagnosis Date Noted  . Dizziness 06/10/2015  . Bradycardia 02/23/2015  . Diabetes mellitus during pregnancy in third trimester 11/26/2014  . Gestational diabetes 03/29/2012  . SVD (spontaneous vaginal delivery) 03/29/2012    Past Surgical History:  Procedure Laterality Date  . APPENDECTOMY    . FOOT SURGERY    . TUBAL LIGATION Bilateral 11/27/2014   Procedure: POST PARTUM TUBAL LIGATION;  Surgeon: Cheri Fowler, MD;  Location: [facility];  Service: Gynecology;  Laterality: Bilateral;    OB History    Gravida  3   Para  3   Term  3   Preterm  0   AB  0   Living  3     SAB  0   TAB  0   Ectopic  0   Multiple  0   Live Births  3            Home Medications    Prior to Admission medications   Medication Sig Start Date End Date Taking? Authorizing Provider  albuterol (PROVENTIL HFA;VENTOLIN HFA) 108 (90 Base) MCG/ACT inhaler Inhale 2 puffs into the lungs every 6 (six) hours as needed for wheezing or shortness of breath. 06/22/16   Laban Emperor, PA-C  albuterol (VENTOLIN HFA) 108 (90 Base) MCG/ACT inhaler Inhale 1-2 puffs into the lungs every 6 (six) hours as needed for wheezing or shortness of breath. 02/21/19   Recardo Evangelist, PA-C  amoxicillin-clavulanate (AUGMENTIN) 875-125 MG tablet Take 1 tablet by mouth every 12 (twelve) hours. Patient not taking: Reported on 08/18/2019 02/21/19   Recardo Evangelist, PA-C  doxycycline (VIBRAMYCIN) 100 MG capsule Take 1 capsule (100 mg total) by mouth 2 (two) times daily. 08/18/19  Neiko Trivedi, Veryl Speak, PA-C  fluconazole (DIFLUCAN) 150 MG tablet Take 1 tablet (150 mg total) by mouth daily. 08/18/19   Lillia Lengel, Veryl Speak, PA-C  ketoconazole (NIZORAL) 2 % shampoo Apply 1 application topically 2 (two) times a week. 08/18/19   Armany Mano, Veryl Speak, PA-C  predniSONE (DELTASONE) 20 MG tablet Take 2 tablets (40 mg total) by mouth daily. Patient not taking: Reported on 08/18/2019 02/21/19   Bethel Born, PA-C    Family History Family History  Problem Relation Age of Onset  . Diabetes Other   . Hypertension Other   . Heart disease Other   . Cancer Other   . COPD Other   . Asthma Other   . Heart attack Other   . Obesity Other   . Hyperlipidemia Other   . Cancer Mother        lung  . Cancer Maternal Grandmother        lung  . Cancer Father   . Heart attack Maternal Aunt   . Hypertension Maternal  Aunt   . Diabetes Maternal Aunt   . Heart disease Maternal Aunt   . Cancer Maternal Grandfather        lung  . Other Neg Hx     Social History Social History   Tobacco Use  . Smoking status: Current Every Day Smoker    Packs/day: 1.00    Years: 10.00    Pack years: 10.00    Types: Cigarettes  . Smokeless tobacco: Never Used  Substance Use Topics  . Alcohol use: Yes    Alcohol/week: 0.0 standard drinks  . Drug use: No    Comment: previous drug addiction 7 years ago     Allergies   Patient has no known allergies.   Review of Systems Review of Systems  See HPI Physical Exam Triage Vital Signs ED Triage Vitals  Enc Vitals Group     BP 08/18/19 1732 (!) 150/91     Pulse Rate 08/18/19 1732 79     Resp 08/18/19 1732 18     Temp 08/18/19 1732 98.2 F (36.8 C)     Temp Source 08/18/19 1732 Oral     SpO2 08/18/19 1732 99 %     Weight --      Height --      Head Circumference --      Peak Flow --      Pain Score 08/18/19 1733 8     Pain Loc --      Pain Edu? --      Excl. in GC? --    No data found.  Updated Vital Signs BP (!) 150/91 (BP Location: Right Arm)   Pulse 79   Temp 98.2 F (36.8 C) (Oral)   Resp 18   SpO2 99%   Visual Acuity Right Eye Distance:   Left Eye Distance:   Bilateral Distance:    Right Eye Near:   Left Eye Near:    Bilateral Near:     Physical Exam Constitutional:      General: She is not in acute distress.    Appearance: Normal appearance. She is not ill-appearing.  HENT:     Head: Normocephalic and atraumatic.  Lymphadenopathy:     Cervical: Cervical adenopathy present.  Skin:    General: Skin is warm and dry.     Capillary Refill: Capillary refill takes less than 2 seconds.     Comments: There are 2-3 folliculitis lesions on the back of her head.  There is fine dandruff/scale however no significant erythema or skin scaling throughout her scalp.  There is no hair loss.  No evidence of parasitic infestation.  There is  some occipital lymphadenopathy as well as posterior cervical lymphadenopathy  Neurological:     General: No focal deficit present.     Mental Status: She is alert and oriented to person, place, and time.      UC Treatments / Results  Labs (all labs ordered are listed, but only abnormal results are displayed) Labs Reviewed - No data to display  EKG   Radiology No results found.  Procedures Procedures (including critical care time)  Medications Ordered in UC Medications - No data to display  Initial Impression / Assessment and Plan / UC Course  I have reviewed the triage vital signs and the nursing notes.  Pertinent labs & imaging results that were available during my care of the patient were reviewed by me and considered in my medical decision making (see chart for details).     #Scalp dermatitis #Folliculitis Patient 34 year old presenting with chronic scalp itching with secondary infections.  Per chart review patient was placed on Fluocinolone Acetonide Scalp 0.01 % oil for seborrheic dermatitis.  Given she not tolerate this well and there is no strong indication of inflammatory process at this time will start on Nizoral/ketoconazole shampoo twice a week until symptom resolution.  Will start on doxycycline given recent Keflex usage and new onset infection.  New primary care option given as patient requested this.  Instructed to follow-up with his primary care if continued to have issues will return to clinic if no resolution.  Instructed to minimize the amount of scratching in order to prevent secondary infections.  Patient verbalized understanding.  Final Clinical Impressions(s) / UC Diagnoses   Final diagnoses:  Folliculitis  Seborrheic dermatitis of scalp     Discharge Instructions     Use the shampoo twice a week until you have an resolution of symptoms. Take the doxycycline twice a day for 10 days  Please follow-up with primary care if you continue to have  symptoms.    ED Prescriptions    Medication Sig Dispense Auth. Provider   ketoconazole (NIZORAL) 2 % shampoo Apply 1 application topically 2 (two) times a week. 120 mL Jonesha Tsuchiya, Veryl Speak, PA-C   doxycycline (VIBRAMYCIN) 100 MG capsule Take 1 capsule (100 mg total) by mouth 2 (two) times daily. 20 capsule Dmonte Maher, Veryl Speak, PA-C   fluconazole (DIFLUCAN) 150 MG tablet Take 1 tablet (150 mg total) by mouth daily. 1 tablet Bassel Gaskill, Veryl Speak, PA-C     PDMP not reviewed this encounter.   Hermelinda Medicus, PA-C 08/18/19 [phone]

## 2019-08-18 NOTE — ED Triage Notes (Signed)
Pt sts some swollen areas in the back of her head that may be her lymph nodes; pt sts itching and painful

## 2019-10-04 ENCOUNTER — Encounter (HOSPITAL_COMMUNITY): Payer: Self-pay

## 2019-10-04 ENCOUNTER — Emergency Department (HOSPITAL_COMMUNITY)
Admission: EM | Admit: 2019-10-04 | Discharge: 2019-10-04 | Disposition: A | Discharge status: Home / Self Care | Payer: 59 | Attending: Emergency Medicine | Admitting: Emergency Medicine

## 2019-10-04 ENCOUNTER — Emergency Department (HOSPITAL_COMMUNITY): Payer: 59

## 2019-10-04 ENCOUNTER — Other Ambulatory Visit: Payer: Self-pay

## 2019-10-04 DIAGNOSIS — J45909 Unspecified asthma, uncomplicated: Secondary | ICD-10-CM | POA: Diagnosis not present

## 2019-10-04 DIAGNOSIS — F17210 Nicotine dependence, cigarettes, uncomplicated: Secondary | ICD-10-CM | POA: Insufficient documentation

## 2019-10-04 DIAGNOSIS — K2900 Acute gastritis without bleeding: Secondary | ICD-10-CM | POA: Insufficient documentation

## 2019-10-04 DIAGNOSIS — Z79899 Other long term (current) drug therapy: Secondary | ICD-10-CM | POA: Insufficient documentation

## 2019-10-04 DIAGNOSIS — F909 Attention-deficit hyperactivity disorder, unspecified type: Secondary | ICD-10-CM | POA: Diagnosis not present

## 2019-10-04 DIAGNOSIS — R112 Nausea with vomiting, unspecified: Secondary | ICD-10-CM | POA: Diagnosis present

## 2019-10-04 LAB — COMPREHENSIVE METABOLIC PANEL
ALT: 9 U/L (ref 0–44)
AST: 15 U/L (ref 15–41)
Albumin: 3.8 g/dL (ref 3.5–5.0)
Alkaline Phosphatase: 67 U/L (ref 38–126)
Anion gap: 8 (ref 5–15)
BUN: 16 mg/dL (ref 6–20)
CO2: 25 mmol/L (ref 22–32)
Calcium: 8.3 mg/dL — ABNORMAL LOW (ref 8.9–10.3)
Chloride: 102 mmol/L (ref 98–111)
Creatinine, Ser: 0.58 mg/dL (ref 0.44–1.00)
GFR calc Af Amer: >60 mL/min (ref >60)
GFR calc non Af Amer: >60 mL/min (ref >60)
Glucose, Bld: 245 mg/dL — ABNORMAL HIGH (ref 70–99)
Potassium: 3.8 mmol/L (ref 3.5–5.1)
Sodium: 135 mmol/L (ref 135–145)
Total Bilirubin: 0.5 mg/dL (ref 0.3–1.2)
Total Protein: 6.8 g/dL (ref 6.5–8.1)

## 2019-10-04 LAB — CBC
HCT: 42.6 % (ref 36.0–46.0)
Hemoglobin: 14.4 g/dL (ref 12.0–15.0)
MCH: 32.2 pg (ref 26.0–34.0)
MCHC: 33.8 g/dL (ref 30.0–36.0)
MCV: 95.3 fL (ref 80.0–100.0)
Platelets: 299 10*3/uL (ref 150–400)
RBC: 4.47 MIL/uL (ref 3.87–5.11)
RDW: 12.6 % (ref 11.5–15.5)
WBC: 12.5 10*3/uL — ABNORMAL HIGH (ref 4.0–10.5)
nRBC: 0 % (ref 0.0–0.2)

## 2019-10-04 LAB — LIPASE, BLOOD: Lipase: 37 U/L (ref 11–51)

## 2019-10-04 MED ORDER — FAMOTIDINE IN NACL 20-0.9 MG/50ML-% IV SOLN
20.0000 mg | Freq: Once | INTRAVENOUS | Status: AC
  Administered 2019-10-04: 20 mg via INTRAVENOUS
  Filled 2019-10-04: qty 50

## 2019-10-04 MED ORDER — ONDANSETRON HCL 4 MG/2ML IJ SOLN
4.0000 mg | Freq: Once | INTRAMUSCULAR | Status: AC
  Administered 2019-10-04: 4 mg via INTRAVENOUS
  Filled 2019-10-04: qty 2

## 2019-10-04 MED ORDER — SODIUM CHLORIDE 0.9% FLUSH: 3.0000 mL | Freq: Once | INTRAVENOUS | Status: DC

## 2019-10-04 MED ORDER — SODIUM CHLORIDE 0.9 % IV BOLUS
1000.0000 mL | Freq: Once | INTRAVENOUS | Status: AC
  Administered 2019-10-04: 1000 mL via INTRAVENOUS

## 2019-10-04 MED ORDER — SUCRALFATE 1 G PO TABS: 1.0000 g | ORAL_TABLET | Freq: Four times a day (QID) | ORAL | 0 refills | Status: DC | PRN

## 2019-10-04 NOTE — Discharge Instructions (Addendum)
You were evaluated in the Emergency Department and after careful evaluation, we did not find any emergent condition requiring admission or further testing in the hospital.  Your exam/testing today is overall reassuring.  Please return to the Emergency Department if you experience any worsening of your condition.  We encourage you to follow up with a primary care provider.  Thank you for allowing us to be a part of your care. 

## 2019-10-04 NOTE — ED Provider Notes (Signed)
[place] [facility] DEPT Provider Note   CSN: [id number] Arrival date & time: 10/04/19  1901     History Chief Complaint  Patient presents with  . Nausea  . Emesis    Jamie Palmer is a 34 y.o. female who presents today for evaluation of epigastric abdominal pain.  She reports that last night she was out for a birthday celebration for her boyfriend and ate enchiladas.  She states she then had a shot of whiskey and as soon as "hit the back of my throat" she immediately started vomiting.  She states that she tried to take some stomach pills, she does not know what they were.  She reports that she got pain in her abdomen when that started.  She has vomited twice today.  She reports she is able to tolerate water.  She states that she has been able to eat a few fries and some Ramen today however feels nauseous.  She denies a history of similar.   HPI     Past Medical History:  Diagnosis Date  . ADHD (attention deficit hyperactivity disorder)   . Anxiety   . Asthma    Uses inhaler prn  . Bradycardia 02/23/2015  . Depression   . Dizziness 06/10/2015  . Gestational diabetes    glyburide  . Shingles     Patient Active Problem List   Diagnosis Date Noted  . Dizziness 06/10/2015  . Bradycardia 02/23/2015  . Diabetes mellitus during pregnancy in third trimester 11/26/2014  . Gestational diabetes 03/29/2012  . SVD (spontaneous vaginal delivery) 03/29/2012    Past Surgical History:  Procedure Laterality Date  . APPENDECTOMY    . FOOT SURGERY    . TUBAL LIGATION Bilateral 11/27/2014   Procedure: POST PARTUM TUBAL LIGATION;  Surgeon: Cheri Fowler, MD;  Location: [facility];  Service: Gynecology;  Laterality: Bilateral;     OB History    Gravida  3   Para  3   Term  3   Preterm  0   AB  0   Living  3     SAB  0   TAB  0   Ectopic  0   Multiple  0   Live Births  3           Family History  Problem Relation Age of Onset  . Diabetes  Other   . Hypertension Other   . Heart disease Other   . Cancer Other   . COPD Other   . Asthma Other   . Heart attack Other   . Obesity Other   . Hyperlipidemia Other   . Cancer Mother        lung  . Cancer Maternal Grandmother        lung  . Cancer Father   . Heart attack Maternal Aunt   . Hypertension Maternal Aunt   . Diabetes Maternal Aunt   . Heart disease Maternal Aunt   . Cancer Maternal Grandfather        lung  . Other Neg Hx     Social History   Tobacco Use  . Smoking status: Current Every Day Smoker    Packs/day: 1.00    Years: 10.00    Pack years: 10.00    Types: Cigarettes  . Smokeless tobacco: Never Used  Substance Use Topics  . Alcohol use: Yes    Alcohol/week: 0.0 standard drinks  . Drug use: No    Comment: previous drug addiction 7 years  ago    Home Medications Prior to Admission medications   Medication Sig Start Date End Date Taking? Authorizing Provider  ibuprofen (ADVIL) 200 MG tablet Take 200 mg by mouth every 6 (six) hours as needed for moderate pain.   Yes [provider]  albuterol (PROVENTIL HFA;VENTOLIN HFA) 108 (90 Base) MCG/ACT inhaler Inhale 2 puffs into the lungs every 6 (six) hours as needed for wheezing or shortness of breath. Patient not taking: Reported on 10/04/2019 06/22/16   Enid Derry, PA-C  albuterol (VENTOLIN HFA) 108 (90 Base) MCG/ACT inhaler Inhale 1-2 puffs into the lungs every 6 (six) hours as needed for wheezing or shortness of breath. Patient not taking: Reported on 10/04/2019 02/21/19   Bethel Born, PA-C  amoxicillin-clavulanate (AUGMENTIN) 875-125 MG tablet Take 1 tablet by mouth every 12 (twelve) hours. Patient not taking: Reported on 08/18/2019 02/21/19   Bethel Born, PA-C  doxycycline (VIBRAMYCIN) 100 MG capsule Take 1 capsule (100 mg total) by mouth 2 (two) times daily. Patient not taking: Reported on 10/04/2019 08/18/19   Darr, Veryl Speak, PA-C  fluconazole (DIFLUCAN) 150 MG tablet Take 1 tablet (150  mg total) by mouth daily. Patient not taking: Reported on 10/04/2019 08/18/19   Darr, Veryl Speak, PA-C  ketoconazole (NIZORAL) 2 % shampoo Apply 1 application topically 2 (two) times a week. Patient not taking: Reported on 10/04/2019 08/18/19   Darr, Veryl Speak, PA-C  predniSONE (DELTASONE) 20 MG tablet Take 2 tablets (40 mg total) by mouth daily. Patient not taking: Reported on 08/18/2019 02/21/19   Bethel Born, PA-C    Allergies    Patient has no known allergies.  Review of Systems   Review of Systems  Constitutional: Negative for chills and fever.  HENT: Negative for congestion.   Eyes: Negative for visual disturbance.  Respiratory: Negative for shortness of breath.   Gastrointestinal: Positive for abdominal pain, nausea and vomiting. Negative for constipation and diarrhea.  Genitourinary: Negative for dysuria.  Musculoskeletal: Negative for back pain and neck pain.  Skin: Negative for color change and rash.  Neurological: Negative for weakness and headaches.  Psychiatric/Behavioral: Negative for confusion.    Physical Exam Updated Vital Signs BP 139/77   Pulse 85   Temp 98.8 F (37.1 C) (Oral)   Resp 16   SpO2 100%   Physical Exam Vitals and nursing note reviewed.  Constitutional:      General: She is not in acute distress.    Appearance: She is well-developed. She is obese. She is not ill-appearing.  HENT:     Head: Normocephalic and atraumatic.  Eyes:     Conjunctiva/sclera: Conjunctivae normal.  Cardiovascular:     Rate and Rhythm: Normal rate and regular rhythm.     Heart sounds: No murmur.  Pulmonary:     Effort: Pulmonary effort is normal. No respiratory distress.     Breath sounds: Normal breath sounds.  Abdominal:     Palpations: Abdomen is soft.     Tenderness: There is abdominal tenderness.     Comments: Worse in epigastric  Musculoskeletal:     Cervical back: Normal range of motion and neck supple.  Skin:    General: Skin is warm and dry.  Neurological:      General: No focal deficit present.     Mental Status: She is alert.     Cranial Nerves: No cranial nerve deficit.  Psychiatric:        Mood and Affect: Mood normal.  Behavior: Behavior normal.     ED Results / Procedures / Treatments   Labs (all labs ordered are listed, but only abnormal results are displayed) Labs Reviewed  COMPREHENSIVE METABOLIC PANEL - Abnormal; Notable for the following components:      Result Value   Glucose, Bld 245 (*)    Calcium 8.3 (*)    All other components within normal limits  CBC - Abnormal; Notable for the following components:   WBC 12.5 (*)    All other components within normal limits  LIPASE, BLOOD  URINALYSIS, ROUTINE W REFLEX MICROSCOPIC    EKG None  Radiology DG Chest Port 1 View  Result Date: 10/04/2019 CLINICAL DATA:  Chest pain EXAM: PORTABLE CHEST 1 VIEW COMPARISON:  02/21/2019 FINDINGS: The heart size and mediastinal contours are within normal limits. Both lungs are clear. The visualized skeletal structures are unremarkable. IMPRESSION: No active disease. Electronically Signed   By: Katherine Mantle M.D.   On: 10/04/2019 19:38    Procedures Procedures (including critical care time)  Medications Ordered in ED Medications  sodium chloride flush (NS) 0.9 % injection 3 mL (has no administration in time range)  sodium chloride 0.9 % bolus 1,000 mL (1,000 mLs Intravenous Pharmacy Charged 10/04/19 2023)  ondansetron (ZOFRAN) injection 4 mg (4 mg Intravenous Given 10/04/19 2023)  famotidine (PEPCID) IVPB 20 mg premix (20 mg Intravenous New Bag/Given 10/04/19 2031)    ED Course  I have reviewed the triage vital signs and the nursing notes.  Pertinent labs & imaging results that were available during my care of the patient were reviewed by me and considered in my medical decision making (see chart for details).    MDM Rules/Calculators/A&P                     Patient is a 34 year old woman who presents today for  evaluation of abdominal pain since last night.  On exam the pain is worse in the epigastric area.  She has been able to eat fries and soup today however reports 2 episodes of vomiting.  In the emergency room she was treated with IV famotidine, Zofran and IV fluids.  Plan is to check labs and reevaluate patient.  At shift change care was transferred to Dr. Pilar Plate who will follow pending studies, re-evaulate and determine disposition.    Note: Portions of this report may have been transcribed using voice recognition software. Every effort was made to ensure accuracy; however, inadvertent computerized transcription errors may be present  Final Clinical Impression(s) / ED Diagnoses Final diagnoses:  Acute gastritis, presence of bleeding unspecified, unspecified gastritis type    Rx / DC Orders ED Discharge Orders    None       Norman Clay 10/04/19 2111    Sabas Sous, MD 10/04/19 2146

## 2019-10-04 NOTE — ED Notes (Signed)
Pt a/o and ambulatory upon discharge. Pt verbalized understanding and denies any questions.  Pt educated on medications.

## 2019-10-04 NOTE — ED Triage Notes (Signed)
Pt reports N/V, epigastric abdominal pain that started last night. Reports that she ate enchiladas and had whiskey last night. Tried drinking Coke and taking "stomach pills" without relief.

## 2020-02-12 ENCOUNTER — Ambulatory Visit (HOSPITAL_COMMUNITY)
Admission: EM | Admit: 2020-02-12 | Discharge: 2020-02-12 | Disposition: A | Discharge status: Home / Self Care | Payer: Medicaid Other | Attending: Family Medicine | Admitting: Family Medicine

## 2020-02-12 ENCOUNTER — Other Ambulatory Visit: Payer: Self-pay

## 2020-02-12 ENCOUNTER — Encounter (HOSPITAL_COMMUNITY): Payer: Self-pay | Admitting: *Deleted

## 2020-02-12 DIAGNOSIS — J069 Acute upper respiratory infection, unspecified: Secondary | ICD-10-CM | POA: Diagnosis not present

## 2020-02-12 DIAGNOSIS — J029 Acute pharyngitis, unspecified: Secondary | ICD-10-CM | POA: Insufficient documentation

## 2020-02-12 DIAGNOSIS — R0602 Shortness of breath: Secondary | ICD-10-CM | POA: Insufficient documentation

## 2020-02-12 DIAGNOSIS — Z20822 Contact with and (suspected) exposure to covid-19: Secondary | ICD-10-CM | POA: Insufficient documentation

## 2020-02-12 DIAGNOSIS — Z79899 Other long term (current) drug therapy: Secondary | ICD-10-CM | POA: Insufficient documentation

## 2020-02-12 DIAGNOSIS — R05 Cough: Secondary | ICD-10-CM | POA: Diagnosis present

## 2020-02-12 DIAGNOSIS — F17210 Nicotine dependence, cigarettes, uncomplicated: Secondary | ICD-10-CM | POA: Insufficient documentation

## 2020-02-12 DIAGNOSIS — J40 Bronchitis, not specified as acute or chronic: Secondary | ICD-10-CM | POA: Diagnosis not present

## 2020-02-12 LAB — SARS CORONAVIRUS 2 (TAT 6-24 HRS): SARS Coronavirus 2: NEGATIVE

## 2020-02-12 MED ORDER — PREDNISONE 10 MG PO TABS: ORAL_TABLET | ORAL | 0 refills | Status: DC

## 2020-02-12 MED ORDER — ALBUTEROL SULFATE HFA 108 (90 BASE) MCG/ACT IN AERS: 2.0000 | INHALATION_SPRAY | Freq: Four times a day (QID) | RESPIRATORY_TRACT | 0 refills | Status: DC | PRN

## 2020-02-12 MED ORDER — BENZONATATE 100 MG PO CAPS: 100.0000 mg | ORAL_CAPSULE | Freq: Three times a day (TID) | ORAL | 0 refills | Status: DC

## 2020-02-12 MED ORDER — PROMETHAZINE-DM 6.25-15 MG/5ML PO SYRP: 5.0000 mL | ORAL_SOLUTION | Freq: Four times a day (QID) | ORAL | 0 refills | Status: DC | PRN

## 2020-02-12 NOTE — ED Triage Notes (Addendum)
Patient in with complaints of SOB, sore throat, productive cough, fatigue and runny nose that started on yesterday. Patient states sputum is thick and white in color. Patient states that she has a history of bronchitis. Patient states she she experiences pain with deep breathing. Rates pain at 6. Patient denies fever. Patient also requesting refill of albuterol inhaler.

## 2020-02-12 NOTE — ED Provider Notes (Signed)
[facility]    CSN: [id number] Arrival date & time: 02/12/20  1108      History   Chief Complaint Chief Complaint  Patient presents with  . Shortness of Breath  . Cough  . Nasal Congestion  . Sore Throat    HPI Jamie [facility] is a 34 y.o. female.   Here today with 1 day hx of sore throat, congestion, hacking productive cough, wheezing, chest tightness. Denies fever, chills, N/V/D, abdominal pain, rashes, SOB, CP. Has not been trying anything OTC at this point other than one dose of dayquil and some tea and honey. Hx of asthma but not regularly on inhalers, has had bronchitis and pneumonia several times in the past. No known sick contacts.        Past Medical History:  Diagnosis Date  . ADHD (attention deficit hyperactivity disorder)   . Anxiety   . Asthma    Uses inhaler prn  . Bradycardia 02/23/2015  . Depression   . Dizziness 06/10/2015  . Gestational diabetes    glyburide  . Shingles     Patient Active Problem List   Diagnosis Date Noted  . Dizziness 06/10/2015  . Bradycardia 02/23/2015  . Diabetes mellitus during pregnancy in third trimester 11/26/2014  . Gestational diabetes 03/29/2012  . SVD (spontaneous vaginal delivery) 03/29/2012    Past Surgical History:  Procedure Laterality Date  . APPENDECTOMY    . FOOT SURGERY    . TUBAL LIGATION Bilateral 11/27/2014   Procedure: POST PARTUM TUBAL LIGATION;  Surgeon: Lavina Hamman, MD;  Location: [facility];  Service: Gynecology;  Laterality: Bilateral;    OB History    Gravida  3   Para  3   Term  3   Preterm  0   AB  0   Living  3     SAB  0   TAB  0   Ectopic  0   Multiple  0   Live Births  3            Home Medications    Prior to Admission medications   Medication Sig Start Date End Date Taking? Authorizing Provider  albuterol (VENTOLIN HFA) 108 (90 Base) MCG/ACT inhaler Inhale 1-2 puffs into the lungs every 6 (six) hours as needed for wheezing or shortness of  breath. Patient not taking: Reported on 10/04/2019 02/21/19   Bethel Born, PA-C  albuterol (VENTOLIN HFA) 108 (90 Base) MCG/ACT inhaler Inhale 2 puffs into the lungs every 6 (six) hours as needed for wheezing or shortness of breath. 02/12/20   Particia Nearing, PA-C  benzonatate (TESSALON) 100 MG capsule Take 1 capsule (100 mg total) by mouth every 8 (eight) hours. 02/12/20   Particia Nearing, PA-C  ibuprofen (ADVIL) 200 MG tablet Take 200 mg by mouth every 6 (six) hours as needed for moderate pain.    [provider]  ketoconazole (NIZORAL) 2 % shampoo Apply 1 application topically 2 (two) times a week. Patient not taking: Reported on 10/04/2019 08/18/19   Darr, Veryl Speak, PA-C  predniSONE (DELTASONE) 10 MG tablet Take 6 tabs day one, 5 tabs day two, 4 tabs day three, etc 02/12/20   Particia Nearing, PA-C  promethazine-dextromethorphan (PROMETHAZINE-DM) 6.25-15 MG/5ML syrup Take 5 mLs by mouth 4 (four) times daily as needed for cough. 02/12/20   Particia Nearing, PA-C  sucralfate (CARAFATE) 1 g tablet Take 1 tablet (1 g total) by mouth 4 (four) times daily as needed. 10/04/19  Sabas Sous, MD    Family History Family History  Problem Relation Age of Onset  . Diabetes Other   . Hypertension Other   . Heart disease Other   . Cancer Other   . COPD Other   . Asthma Other   . Heart attack Other   . Obesity Other   . Hyperlipidemia Other   . Cancer Mother        lung  . Cancer Maternal Grandmother        lung  . Cancer Father   . Heart attack Maternal Aunt   . Hypertension Maternal Aunt   . Diabetes Maternal Aunt   . Heart disease Maternal Aunt   . Cancer Maternal Grandfather        lung  . Other Neg Hx     Social History Social History   Tobacco Use  . Smoking status: Current Every Day Smoker    Packs/day: 1.00    Years: 10.00    Pack years: 10.00    Types: Cigarettes  . Smokeless tobacco: Never Used  Vaping Use  . Vaping Use: Never used    Substance Use Topics  . Alcohol use: Yes    Alcohol/week: 0.0 standard drinks  . Drug use: No    Comment: previous drug addiction 7 years ago     Allergies   Patient has no known allergies.   Review of Systems Review of Systems PER HPI   Physical Exam Triage Vital Signs ED Triage Vitals  Enc Vitals Group     BP 02/12/20 1433 108/74     Pulse Rate 02/12/20 1433 61     Resp 02/12/20 1433 16     Temp 02/12/20 1433 98.2 F (36.8 C)     Temp Source 02/12/20 1433 Oral     SpO2 02/12/20 1433 99 %     Weight --      Height --      Head Circumference --      Peak Flow --      Pain Score 02/12/20 1437 6     Pain Loc --      Pain Edu? --      Excl. in GC? --    No data found.  Updated Vital Signs BP 108/74 (BP Location: Left Arm)   Pulse 61   Temp 98.2 F (36.8 C) (Oral)   Resp 16   LMP 02/02/2020   SpO2 99%   Visual Acuity Right Eye Distance:   Left Eye Distance:   Bilateral Distance:    Right Eye Near:   Left Eye Near:    Bilateral Near:     Physical Exam Vitals and nursing note reviewed.  Constitutional:      Appearance: Normal appearance. She is not ill-appearing.  HENT:     Head: Atraumatic.     Right Ear: Tympanic membrane normal.     Left Ear: Tympanic membrane normal.     Nose: Rhinorrhea present.     Mouth/Throat:     Mouth: Mucous membranes are moist.     Pharynx: Posterior oropharyngeal erythema present.  Eyes:     Extraocular Movements: Extraocular movements intact.     Conjunctiva/sclera: Conjunctivae normal.  Cardiovascular:     Rate and Rhythm: Normal rate and regular rhythm.     Heart sounds: Normal heart sounds.  Pulmonary:     Effort: Pulmonary effort is normal.     Breath sounds: Wheezing present. No rales.  Abdominal:  General: Bowel sounds are normal. There is no distension.     Palpations: Abdomen is soft.     Tenderness: There is no abdominal tenderness. There is no right CVA tenderness, left CVA tenderness or guarding.   Musculoskeletal:        General: Normal range of motion.     Cervical back: Normal range of motion and neck supple.  Skin:    General: Skin is warm and dry.  Neurological:     Mental Status: She is alert and oriented to person, place, and time.  Psychiatric:        Mood and Affect: Mood normal.        Thought Content: Thought content normal.        Judgment: Judgment normal.    UC Treatments / Results  Labs (all labs ordered are listed, but only abnormal results are displayed) Labs Reviewed  SARS CORONAVIRUS 2 (TAT 6-24 HRS)    EKG   Radiology No results found.  Procedures Procedures (including critical care time)  Medications Ordered in UC Medications - No data to display  Initial Impression / Assessment and Plan / UC Course  I have reviewed the triage vital signs and the nursing notes.  Pertinent labs & imaging results that were available during my care of the patient were reviewed by me and considered in my medical decision making (see chart for details).     Suspect viral URI exacerbating her asthma. Will r/o covid, isolation protocol reviewed and work note given. Tx with prednisone, albuterol inhaler, tessalon, phenergan DM, mucinex and discussed supportive home care. Return if worsening or not resolving.    Final Clinical Impressions(s) / UC Diagnoses   Final diagnoses:  Viral URI with cough  Bronchitis   Discharge Instructions   None    ED Prescriptions    Medication Sig Dispense Auth. Provider   albuterol (VENTOLIN HFA) 108 (90 Base) MCG/ACT inhaler  (Status: Discontinued) Inhale 2 puffs into the lungs every 6 (six) hours as needed for wheezing or shortness of breath. 18 g Particia Nearing, PA-C   benzonatate (TESSALON) 100 MG capsule Take 1 capsule (100 mg total) by mouth every 8 (eight) hours. 21 capsule Particia Nearing, [place]   promethazine-dextromethorphan (PROMETHAZINE-DM) 6.25-15 MG/5ML syrup Take 5 mLs by mouth 4 (four) times daily  as needed for cough. 100 mL Particia Nearing, PA-C   predniSONE (DELTASONE) 10 MG tablet Take 6 tabs day one, 5 tabs day two, 4 tabs day three, etc 21 tablet Particia Nearing, PA-C   albuterol (VENTOLIN HFA) 108 (90 Base) MCG/ACT inhaler Inhale 2 puffs into the lungs every 6 (six) hours as needed for wheezing or shortness of breath. 18 g Particia Nearing, [place]     PDMP not reviewed this encounter.   Particia Nearing, [place] 02/12/20 1514

## 2020-03-08 ENCOUNTER — Ambulatory Visit
Admission: EM | Admit: 2020-03-08 | Discharge: 2020-03-08 | Disposition: A | Discharge status: Home / Self Care | Payer: Medicaid Other | Attending: Physician Assistant | Admitting: Physician Assistant

## 2020-03-08 DIAGNOSIS — R42 Dizziness and giddiness: Secondary | ICD-10-CM

## 2020-03-08 LAB — POCT FASTING CBG KUC MANUAL ENTRY: POCT Glucose (KUC): 174 mg/dL — AB (ref 70–99)

## 2020-03-08 MED ORDER — MECLIZINE HCL 12.5 MG PO TABS: 12.5000 mg | ORAL_TABLET | Freq: Three times a day (TID) | ORAL | 0 refills | Status: DC | PRN

## 2020-03-08 NOTE — ED Triage Notes (Signed)
Pt states was at [employer] today around noon and became dizzy/light headed with near syncope. States went home and laid down for a while and woke up the same.

## 2020-03-08 NOTE — ED Provider Notes (Signed)
[facility]    CSN: [id number] Arrival date & time: 03/08/20  1519      History   Chief Complaint Chief Complaint  Patient presents with   Dizziness    HPI Jamie Palmer is a 34 y.o. female.   34 year old female comes in for acute onset of dizziness/lightheadedness today while shopping. Describes as spinning sensation as well as lightheadedness. Dizziness is worse with movement. Lightheadedness is constant without aggravating/alleviating factor. Denies URI symptoms, fever, urinary changes. LMP 03/02/2020, normal flow. s/p tubal ligation 5 years ago     Past Medical History:  Diagnosis Date   ADHD (attention deficit hyperactivity disorder)    Anxiety    Asthma    Uses inhaler prn   Bradycardia 02/23/2015   Depression    Dizziness 06/10/2015   Gestational diabetes    glyburide   Shingles     Patient Active Problem List   Diagnosis Date Noted   Dizziness 06/10/2015   Bradycardia 02/23/2015   Diabetes mellitus during pregnancy in third trimester 11/26/2014   Gestational diabetes 03/29/2012   SVD (spontaneous vaginal delivery) 03/29/2012    Past Surgical History:  Procedure Laterality Date   APPENDECTOMY     FOOT SURGERY     TUBAL LIGATION Bilateral 11/27/2014   Procedure: POST PARTUM TUBAL LIGATION;  Surgeon: Lavina Hamman, MD;  Location: [facility];  Service: Gynecology;  Laterality: Bilateral;    OB History    Gravida  3   Para  3   Term  3   Preterm  0   AB  0   Living  3     SAB  0   TAB  0   Ectopic  0   Multiple  0   Live Births  3            Home Medications    Prior to Admission medications   Medication Sig Start Date End Date Taking? Authorizing Provider  meclizine (ANTIVERT) 12.5 MG tablet Take 1 tablet (12.5 mg total) by mouth 3 (three) times daily as needed for dizziness. 03/08/20   Belinda Fisher, PA-C    Family History Family History  Problem Relation Age of Onset   Diabetes Other     Hypertension Other    Heart disease Other    Cancer Other    COPD Other    Asthma Other    Heart attack Other    Obesity Other    Hyperlipidemia Other    Cancer Mother        lung   Cancer Maternal Grandmother        lung   Cancer Father    Heart attack Maternal Aunt    Hypertension Maternal Aunt    Diabetes Maternal Aunt    Heart disease Maternal Aunt    Cancer Maternal Grandfather        lung   Other Neg Hx     Social History Social History   Tobacco Use   Smoking status: Current Every Day Smoker    Packs/day: 1.00    Years: 10.00    Pack years: 10.00    Types: Cigarettes   Smokeless tobacco: Never Used  Vaping Use   Vaping Use: Never used  Substance Use Topics   Alcohol use: Yes    Alcohol/week: 0.0 standard drinks   Drug use: No    Comment: previous drug addiction 7 years ago     Allergies   Patient has no known  allergies.   Review of Systems Review of Systems  Reason unable to perform ROS: See HPI as above.     Physical Exam Triage Vital Signs ED Triage Vitals [03/08/20 1716]  Enc Vitals Group     BP (!) 151/84     Pulse Rate 70     Resp 18     Temp 98.5 F (36.9 C)     Temp Source Oral     SpO2 98 %     Weight      Height      Head Circumference      Peak Flow      Pain Score 0     Pain Loc      Pain Edu?      Excl. in GC?    Orthostatic VS for the past 24 hrs:  BP- Lying Pulse- Lying BP- Sitting Pulse- Sitting BP- Standing at 0 minutes Pulse- Standing at 0 minutes  03/08/20 1724 127/80 57 120/72 66 125/75 65    Updated Vital Signs BP (!) 151/84 (BP Location: Left Arm)    Pulse 70    Temp 98.5 F (36.9 C) (Oral)    Resp 18    LMP 03/02/2020    SpO2 98%    Breastfeeding No   Physical Exam Constitutional:      General: She is not in acute distress.    Appearance: Normal appearance. She is well-developed. She is not ill-appearing, toxic-appearing or diaphoretic.  HENT:     Head: Normocephalic and atraumatic.      Right Ear: Tympanic membrane, ear canal and external ear normal. Tympanic membrane is not erythematous or bulging.     Left Ear: Tympanic membrane, ear canal and external ear normal. Tympanic membrane is not erythematous or bulging.     Nose:     Right Sinus: No maxillary sinus tenderness or frontal sinus tenderness.     Left Sinus: No maxillary sinus tenderness or frontal sinus tenderness.     Mouth/Throat:     Mouth: Mucous membranes are moist.     Pharynx: Oropharynx is clear. Uvula midline.  Eyes:     Conjunctiva/sclera: Conjunctivae normal.     Pupils: Pupils are equal, round, and reactive to light.  Cardiovascular:     Rate and Rhythm: Normal rate and regular rhythm.  Pulmonary:     Effort: Pulmonary effort is normal. No accessory muscle usage, prolonged expiration, respiratory distress or retractions.     Breath sounds: No decreased air movement or transmitted upper airway sounds. No decreased breath sounds.     Comments: LCTAB Musculoskeletal:     Cervical back: Normal range of motion and neck supple.  Skin:    General: Skin is warm and dry.  Neurological:     Mental Status: She is alert and oriented to person, place, and time.     Comments: Cranial nerves II-XII grossly intact. Strength 5/5 bilaterally for upper and lower extremity. Sensation intact. Normal coordination with normal finger to nose. Negative pronator drift, romberg. Gait intact. Able to ambulate on own without difficulty.        UC Treatments / Results  Labs (all labs ordered are listed, but only abnormal results are displayed) Labs Reviewed  POCT FASTING CBG [facility] MANUAL ENTRY - Abnormal; Notable for the following components:      Result Value   POCT Glucose ([facility]) 174 (*)    All other components within normal limits    EKG   Radiology No results found.  Procedures Procedures (including critical care time)  Medications Ordered in UC Medications - No data to display  Initial Impression /  Assessment and Plan / UC Course  I have reviewed the triage vital signs and the nursing notes.  Pertinent labs & imaging results that were available during my care of the patient were reviewed by me and considered in my medical decision making (see chart for details).    No alarming signs on exam.  Neurology exam grossly intact without focal deficits.  Patient able to ambulate on own without difficulty.  At this time, will use meclizine for dizziness.  Push fluids.  Return precautions given.  Patient expresses understanding and agrees to plan.  Final Clinical Impressions(s) / UC Diagnoses   Final diagnoses:  Dizziness  Lightheadedness   ED Prescriptions    Medication Sig Dispense Auth. Provider   meclizine (ANTIVERT) 12.5 MG tablet Take 1 tablet (12.5 mg total) by mouth 3 (three) times daily as needed for dizziness. 30 tablet Belinda Fisher, PA-C     PDMP not reviewed this encounter.   Belinda Fisher, PA-C 03/08/20 1747

## 2020-03-08 NOTE — Discharge Instructions (Signed)
No alarming signs on exam. Your neurology exam was normal. Start meclizine for dizziness. Keep hydrated, urine should be clear to pale yellow in color. Decrease soda intake. Follow-up with PCP for further evaluation and management needed. If worsening of symptoms, passing out, chest pain, shortness of breath, go to the emergency department for further evaluation.

## 2020-11-02 ENCOUNTER — Other Ambulatory Visit: Payer: Self-pay

## 2020-11-02 DIAGNOSIS — Y92002 Bathroom of unspecified non-institutional (private) residence single-family (private) house as the place of occurrence of the external cause: Secondary | ICD-10-CM | POA: Insufficient documentation

## 2020-11-02 DIAGNOSIS — J45909 Unspecified asthma, uncomplicated: Secondary | ICD-10-CM | POA: Diagnosis not present

## 2020-11-02 DIAGNOSIS — F17210 Nicotine dependence, cigarettes, uncomplicated: Secondary | ICD-10-CM | POA: Insufficient documentation

## 2020-11-02 DIAGNOSIS — R911 Solitary pulmonary nodule: Secondary | ICD-10-CM | POA: Diagnosis not present

## 2020-11-02 DIAGNOSIS — M542 Cervicalgia: Secondary | ICD-10-CM | POA: Diagnosis not present

## 2020-11-02 DIAGNOSIS — W182XXA Fall in (into) shower or empty bathtub, initial encounter: Secondary | ICD-10-CM | POA: Insufficient documentation

## 2020-11-02 DIAGNOSIS — S0990XA Unspecified injury of head, initial encounter: Secondary | ICD-10-CM | POA: Diagnosis present

## 2020-11-03 ENCOUNTER — Emergency Department (HOSPITAL_COMMUNITY)
Admission: EM | Admit: 2020-11-03 | Discharge: 2020-11-03 | Disposition: A | Discharge status: Home / Self Care | Payer: Medicaid Other | Attending: Emergency Medicine | Admitting: Emergency Medicine

## 2020-11-03 ENCOUNTER — Emergency Department (HOSPITAL_COMMUNITY): Payer: Medicaid Other

## 2020-11-03 ENCOUNTER — Encounter (HOSPITAL_COMMUNITY): Payer: Self-pay | Admitting: Emergency Medicine

## 2020-11-03 DIAGNOSIS — M542 Cervicalgia: Secondary | ICD-10-CM

## 2020-11-03 DIAGNOSIS — W19XXXA Unspecified fall, initial encounter: Secondary | ICD-10-CM

## 2020-11-03 DIAGNOSIS — R918 Other nonspecific abnormal finding of lung field: Secondary | ICD-10-CM

## 2020-11-03 MED ORDER — KETOROLAC TROMETHAMINE 30 MG/ML IJ SOLN
30.0000 mg | Freq: Once | INTRAMUSCULAR | Status: AC
  Administered 2020-11-03: 06:00:00 30 mg via INTRAMUSCULAR
  Filled 2020-11-03: qty 1

## 2020-11-03 MED ORDER — IBUPROFEN 600 MG PO TABS: 600.0000 mg | ORAL_TABLET | Freq: Four times a day (QID) | ORAL | 0 refills | Status: DC | PRN

## 2020-11-03 NOTE — ED Triage Notes (Signed)
Pt from home c/o of fall yesterday morning. Pt states she fell in the bathtub, hitting her head, and injuring her back. Pt was able to go to work today. Pt denies LOC, N/V, blurry vision.

## 2020-11-03 NOTE — ED Provider Notes (Signed)
[facility] Provider Note   CSN: [id number] Arrival date & time: 11/02/20  2211     History Chief Complaint  Patient presents with   Fall   Back Pain    Jamie Palmer is a 35 y.o. female.  Patient presents to the emergency department with a chief complaint of fall, head injury, and neck pain.  She states that she slipped while in the shower, hit her forehead, and now is complaining of left-sided neck pain.  She reports a burning sensation down the left side of her neck and left shoulder.  She denies diabetes or weakness.  She denies any loss of consciousness.  She is not anticoagulated.  The fall occurred almost 24 hours ago.  The history is provided by the patient. No language interpreter was used.      Past Medical History:  Diagnosis Date   ADHD (attention deficit hyperactivity disorder)    Anxiety    Asthma    Uses inhaler prn   Bradycardia 02/23/2015   Depression    Dizziness 06/10/2015   Gestational diabetes    glyburide   Shingles     Patient Active Problem List   Diagnosis Date Noted   Dizziness 06/10/2015   Bradycardia 02/23/2015   Diabetes mellitus during pregnancy in third trimester 11/26/2014   Gestational diabetes 03/29/2012   SVD (spontaneous vaginal delivery) 03/29/2012    Past Surgical History:  Procedure Laterality Date   APPENDECTOMY     FOOT SURGERY     TUBAL LIGATION Bilateral 11/27/2014   Procedure: POST PARTUM TUBAL LIGATION;  Surgeon: Lavina Hamman, MD;  Location: [facility];  Service: Gynecology;  Laterality: Bilateral;     OB History     Gravida  3   Para  3   Term  3   Preterm  0   AB  0   Living  3      SAB  0   IAB  0   Ectopic  0   Multiple  0   Live Births  3           Family History  Problem Relation Age of Onset   Diabetes Other    Hypertension Other    Heart disease Other    Cancer Other    COPD Other    Asthma Other    Heart attack Other    Obesity Other     Hyperlipidemia Other    Cancer Mother        lung   Cancer Maternal Grandmother        lung   Cancer Father    Heart attack Maternal Aunt    Hypertension Maternal Aunt    Diabetes Maternal Aunt    Heart disease Maternal Aunt    Cancer Maternal Grandfather        lung   Other Neg Hx     Social History   Tobacco Use   Smoking status: Every Day    Packs/day: 1.00    Years: 10.00    Pack years: 10.00    Types: Cigarettes   Smokeless tobacco: Never  Vaping Use   Vaping Use: Never used  Substance Use Topics   Alcohol use: Yes    Alcohol/week: 0.0 standard drinks   Drug use: No    Comment: previous drug addiction 7 years ago    Home Medications Prior to Admission medications   Medication Sig Start Date End Date Taking? Authorizing Provider  cyclobenzaprine (FLEXERIL)  10 MG tablet Take 1 tablet by mouth 3 (three) times daily as needed. 05/14/20  Yes [provider]  meloxicam (MOBIC) 15 MG tablet Take 1 tablet by mouth daily as needed for pain. 05/14/20  Yes [provider]  metFORMIN (GLUCOPHAGE) 1000 MG tablet Take 1,000 mg by mouth daily.   Yes [provider]    Allergies    Patient has no known allergies.  Review of Systems   Review of Systems  All other systems reviewed and are negative.  Physical Exam Updated Vital Signs BP (!) 148/82   Pulse 64   Temp 99.1 F (37.3 C) (Oral)   Resp 17   Ht 5\' 7"  (1.702 m)   Wt 90.7 kg   SpO2 99%   BMI 31.32 kg/m   Physical Exam Vitals and nursing note reviewed.  Constitutional:      General: She is not in acute distress.    Appearance: She is well-developed.  HENT:     Head: Normocephalic and atraumatic.  Eyes:     Conjunctiva/sclera: Conjunctivae normal.  Cardiovascular:     Rate and Rhythm: Normal rate and regular rhythm.     Heart sounds: No murmur heard. Pulmonary:     Effort: Pulmonary effort is normal. No respiratory distress.     Breath sounds: Normal breath sounds.   Abdominal:     Palpations: Abdomen is soft.     Tenderness: There is no abdominal tenderness.  Musculoskeletal:     Cervical back: Neck supple.     Comments: Left upper trap and left cervical paraspinals TTP Normal ROM and strength of LUE  Skin:    General: Skin is warm and dry.  Neurological:     Mental Status: She is alert and oriented to person, place, and time.  Psychiatric:        Mood and Affect: Mood normal.        Behavior: Behavior normal.    ED Results / Procedures / Treatments   Labs (all labs ordered are listed, but only abnormal results are displayed) Labs Reviewed - No data to display  EKG None  Radiology CT Cervical Spine Wo Contrast  Result Date: 11/03/2020 CLINICAL DATA:  Status post fall in bathtub. EXAM: CT CERVICAL SPINE WITHOUT CONTRAST TECHNIQUE: Multidetector CT imaging of the cervical spine was performed without intravenous contrast. Multiplanar CT image reconstructions were also generated. COMPARISON:  CT cervical spine 09/18/2017, chest x-ray 10/04/2019 FINDINGS: Alignment: Normal. Skull base and vertebrae: No acute fracture. No aggressive appearing focal osseous lesion or focal pathologic process. Soft tissues and spinal canal: No prevertebral fluid or swelling. No visible canal hematoma. Upper chest: Interval development of several left upper lobe pulmonary nodules measuring up to 6 mm (3: 85-88). Other: None. IMPRESSION: 1. No acute displaced fracture or traumatic listhesis of the cervical spine. 2. Interval development of several left upper lobe pulmonary nodules measuring up to 6 mm. Non-contrast chest CT at 3-6 months is recommended. If the nodules are stable at time of repeat CT, then future CT at 18-24 months (from today's scan) is considered optional for low-risk patients, but is recommended for high-risk patients. This recommendation follows the consensus statement: Guidelines for Management of Incidental Pulmonary Nodules Detected on CT Images: From  the Fleischner Society 2017; Radiology 2017; 284:228-243. These results will be called to the ordering clinician or representative by the Radiologist Assistant, and communication documented in the PACS or 12-02-1987. Electronically Signed   By: [employer].D.  On: 11/03/2020 04:38    Procedures Procedures   Medications Ordered in ED Medications  ketorolac (TORADOL) 30 MG/ML injection 30 mg (30 mg Intramuscular Given 11/03/20 0604)    ED Course  I have reviewed the triage vital signs and the nursing notes.  Pertinent labs & imaging results that were available during my care of the patient were reviewed by me and considered in my medical decision making (see chart for details).    MDM Rules/Calculators/A&P                          Patient here with fall.  She hit the front of her head, and "scorpioned."  She complains of left-sided neck pain.  Given the mechanism, will check CT.  CT shows no bony deformity.  There are several pulmonary nodules which appear to be new, I will follow-up with her doctor regarding these findings.  She does report having family history of lung cancer.  She has no weakness.  She feels improved after Toradol.  We will discharged home. Final Clinical Impression(s) / ED Diagnoses Final diagnoses:  Fall, initial encounter  Neck pain  Pulmonary nodules    Rx / DC Orders ED Discharge Orders          Ordered    ibuprofen (ADVIL) 600 MG tablet  Every 6 hours PRN        11/03/20 0617             Roxy Horseman, PA-C 11/03/20 0617    Dione Booze, MD 11/03/20 [phone]

## 2020-11-03 NOTE — Discharge Instructions (Addendum)
Please schedule an appointment with your doctor to be evaluated for the pulmonary nodules which were seen on CT.  You may need to have these monitored.  Let your doctor know about your family history of lung cancer.  Take medications as directed.

## 2021-03-22 ENCOUNTER — Other Ambulatory Visit: Payer: Self-pay

## 2021-03-22 ENCOUNTER — Ambulatory Visit
Admission: EM | Admit: 2021-03-22 | Discharge: 2021-03-22 | Disposition: A | Discharge status: Home / Self Care | Payer: 59

## 2021-03-22 DIAGNOSIS — J209 Acute bronchitis, unspecified: Secondary | ICD-10-CM | POA: Diagnosis not present

## 2021-03-22 MED ORDER — PREDNISONE 20 MG PO TABS: 40.0000 mg | ORAL_TABLET | Freq: Every day | ORAL | 0 refills | Status: AC

## 2021-03-22 NOTE — ED Triage Notes (Signed)
Pt c/o cough, sore throat, chills, tightness in chest.   Denies headache, nausea, vomiting, diarrhea, constipation.   Onset last Monday.

## 2021-03-22 NOTE — ED Provider Notes (Signed)
[facility]    CSN: [id number] Arrival date & time: 03/22/21  4481      History   Chief Complaint Chief Complaint  Patient presents with   Cough    HPI Jamie Palmer is a 35 y.o. female.   Here today for evaluation of chest congestion and cough that she has had lingering for the last week.  She reports that she thinks a week ago she had influenza as her boyfriend tested positive for same, but she reports that chest tightness and mild shortness of breath have continued to worsen with time.  She does have history of bronchitis.  She denies any fever.  She has not had any nausea, vomiting or diarrhea.  She does not report any treatment for symptoms.  The history is provided by the patient.  Cough Associated symptoms: shortness of breath and wheezing   Associated symptoms: no chills, no ear pain, no eye discharge, no fever and no sore throat    Past Medical History:  Diagnosis Date   ADHD (attention deficit hyperactivity disorder)    Anxiety    Asthma    Uses inhaler prn   Bradycardia 02/23/2015   Depression    Dizziness 06/10/2015   Gestational diabetes    glyburide   Shingles     Patient Active Problem List   Diagnosis Date Noted   Dizziness 06/10/2015   Bradycardia 02/23/2015   Diabetes mellitus during pregnancy in third trimester 11/26/2014   Gestational diabetes 03/29/2012   SVD (spontaneous vaginal delivery) 03/29/2012    Past Surgical History:  Procedure Laterality Date   APPENDECTOMY     FOOT SURGERY     TUBAL LIGATION Bilateral 11/27/2014   Procedure: POST PARTUM TUBAL LIGATION;  Surgeon: Lavina Hamman, MD;  Location: [facility];  Service: Gynecology;  Laterality: Bilateral;    OB History     Gravida  3   Para  3   Term  3   Preterm  0   AB  0   Living  3      SAB  0   IAB  0   Ectopic  0   Multiple  0   Live Births  3            Home Medications    Prior to Admission medications   Medication Sig Start Date  End Date Taking? Authorizing Provider  gabapentin (NEURONTIN) 100 MG capsule Take 100 mg by mouth as needed.   Yes [provider]  predniSONE (DELTASONE) 20 MG tablet Take 2 tablets (40 mg total) by mouth daily with breakfast for 5 days. 03/22/21 03/27/21 Yes Tomi Bamberger, PA-C  cyclobenzaprine (FLEXERIL) 10 MG tablet Take 1 tablet by mouth 3 (three) times daily as needed. 05/14/20   [provider]  ibuprofen (ADVIL) 600 MG tablet Take 1 tablet (600 mg total) by mouth every 6 (six) hours as needed. 11/03/20   Roxy Horseman, PA-C  metFORMIN (GLUCOPHAGE) 1000 MG tablet Take 1,000 mg by mouth daily.    [provider]    Family History Family History  Problem Relation Age of Onset   Diabetes Other    Hypertension Other    Heart disease Other    Cancer Other    COPD Other    Asthma Other    Heart attack Other    Obesity Other    Hyperlipidemia Other    Cancer Mother        lung   Cancer Maternal  Grandmother        lung   Cancer Father    Heart attack Maternal Aunt    Hypertension Maternal Aunt    Diabetes Maternal Aunt    Heart disease Maternal Aunt    Cancer Maternal Grandfather        lung   Other Neg Hx     Social History Social History   Tobacco Use   Smoking status: Every Day    Packs/day: 1.00    Years: 10.00    Pack years: 10.00    Types: Cigarettes   Smokeless tobacco: Never  Vaping Use   Vaping Use: Never used  Substance Use Topics   Alcohol use: Yes    Alcohol/week: 0.0 standard drinks   Drug use: No    Comment: previous drug addiction 7 years ago     Allergies   Patient has no known allergies.   Review of Systems Review of Systems  Constitutional:  Negative for chills and fever.  HENT:  Positive for congestion. Negative for ear pain, sinus pressure and sore throat.   Eyes:  Negative for discharge and redness.  Respiratory:  Positive for cough, shortness of breath and wheezing.   Gastrointestinal:  Negative for  abdominal pain, diarrhea, nausea and vomiting.    Physical Exam Triage Vital Signs ED Triage Vitals  Enc Vitals Group     BP 03/22/21 1143 127/81     Pulse Rate 03/22/21 1143 81     Resp 03/22/21 1143 18     Temp 03/22/21 1143 98.5 F (36.9 C)     Temp Source 03/22/21 1143 Oral     SpO2 03/22/21 1143 98 %     Weight --      Height --      Head Circumference --      Peak Flow --      Pain Score 03/22/21 1144 0     Pain Loc --      Pain Edu? --      Excl. in GC? --    No data found.  Updated Vital Signs BP 127/81 (BP Location: Left Arm)   Pulse 81   Temp 98.5 F (36.9 C) (Oral)   Resp 18   SpO2 98%   Physical Exam Vitals and nursing note reviewed.  Constitutional:      General: She is not in acute distress.    Appearance: Normal appearance. She is not ill-appearing.  HENT:     Head: Normocephalic and atraumatic.     Nose: Congestion (mild) present.     Mouth/Throat:     Mouth: Mucous membranes are moist.     Pharynx: No oropharyngeal exudate or posterior oropharyngeal erythema.  Eyes:     Conjunctiva/sclera: Conjunctivae normal.  Cardiovascular:     Rate and Rhythm: Normal rate and regular rhythm.     Heart sounds: Normal heart sounds. No murmur heard. Pulmonary:     Effort: Pulmonary effort is normal. No respiratory distress.     Breath sounds: Normal breath sounds. No wheezing, rhonchi or rales.     Comments: Deep breathing produces cough Skin:    General: Skin is warm and dry.  Neurological:     Mental Status: She is alert.  Psychiatric:        Mood and Affect: Mood normal.        Thought Content: Thought content normal.     UC Treatments / Results  Labs (all labs ordered are listed, but only abnormal results  are displayed) Labs Reviewed - No data to display  EKG   Radiology No results found.  Procedures Procedures (including critical care time)  Medications Ordered in UC Medications - No data to display  Initial Impression / Assessment  and Plan / UC Course  I have reviewed the triage vital signs and the nursing notes.  Pertinent labs & imaging results that were available during my care of the patient were reviewed by me and considered in my medical decision making (see chart for details).  Prednisone burst prescribed for suspected bronchitis.  Recommended follow-up if symptoms fail to improve or worsen in any way.  Final Clinical Impressions(s) / UC Diagnoses   Final diagnoses:  Acute bronchitis, unspecified organism   Discharge Instructions   None    ED Prescriptions     Medication Sig Dispense Auth. Provider   predniSONE (DELTASONE) 20 MG tablet Take 2 tablets (40 mg total) by mouth daily with breakfast for 5 days. 10 tablet Tomi Bamberger, PA-C      PDMP not reviewed this encounter.   Tomi Bamberger, PA-C 03/22/21 1213

## 2021-04-25 DIAGNOSIS — M545 Low back pain, unspecified: Secondary | ICD-10-CM | POA: Diagnosis not present

## 2021-05-17 IMAGING — US US PELVIS COMPLETE
1 series · 13 of 25 positions shown · non-contrast
Comparison: None.

CLINICAL DATA: Left lower quadrant pain for 1 day.

EXAM:
TRANSABDOMINAL AND TRANSVAGINAL ULTRASOUND OF PELVIS
DOPPLER ULTRASOUND OF OVARIES
TECHNIQUE: Both transabdominal and transvaginal ultrasound examinations of the
pelvis were performed. Transabdominal technique was performed for
global imaging of the pelvis including uterus, ovaries, adnexal
regions, and pelvic cul-de-sac.
It was necessary to proceed with endovaginal exam following the
transabdominal exam to visualize the adnexal structures. Color and
duplex Doppler ultrasound was utilized to evaluate blood flow to the
ovaries.

[Series 1: us pelvis complete · 13 of 75 slices shown]
[im 1/75]
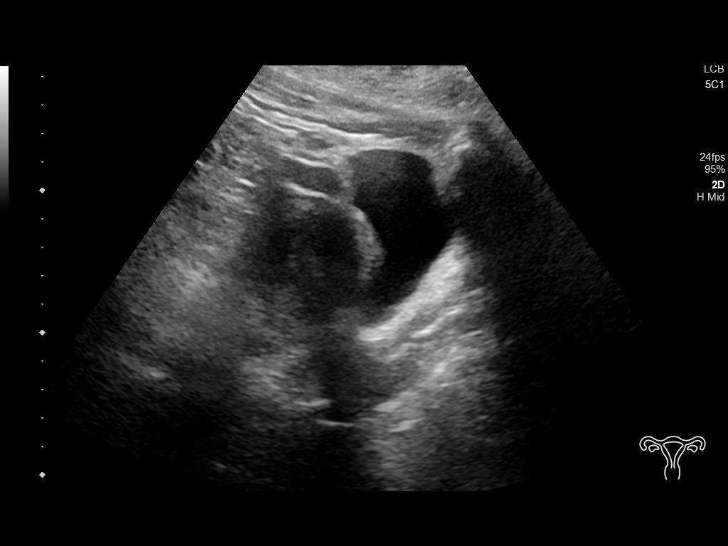
[im 7/75]
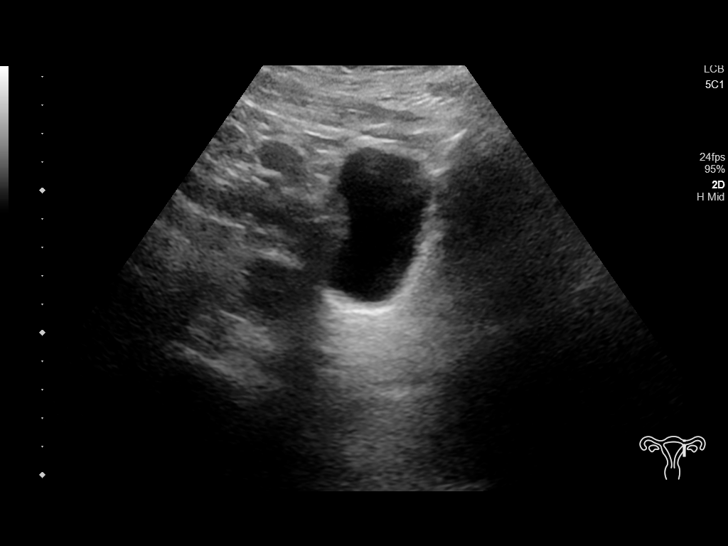
[im 13/75]
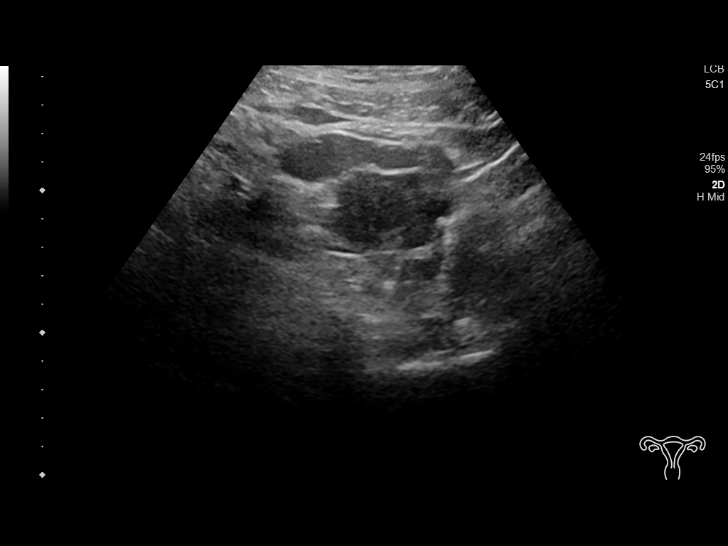
[im 19/75]
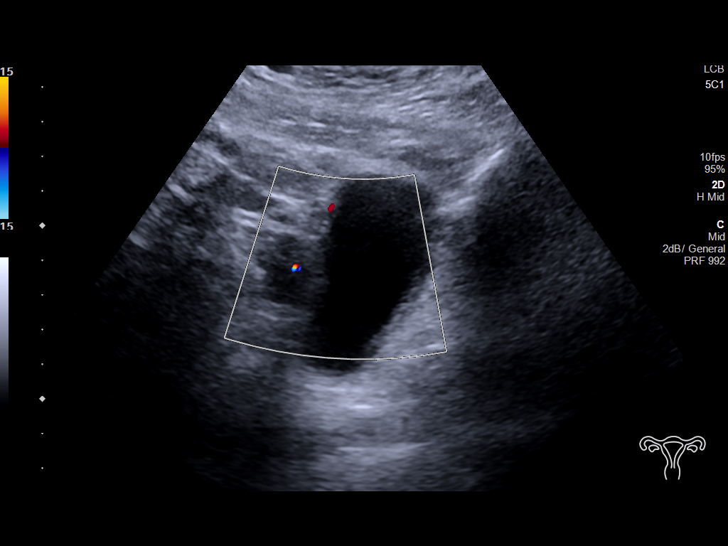
[im 25/75]
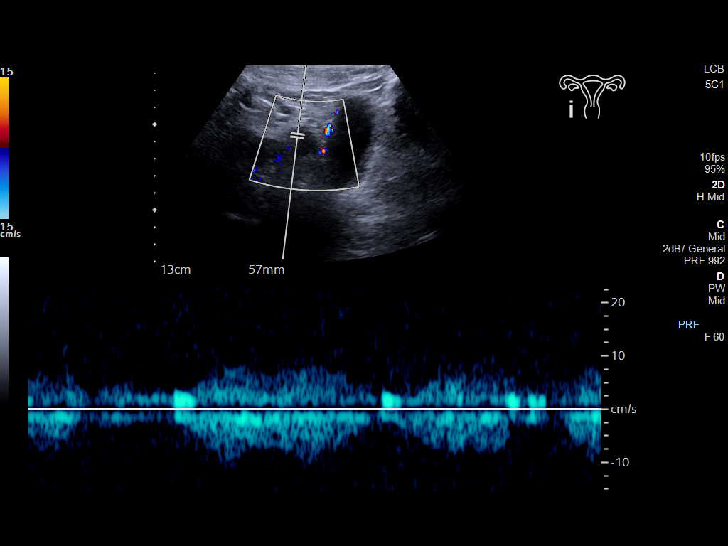
[im 31/75]
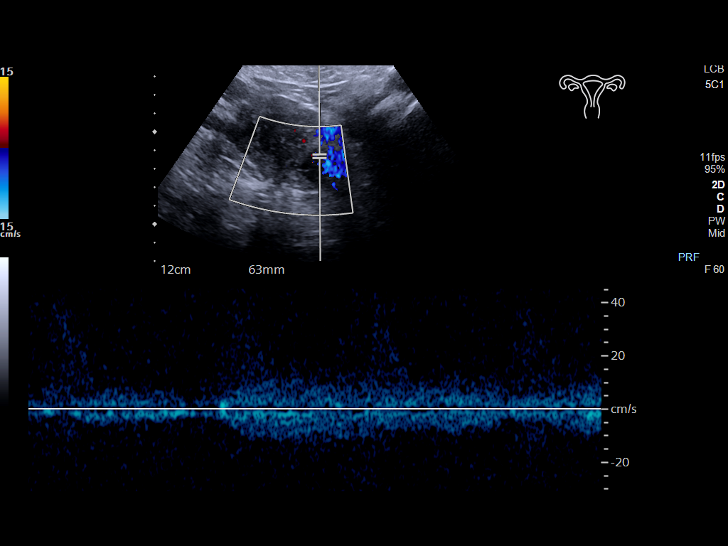
[im 38/75]
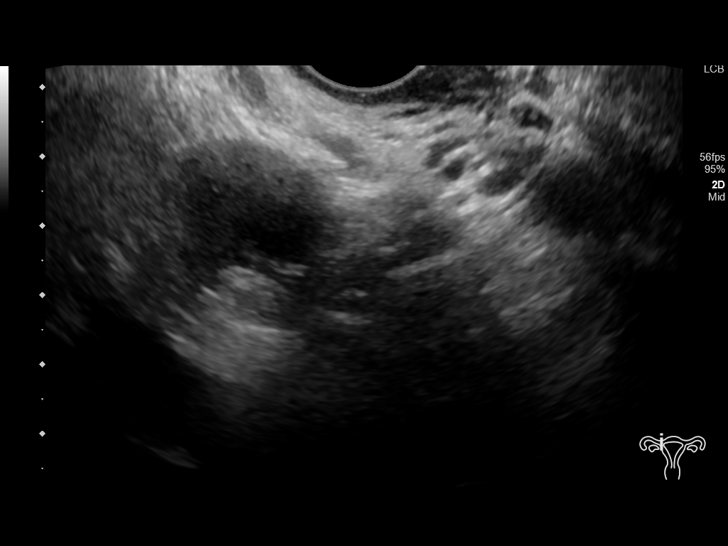
[im 44/75]
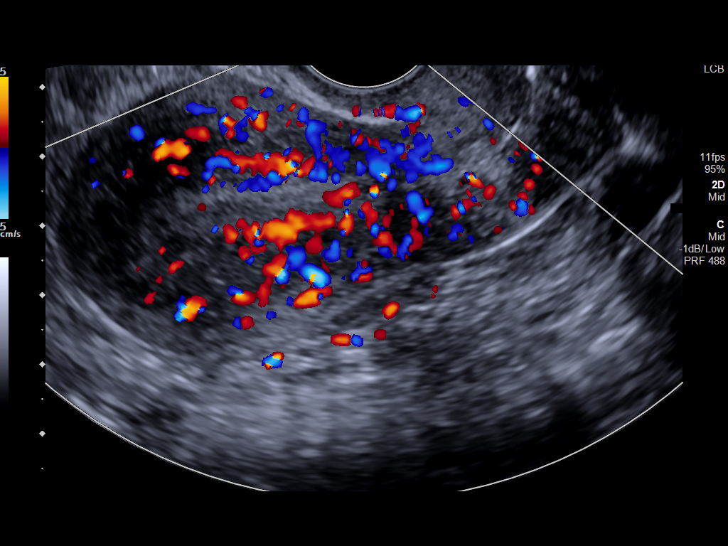
[im 50/75]
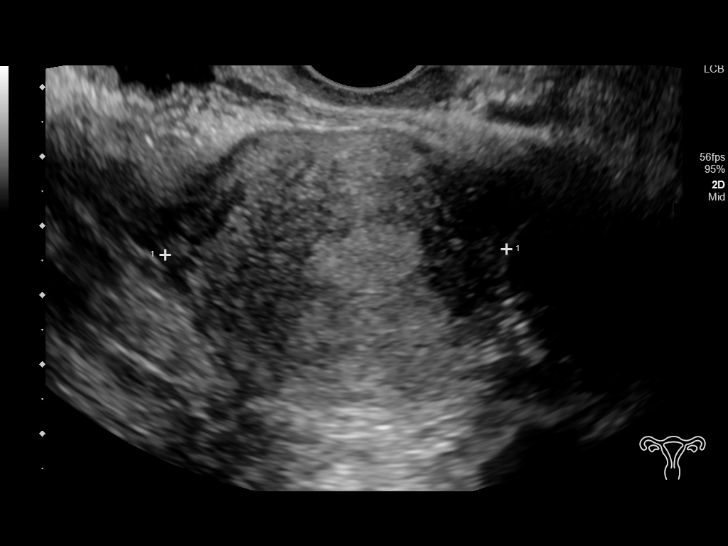
[im 56/75]
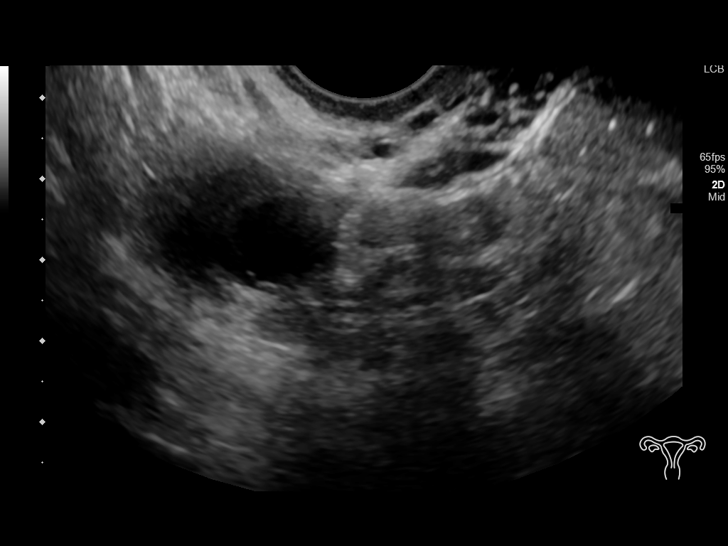
[im 62/75]
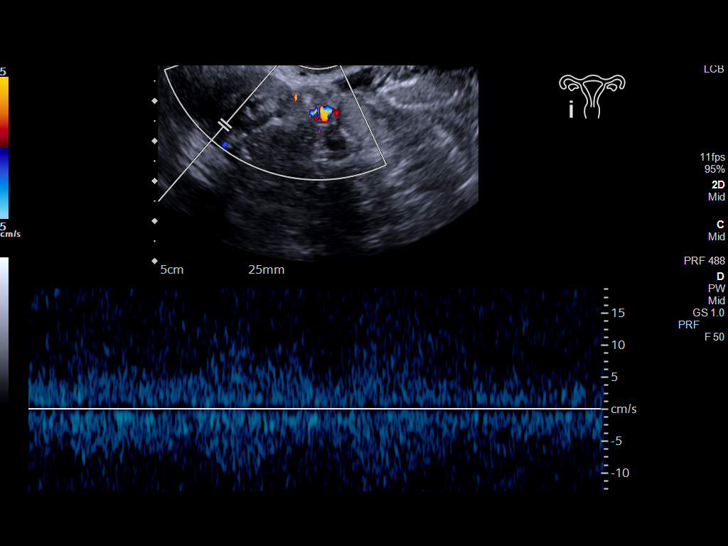
[im 68/75]
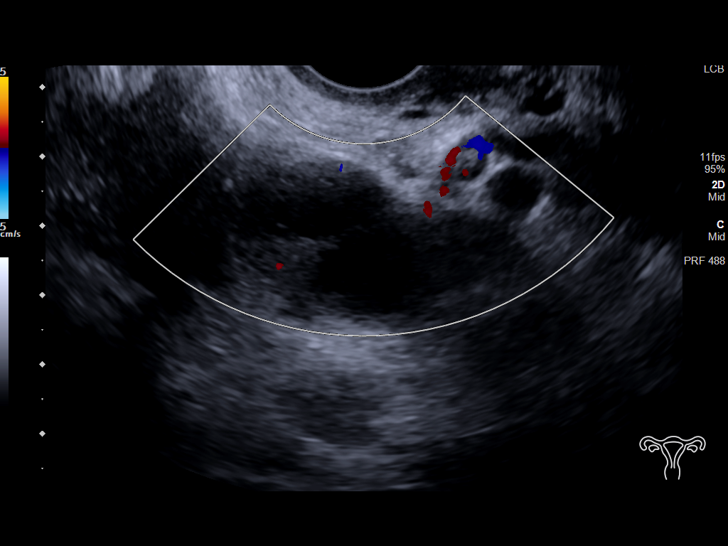
[im 75/75]
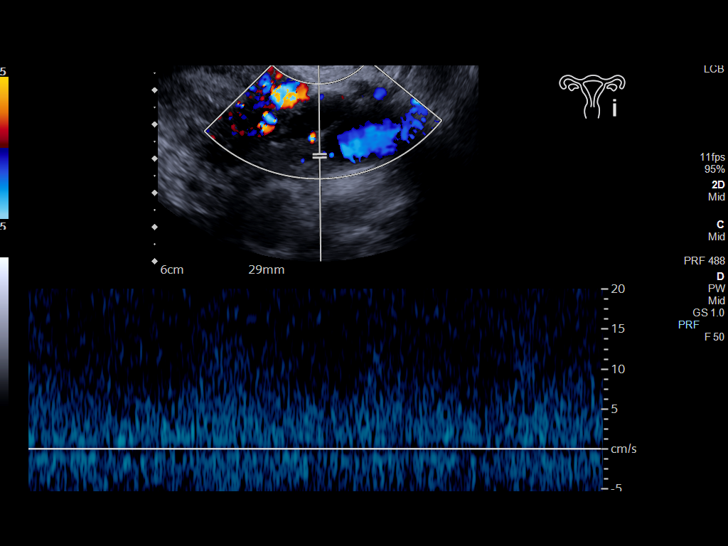

[13 of 25 positions shown; findings below may reference images not displayed]

FINDINGS: Uterus

Measurements: 8.2 x 4.0 x 4.9 cm = volume: 85 mL. No fibroids or
other mass visualized.

Endometrium

Thickness: 7 mm.  No focal abnormality visualized.

Right ovary

Measurements: 2.5 x 1.7 x 2.6 cm = volume: 6 mL. Normal
appearance/no adnexal mass.

Left ovary

Measurements: 3.3 x 2.3 x 3.4 cm = volume: 13 mL. Normal
appearance/no adnexal mass.

Pulsed Doppler evaluation of both ovaries demonstrates normal
low-resistance arterial and venous waveforms.

Other findings

No abnormal free fluid.
IMPRESSION: No acute process within the pelvis.

No sonographic evidence to suggest torsion.

## 2021-08-01 ENCOUNTER — Other Ambulatory Visit: Payer: Self-pay

## 2021-08-01 ENCOUNTER — Emergency Department (HOSPITAL_COMMUNITY)
Admission: EM | Admit: 2021-08-01 | Discharge: 2021-08-01 | Disposition: A | Discharge status: Home / Self Care | Payer: Medicaid Other | Attending: Emergency Medicine | Admitting: Emergency Medicine

## 2021-08-01 ENCOUNTER — Encounter (HOSPITAL_COMMUNITY): Payer: Self-pay | Admitting: Emergency Medicine

## 2021-08-01 DIAGNOSIS — H9201 Otalgia, right ear: Secondary | ICD-10-CM

## 2021-08-01 DIAGNOSIS — E119 Type 2 diabetes mellitus without complications: Secondary | ICD-10-CM | POA: Insufficient documentation

## 2021-08-01 DIAGNOSIS — R0981 Nasal congestion: Secondary | ICD-10-CM | POA: Diagnosis not present

## 2021-08-01 DIAGNOSIS — Z7984 Long term (current) use of oral hypoglycemic drugs: Secondary | ICD-10-CM | POA: Diagnosis not present

## 2021-08-01 DIAGNOSIS — H938X1 Other specified disorders of right ear: Secondary | ICD-10-CM | POA: Insufficient documentation

## 2021-08-01 DIAGNOSIS — Z20822 Contact with and (suspected) exposure to covid-19: Secondary | ICD-10-CM | POA: Diagnosis not present

## 2021-08-01 DIAGNOSIS — R0982 Postnasal drip: Secondary | ICD-10-CM | POA: Diagnosis not present

## 2021-08-01 LAB — RESP PANEL BY RT-PCR (FLU A&B, COVID) ARPGX2
Influenza A by PCR: NEGATIVE
Influenza B by PCR: NEGATIVE
SARS Coronavirus 2 by RT PCR: NEGATIVE

## 2021-08-01 LAB — GROUP A STREP BY PCR: Group A Strep by PCR: NOT DETECTED

## 2021-08-01 MED ORDER — AMOXICILLIN 875 MG PO TABS: 875.0000 mg | ORAL_TABLET | Freq: Two times a day (BID) | ORAL | 0 refills | Status: AC

## 2021-08-01 NOTE — ED Provider Notes (Signed)
?[facility]-EMERGENCY DEPT ?Provider Note ? ? ?CSN: [id number] ?Arrival date & time: 08/01/21  2118 ? ?  ? ?History ? ?No chief complaint on file. ? ? ?Jamie Palmer is a 36 y.o. female with chief complaint of right ear pain.  Started 7-10 days ago.  Hx of allergies.  Takes Claritin occasionally, which provides some relief.  Pt feels as if fluid is in her ear and has some mild congestion.  Denies fever, cough, shortness of breath, chest pain, or neck stiffness.  Denies dysphagia or difficulty swallowing.  Denies hearing loss or discharge from ear.  Has not been on recent antibiotics.  Mild throat tenderness.  Current daily tobacco user.  Hx of diabetes mellitus type II. ? ?The history is provided by the patient and medical records.  ? ?  ? ?Home Medications ?Prior to Admission medications   ?Medication Sig Start Date End Date Taking? Authorizing Provider  ?amoxicillin (AMOXIL) 875 MG tablet Take 1 tablet (875 mg total) by mouth 2 (two) times daily for 5 days. 08/01/21 08/06/21 Yes Cecil Cobbs, PA-C  ?cyclobenzaprine (FLEXERIL) 10 MG tablet Take 1 tablet by mouth 3 (three) times daily as needed. 05/14/20   [provider]  ?gabapentin (NEURONTIN) 100 MG capsule Take 100 mg by mouth as needed.    [provider]  ?ibuprofen (ADVIL) 600 MG tablet Take 1 tablet (600 mg total) by mouth every 6 (six) hours as needed. 11/03/20   Roxy Horseman, PA-C  ?metFORMIN (GLUCOPHAGE) 1000 MG tablet Take 1,000 mg by mouth daily.    [provider]  ?   ? ?Allergies    ?Patient has no known allergies.   ? ?Review of Systems   ?Review of Systems  ?HENT:  Positive for ear pain.   ? ?Physical Exam ?Updated Vital Signs ?BP 124/81 (BP Location: Left Arm)   Pulse 66   Temp 98.2 ?F (36.8 ?C) (Oral)   Resp 18   Ht 5\' 7"  (1.702 m)   Wt 88.5 kg   SpO2 99%   BMI 30.54 kg/m?  ?Physical Exam ?Vitals and nursing note reviewed.  ?Constitutional:   ?   General: She is not in acute  distress. ?   Appearance: Normal appearance. She is well-developed. She is obese. She is not ill-appearing or diaphoretic.  ?HENT:  ?   Head: Normocephalic and atraumatic.  ?   Right Ear: Ear canal and external ear normal. Tenderness present. A middle ear effusion is present. Tympanic membrane is injected.  ?   Left Ear: Tympanic membrane, ear canal and external ear normal.  ?   Nose: Congestion and rhinorrhea present.  ?   Mouth/Throat:  ?   Mouth: Mucous membranes are moist.  ?   Pharynx: Oropharynx is clear. No oropharyngeal exudate or posterior oropharyngeal erythema.  ?Eyes:  ?   Conjunctiva/sclera: Conjunctivae normal.  ?Cardiovascular:  ?   Rate and Rhythm: Normal rate and regular rhythm.  ?   Pulses: Normal pulses.  ?   Heart sounds: Normal heart sounds. No murmur heard. ?Pulmonary:  ?   Effort: Pulmonary effort is normal. No respiratory distress.  ?   Breath sounds: Normal breath sounds. No stridor. No wheezing.  ?Abdominal:  ?   Palpations: Abdomen is soft.  ?   Tenderness: There is no abdominal tenderness.  ?Musculoskeletal:  ?   Cervical back: Neck supple. No rigidity or tenderness.  ?Skin: ?   General: Skin is warm and dry.  ?  Capillary Refill: Capillary refill takes less than 2 seconds.  ?Neurological:  ?   Mental Status: She is alert and oriented to person, place, and time.  ?Psychiatric:     ?   Mood and Affect: Mood normal.  ? ? ?ED Results / Procedures / Treatments   ?Labs ?(all labs ordered are listed, but only abnormal results are displayed) ?Labs Reviewed  ?RESP PANEL BY RT-PCR (FLU A&B, COVID) ARPGX2  ?GROUP A STREP BY PCR  ? ? ?EKG ?None ? ?Radiology ?No results found. ? ?Procedures ?Procedures  ? ? ?Medications Ordered in ED ?Medications - No data to display ? ?ED Course/ Medical Decision Making/ A&P ?  ?                        ?Medical Decision Making ?Amount and/or Complexity of Data Reviewed ?External Data Reviewed: notes. ?Labs: ordered. Decision-making details documented in ED  Course. ? ?Risk ?OTC drugs. ?Prescription drug management. ? ? ?Patient presents with otalgia.  Exam suggestive of possible early otitis media.  No concern for acute mastoiditis, meningitis.  Negative for strep, covid, and influenza.  Afebrile.  No evidence of respiratory distress.  Physical exam not suggestive of acute pharyngitis, retropharyngeal abscess, tonsillar abscess.  No antibiotic use in the last month.  Patient discharged home with Amoxicillin.  Emergency department workup does not suggest an emergent condition requiring admission or immediate intervention beyond  what has been performed at this time.  The patient is safe for discharge and has been instructed to return immediately for worsening symptoms, change in symptoms or any other concerns ? ?I discussed the patient and their case with my attending, Dr. Jacqulyn Bath, who agreed with the proposed treatment course.  After consideration of the diagnostic results and the patients response to treatment, I feel that the patent would benefit from outpatient follow up with PCP, outpatient antibiotic course, and continued use of Claritin.  Discussed course of treatment thoroughly with the patient and her significant other, whom demonstrated understanding.  Patient in agreement and has no further questions. ? ?This chart was dictated using voice recognition software.  Despite best efforts to proofread,  errors can occur which can change the documentation meaning. ? ? ? ? ? ? ? ? ?Final Clinical Impression(s) / ED Diagnoses ?Final diagnoses:  ?Right ear pain  ?Post-nasal drip  ? ? ?Rx / DC Orders ?ED Discharge Orders   ? ?      Ordered  ?  amoxicillin (AMOXIL) 875 MG tablet  2 times daily       ? 08/01/21 2344  ? ?  ?  ? ?  ? ? ?  ?Cecil Cobbs, PA-C ?08/02/21 6203 ? ?  ?Maia Plan, MD ?08/02/21 2342 ? ?

## 2021-08-01 NOTE — Discharge Instructions (Addendum)
An antibiotic prescription has been called into your pharmacy.  Please take this once every 12 hours with food until completed course.   ? ?Continue taking your Claritin every day for added symptom relief. ? ?Follow-up with your primary care within the next 3 to 5 days as discussed.  If you do not have access to your normal primary care, a primary care office contact information is been provided for you. ? ?Return to the ED for new or worsening symptoms as discussed. ?

## 2021-08-01 NOTE — ED Triage Notes (Signed)
Pt states that her right ear has been hurting and after taking claritin her pain lessons. Pt states her ear pain is 7/10. Pt also states her low back hurts. ?

## 2021-11-13 ENCOUNTER — Emergency Department
Admission: EM | Admit: 2021-11-13 | Discharge: 2021-11-13 | Discharge status: Against Medical Advice | Payer: Medicaid Other | Attending: Emergency Medicine | Admitting: Emergency Medicine

## 2021-11-13 ENCOUNTER — Encounter: Payer: Self-pay | Admitting: Emergency Medicine

## 2021-11-13 DIAGNOSIS — Z5321 Procedure and treatment not carried out due to patient leaving prior to being seen by health care provider: Secondary | ICD-10-CM | POA: Insufficient documentation

## 2021-11-13 DIAGNOSIS — W57XXXA Bitten or stung by nonvenomous insect and other nonvenomous arthropods, initial encounter: Secondary | ICD-10-CM | POA: Insufficient documentation

## 2021-11-13 DIAGNOSIS — S30860A Insect bite (nonvenomous) of lower back and pelvis, initial encounter: Secondary | ICD-10-CM | POA: Insufficient documentation

## 2021-11-13 NOTE — ED Triage Notes (Signed)
Pt with c/o insect bite to the right buttocks x1 day. On assessment area is an apporx 4cm circle, red in color with white pustules. No streaking noted. Pt denies pain unless area is touched.

## 2021-12-20 DIAGNOSIS — M7751 Other enthesopathy of right foot: Secondary | ICD-10-CM | POA: Diagnosis not present

## 2021-12-20 DIAGNOSIS — M7752 Other enthesopathy of left foot: Secondary | ICD-10-CM | POA: Diagnosis not present

## 2022-02-04 DIAGNOSIS — N923 Ovulation bleeding: Secondary | ICD-10-CM | POA: Diagnosis not present

## 2022-02-04 DIAGNOSIS — Z6832 Body mass index (BMI) 32.0-32.9, adult: Secondary | ICD-10-CM | POA: Diagnosis not present

## 2022-02-04 DIAGNOSIS — F419 Anxiety disorder, unspecified: Secondary | ICD-10-CM | POA: Diagnosis not present

## 2022-02-04 DIAGNOSIS — Z3200 Encounter for pregnancy test, result unknown: Secondary | ICD-10-CM | POA: Diagnosis not present

## 2022-02-04 DIAGNOSIS — Z0130 Encounter for examination of blood pressure without abnormal findings: Secondary | ICD-10-CM | POA: Diagnosis not present

## 2022-02-04 DIAGNOSIS — E559 Vitamin D deficiency, unspecified: Secondary | ICD-10-CM | POA: Diagnosis not present

## 2022-02-04 DIAGNOSIS — E119 Type 2 diabetes mellitus without complications: Secondary | ICD-10-CM | POA: Diagnosis not present

## 2022-02-04 DIAGNOSIS — F32A Depression, unspecified: Secondary | ICD-10-CM | POA: Diagnosis not present

## 2022-02-04 DIAGNOSIS — R030 Elevated blood-pressure reading, without diagnosis of hypertension: Secondary | ICD-10-CM | POA: Diagnosis not present

## 2022-02-04 DIAGNOSIS — E7800 Pure hypercholesterolemia, unspecified: Secondary | ICD-10-CM | POA: Diagnosis not present

## 2022-03-09 ENCOUNTER — Other Ambulatory Visit: Payer: Self-pay

## 2022-03-09 ENCOUNTER — Emergency Department
Admission: EM | Admit: 2022-03-09 | Discharge: 2022-03-09 | Disposition: A | Discharge status: Home / Self Care | Payer: Medicaid Other | Attending: Emergency Medicine | Admitting: Emergency Medicine

## 2022-03-09 ENCOUNTER — Emergency Department: Payer: Medicaid Other

## 2022-03-09 ENCOUNTER — Encounter: Payer: Self-pay | Admitting: Emergency Medicine

## 2022-03-09 DIAGNOSIS — R059 Cough, unspecified: Secondary | ICD-10-CM | POA: Diagnosis not present

## 2022-03-09 DIAGNOSIS — Z1152 Encounter for screening for COVID-19: Secondary | ICD-10-CM | POA: Insufficient documentation

## 2022-03-09 DIAGNOSIS — R051 Acute cough: Secondary | ICD-10-CM | POA: Diagnosis not present

## 2022-03-09 DIAGNOSIS — J45909 Unspecified asthma, uncomplicated: Secondary | ICD-10-CM | POA: Diagnosis not present

## 2022-03-09 DIAGNOSIS — R0602 Shortness of breath: Secondary | ICD-10-CM | POA: Diagnosis not present

## 2022-03-09 LAB — RESP PANEL BY RT-PCR (FLU A&B, COVID) ARPGX2
Influenza A by PCR: NEGATIVE
Influenza B by PCR: NEGATIVE
SARS Coronavirus 2 by RT PCR: NEGATIVE

## 2022-03-09 MED ORDER — BENZONATATE 200 MG PO CAPS: 200.0000 mg | ORAL_CAPSULE | Freq: Three times a day (TID) | ORAL | 0 refills | Status: AC | PRN

## 2022-03-09 MED ORDER — ALBUTEROL SULFATE HFA 108 (90 BASE) MCG/ACT IN AERS: 2.0000 | INHALATION_SPRAY | Freq: Four times a day (QID) | RESPIRATORY_TRACT | 2 refills | Status: DC | PRN

## 2022-03-09 MED ORDER — AMOXICILLIN 500 MG PO CAPS: 1000.0000 mg | ORAL_CAPSULE | Freq: Three times a day (TID) | ORAL | 0 refills | Status: AC

## 2022-03-09 NOTE — ED Triage Notes (Signed)
Pt has what she describes as an "allergy cough" x a week. Today feels like her "lungs are burning"  Has had pna before.  Is a smoker.

## 2022-03-09 NOTE — Discharge Instructions (Addendum)
-  Please take the full course of the antibiotics as prescribed.  -You may utilize the albuterol inhaler as needed for chest tightness/shortness of breath.  You may take the benzonatate as needed for cough.  -Recommend taking Tylenol/ibuprofen as needed for fever/body aches.  -Follow-up with the results of your COVID/influenza testing online.  If positive for either 1, please isolate for the next 5 days to prevent further spread of the virus.  -Return to the emergency department anytime if you begin to experience any new or worsening symptoms.

## 2022-03-09 NOTE — ED Provider Notes (Signed)
[facility] Provider Note    Event Date/Time   First MD Initiated Contact with Patient 03/09/22 1606     (approximate)   History   Chief Complaint Cough   HPI Jamie Palmer is a 36 y.o. female, history of gestational diabetes, anxiety/depression, ADHD, remote history of asthma, presents to the emergency department for evaluation of cough.  Patient states that she has been having an ongoing cough with congestion for the past week.  For the past day or so, she states that her symptoms of worsened and is now endorsing some "burning sensation" in her lungs and shortness of breath.  She states that other family members have also been recently sick with similar symptoms.  Denies fever/chills, myalgias, abdominal pain, nausea/vomit, diarrhea, dizziness/lightheadedness, or numb/tingling upper or lower extremities.  History Limitations: No limitations.        Physical Exam  Triage Vital Signs: ED Triage Vitals  Enc Vitals Group     BP 03/09/22 1600 129/78     Pulse Rate 03/09/22 1600 91     Resp 03/09/22 1600 18     Temp 03/09/22 1600 98 F (36.7 C)     Temp Source 03/09/22 1600 Oral     SpO2 03/09/22 1600 97 %     Weight 03/09/22 1634 195 lb 1.7 oz (88.5 kg)     Height 03/09/22 1634 5\' 7"  (1.702 m)     Head Circumference --      Peak Flow --      Pain Score --      Pain Loc --      Pain Edu? --      Excl. in [place]? --     Most recent vital signs: Vitals:   03/09/22 1600  BP: 129/78  Pulse: 91  Resp: 18  Temp: 98 F (36.7 C)  SpO2: 97%    General: Awake, NAD.  Skin: Warm, dry. No rashes or lesions.  Eyes: PERRL. Conjunctivae normal.  CV: Good peripheral perfusion.  Resp: Normal effort.  Lung sounds are clear bilaterally. Abd: Soft, non-tender. No distention.  Neuro: At baseline. No gross neurological deficits.  Musculoskeletal: Normal ROM of all extremities.  Focused Exam: N/A.  Physical Exam    ED Results / Procedures / Treatments   Labs (all labs ordered are listed, but only abnormal results are displayed) Labs Reviewed  RESP PANEL BY RT-PCR (FLU A&B, COVID) ARPGX2     EKG N/A.    RADIOLOGY  ED Provider Interpretation: I personally viewed and interpreted this x-ray, no evidence of acute cardiopulmonary disease.  DG Chest 2 View  Result Date: 03/09/2022 CLINICAL DATA:  Cough EXAM: CHEST - 2 VIEW COMPARISON:  10/04/2019 FINDINGS: The heart size and mediastinal contours are within normal limits. Both lungs are clear. The visualized skeletal structures are unremarkable. IMPRESSION: No active cardiopulmonary disease. Electronically Signed   By: Donavan Foil M.D.   On: 03/09/2022 16:33    PROCEDURES:  Critical Care performed: N/A.    Procedures    MEDICATIONS ORDERED IN ED: Medications - No data to display   IMPRESSION / MDM / [place] / ED COURSE  I reviewed the triage vital signs and the nursing notes.                              Differential diagnosis includes, but is not limited to, COVID-19, influenza, commune acquired pneumonia, bronchitis, viral URI, allergic  rhinitis.   Assessment/Plan Patient presents with cough/congestion x1 week.  Endorses a acute worsening over the past few days.  Chest x-ray does not show any evidence of pneumonia, however the worsening is concerning.  She does state that she has had several cases of pneumonia in the past.  We will provide her with a prescription for amoxicillin to cover for possible developing pneumonia.  Additionally provided her with prescription for albuterol inhaler and benzonatate to help manage her symptoms.  Respiratory panel currently pending, however unlikely to change management at this time.  Recommend that she follow-up with her results online and quarantine as needed if positive for influenza or COVID-19.  She was agreeable to this plan.  Will discharge.  Provided the patient with anticipatory guidance, return precautions, and  educational material. Encouraged the patient to return to the emergency department at any time if they begin to experience any new or worsening symptoms. Patient expressed understanding and agreed with the plan.   Patient's presentation is most consistent with acute complicated illness / injury requiring diagnostic workup.       FINAL CLINICAL IMPRESSION(S) / ED DIAGNOSES   Final diagnoses:  Acute cough     Rx / DC Orders   ED Discharge Orders          Ordered    amoxicillin (AMOXIL) 500 MG capsule  3 times daily        03/09/22 1652    albuterol (VENTOLIN HFA) 108 (90 Base) MCG/ACT inhaler  Every 6 hours PRN        03/09/22 1652    benzonatate (TESSALON) 200 MG capsule  3 times daily PRN        03/09/22 1652             Note:  This document was prepared using Dragon voice recognition software and may include unintentional dictation errors.   Teodoro Spray, [place] 03/09/22 1701    Nance Pear, MD 03/09/22 [phone]

## 2022-03-27 DIAGNOSIS — H66001 Acute suppurative otitis media without spontaneous rupture of ear drum, right ear: Secondary | ICD-10-CM | POA: Diagnosis not present

## 2022-06-25 ENCOUNTER — Emergency Department: Payer: Medicaid Other

## 2022-06-25 ENCOUNTER — Emergency Department
Admission: EM | Admit: 2022-06-25 | Discharge: 2022-06-25 | Disposition: A | Discharge status: Home / Self Care | Payer: Medicaid Other | Attending: Emergency Medicine | Admitting: Emergency Medicine

## 2022-06-25 ENCOUNTER — Other Ambulatory Visit: Payer: Self-pay

## 2022-06-25 DIAGNOSIS — R079 Chest pain, unspecified: Secondary | ICD-10-CM | POA: Insufficient documentation

## 2022-06-25 DIAGNOSIS — M546 Pain in thoracic spine: Secondary | ICD-10-CM | POA: Diagnosis not present

## 2022-06-25 DIAGNOSIS — R0789 Other chest pain: Secondary | ICD-10-CM

## 2022-06-25 LAB — BASIC METABOLIC PANEL
Anion gap: 11 (ref 5–15)
BUN: 13 mg/dL (ref 6–20)
CO2: 21 mmol/L — ABNORMAL LOW (ref 22–32)
Calcium: 8.6 mg/dL — ABNORMAL LOW (ref 8.9–10.3)
Chloride: 104 mmol/L (ref 98–111)
Creatinine, Ser: 0.62 mg/dL (ref 0.44–1.00)
GFR, Estimated: >60 mL/min (ref >60)
Glucose, Bld: 195 mg/dL — ABNORMAL HIGH (ref 70–99)
Potassium: 4 mmol/L (ref 3.5–5.1)
Sodium: 136 mmol/L (ref 135–145)

## 2022-06-25 LAB — CBC
HCT: 38.1 % (ref 36.0–46.0)
Hemoglobin: 13 g/dL (ref 12.0–15.0)
MCH: 31.5 pg (ref 26.0–34.0)
MCHC: 34.1 g/dL (ref 30.0–36.0)
MCV: 92.3 fL (ref 80.0–100.0)
Platelets: 311 10*3/uL (ref 150–400)
RBC: 4.13 MIL/uL (ref 3.87–5.11)
RDW: 12.6 % (ref 11.5–15.5)
WBC: 11.3 10*3/uL — ABNORMAL HIGH (ref 4.0–10.5)
nRBC: 0 % (ref 0.0–0.2)

## 2022-06-25 LAB — TROPONIN I (HIGH SENSITIVITY): Troponin I (High Sensitivity): 4 ng/L (ref <18)

## 2022-06-25 NOTE — ED Triage Notes (Signed)
Pt to Ed from home for CP x 7 days. Pt does advised she was told she has nodules in her lungs but never followed up and got repeat imaging and is concerned this might be the cause of her chest pain. Pt is CAOx4 and in no acute distress and ambulatory in triage. Mother died of lung cancer at 77.

## 2022-06-25 NOTE — ED Provider Notes (Signed)
[facility] Provider Note   Event Date/Time   First MD Initiated Contact with Patient 06/25/22 1901     (approximate)  History   Chest Pain (X 7 days)  HPI  Jamie Palmer is a 37 y.o. female reports a history of tubal ligation and lung nodules for which she previously had follow-up but did not follow-up for CT scan  For about 1 week has had soreness that she thought was maybe a little of a pulled muscle in her right upper back and right chest.  However its persisted and she notices when she moves around or sometimes if she takes a deep breath she will get a bit of a twinge of pain up around the area above her right shoulder blade.  No falls or injuries.  She is taken ibuprofen which helps, does not wish for any pain medication.  No shortness of breath.  No chest pain on left side chest no pressures or discomfort no nausea or vomiting.  She reports otherwise healthy.  No history of any blood clots.  No leg swelling.  No long trips or travel.  No recent surgeries or trauma.  Does not take any estrogens.   No skin rashes  Physical Exam   Triage Vital Signs: ED Triage Vitals [06/25/22 1847]  Enc Vitals Group     BP (!) 142/92     Pulse Rate 66     Resp 16     Temp 98.2 F (36.8 C)     Temp Source Oral     SpO2 98 %     Weight 205 lb (93 kg)     Height 5' 6"$  (1.676 m)     Head Circumference      Peak Flow      Pain Score 9     Pain Loc      Pain Edu?      Excl. in [place]?     Most recent vital signs: Vitals:   06/25/22 1847  BP: (!) 142/92  Pulse: 66  Resp: 16  Temp: 98.2 F (36.8 C)  SpO2: 98%     General: Awake, no distress.  Resting comfortably.  Reports some tenderness to palpation over the superior portion of her right upper back just superior to the scapula.  Atraumatic no deformities.  Normal work of breathing normal oxygen saturation CV:  Good peripheral perfusion.  Normal tones and rate Resp:  Normal effort.  Clear  bilateral. Abd:  No distention.  Soft nontender Other:  No lower extremity edema venous cords or congestion   ED Results / Procedures / Treatments   Labs (all labs ordered are listed, but only abnormal results are displayed) Labs Reviewed  BASIC METABOLIC PANEL - Abnormal; Notable for the following components:      Result Value   CO2 21 (*)    Glucose, Bld 195 (*)    Calcium 8.6 (*)    All other components within normal limits  CBC - Abnormal; Notable for the following components:   WBC 11.3 (*)    All other components within normal limits  POC URINE PREG, ED  TROPONIN I (HIGH SENSITIVITY)  TROPONIN I (HIGH SENSITIVITY)     EKG  Reviewed interpreted by me at 1845 heart rate 60 QRS 80 QTc 410 Normal sinus rhythm no evidence of ischemia.     Patient self reports that she has a history of 'low heart rate'   RADIOLOGY  Chest x-ray interpreted by me  as normal   PROCEDURES:  Critical Care performed: No  Procedures   MEDICATIONS ORDERED IN ED: Medications - No data to display   IMPRESSION / MDM / [place] / ED COURSE  I reviewed the triage vital signs and the nursing notes.                              Differential diagnosis includes, but is not limited to, ACS, aortic dissection, pulmonary embolism, cardiac tamponade, pneumothorax, pneumonia, pericarditis, myocarditis, GI-related causes including esophagitis/gastritis, and musculoskeletal chest wall pain.    Patient has had discomfort seems to be the most likely musculoskeletal based on clinical history and exam for about 1 week.  No risk factors for pulmonary embolism.  No severe hypertension no ripping or tearing or moving pain no abnormalities would be suggestive of dissection.  EKG normal very atypical symptoms I doubt ACS.  Given the longevity of her symptoms, I believe checking a single troponin would be appropriate.  PERC negative  Patient's presentation is most consistent with acute  complicated illness / injury requiring diagnostic workup.   The patient is on the cardiac monitor to evaluate for evidence of arrhythmia and/or significant heart rate changes.  Offered pain relief, patient does not wish for anything.       Pulmonary Embolism Rule-out Criteria (PERC rule)                        If YES to ANY of the following, the PERC rule is not satisfied and cannot be used to rule out PE in this patient (consider d-dimer or imaging depending on pre-test probability).                      If NO to ALL of the following, AND the clinician's pre-test probability is <15%, the [facility] rule is satisfied and there is no need for further workup (including no need to obtain a d-dimer) as the post-test probability of pulmonary embolism is <2%.                      Mnemonic is HAD CLOTS   H - hormone use (exogenous estrogen)      No. A - age > 50                                                 No. D - DVT/PE history                                      No.   C - coughing blood (hemoptysis)                 No. L - leg swelling, unilateral                             No. O - O2 Sat on Room Air < 95%                  No. T - tachycardia (HR ? 100)  No. S - surgery or trauma, recent                      No.   Based on my evaluation of the patient, including application of this decision instrument, further testing to evaluate for pulmonary embolism is not indicated at this time. I have discussed this recommendation with the patient who states understanding and agreement with this plan.  ----------------------------------------- 8:30 PM on 06/25/2022 ----------------------------------------- All workup reassuring.  Normal troponin.  No evidence of acute ischemia.  Chest x-ray clear.  Very minimal leukocytosis quite nonspecific.  Chemistry reviewed normal with exception of minimally reduced CO2.  Return precautions and treatment recommendations and follow-up  discussed with the patient who is agreeable with the plan.   FINAL CLINICAL IMPRESSION(S) / ED DIAGNOSES   Final diagnoses:  Chest pain with low risk of acute coronary syndrome  Musculoskeletal chest pain     Rx / DC Orders   ED Discharge Orders          Ordered    AMB  Referral to Pulmonary Nodule Clinic       Comments: History of nodules but never followed-up up. Follow-up requested   06/25/22 1912             Note:  This document was prepared using Dragon voice recognition software and may include unintentional dictation errors.   Delman Kitten, MD 06/25/22 2030

## 2022-06-26 DIAGNOSIS — M79671 Pain in right foot: Secondary | ICD-10-CM | POA: Diagnosis not present

## 2022-06-26 DIAGNOSIS — M79672 Pain in left foot: Secondary | ICD-10-CM | POA: Diagnosis not present

## 2022-06-26 DIAGNOSIS — M722 Plantar fascial fibromatosis: Secondary | ICD-10-CM | POA: Diagnosis not present

## 2022-07-04 ENCOUNTER — Ambulatory Visit (INDEPENDENT_AMBULATORY_CARE_PROVIDER_SITE_OTHER): Payer: Medicaid Other | Admitting: Pulmonary Disease

## 2022-07-04 ENCOUNTER — Encounter: Payer: Self-pay | Admitting: Pulmonary Disease

## 2022-07-04 VITALS — BP 128/74 | HR 84 | Temp 97.9°F | Ht 66.5 in | Wt 202.0 lb

## 2022-07-04 DIAGNOSIS — R918 Other nonspecific abnormal finding of lung field: Secondary | ICD-10-CM

## 2022-07-04 DIAGNOSIS — R0602 Shortness of breath: Secondary | ICD-10-CM

## 2022-07-04 DIAGNOSIS — R062 Wheezing: Secondary | ICD-10-CM

## 2022-07-04 LAB — NITRIC OXIDE: Nitric Oxide: 5

## 2022-07-04 MED ORDER — FLUTICASONE-SALMETEROL 250-50 MCG/ACT IN AEPB: 1.0000 | INHALATION_SPRAY | Freq: Two times a day (BID) | RESPIRATORY_TRACT | 6 refills | Status: DC

## 2022-07-04 NOTE — Progress Notes (Signed)
Subjective:    Patient ID: Jamie Palmer, female    DOB: Nov 06, 1985, 37 y.o.   MRN: [id number] Patient Care Team: [facility], [place] as PCP - General  Chief Complaint  Patient presents with   pulmonary consult    CXR 06/2022--SOB with exertion, occ wheezing and prod cough with thick white to yellow sputum.     HPI This is a 37 year old current smoker (1 PPD,24 PY) with a history as noted below, who presents for evaluation of lung nodules seen on limited imaging on June 2022 and never followed up.  The patient was kindly referred by Dr. Delman Kitten.  The patient gets primary care through the [facility] in [place].  Patient was in the emergency room on 25 June 2022 with a complaint of chest pain.  She had a chest x-ray that did not show any active disease.  However it was noted that in June 2022 she had had 3 nodules noted on the left upper lobe on limited chest windows on a CT performed for C-spine evaluation.  The patient states that she was told that she needed to have this "checked out".  She is here for that purpose.  She works in [employer], educational.  She has not noted any undue cough or sputum production.  She does get short of breath from time to time and has to use rescue albuterol.  She is not on any maintenance inhalers.  She has not had any orthopnea, paroxysmal nocturnal dyspnea or lower extremity edema.  No weight loss or anorexia.  No hemoptysis.  No history of being sickly or having asthma as a child.  She states that her mother had small cell cancer and did pass away from this.  She smokes 1 pack of cigarettes per day and has done so since age 37.  She does not endorse any other history nor symptomatology.   Review of Systems A 10 point review of systems was performed and it is as noted above otherwise negative.  Past Medical History:  Diagnosis Date   ADHD (attention deficit hyperactivity disorder)    Anxiety    Asthma    Uses inhaler prn   Bradycardia  02/23/2015   Depression    Dizziness 06/10/2015   Gestational diabetes    glyburide   Shingles    Past Surgical History:  Procedure Laterality Date   APPENDECTOMY     FOOT SURGERY     TUBAL LIGATION Bilateral 11/27/2014   Procedure: POST PARTUM TUBAL LIGATION;  Surgeon: Cheri Fowler, MD;  Location: [facility];  Service: Gynecology;  Laterality: Bilateral;   Patient Active Problem List   Diagnosis Date Noted   Dizziness 06/10/2015   Bradycardia 02/23/2015   Diabetes mellitus during pregnancy in third trimester 11/26/2014   Gestational diabetes 03/29/2012   SVD (spontaneous vaginal delivery) 03/29/2012   Family History  Problem Relation Age of Onset   Diabetes Other    Hypertension Other    Heart disease Other    Cancer Other    COPD Other    Asthma Other    Heart attack Other    Obesity Other    Hyperlipidemia Other    Cancer Mother        lung   Cancer Maternal Grandmother        lung   Cancer Father    Heart attack Maternal Aunt    Hypertension Maternal Aunt    Diabetes Maternal Aunt    Heart disease Maternal Aunt  Cancer Maternal Grandfather        lung   Other Neg Hx    Social History   Tobacco Use   Smoking status: Every Day    Packs/day: 1.00    Years: 24.00    Total pack years: 24.00    Types: Cigarettes   Smokeless tobacco: Never  Substance Use Topics   Alcohol use: Yes    Alcohol/week: 0.0 standard drinks of alcohol   No Known Allergies Current Meds  Medication Sig   fluticasone-salmeterol (ADVAIR DISKUS) 250-50 MCG/ACT AEPB Inhale 1 puff into the lungs in the morning and at bedtime.   metFORMIN (GLUCOPHAGE) 1000 MG tablet Take 1,000 mg by mouth daily.   methylPREDNISolone (MEDROL DOSEPAK) 4 MG TBPK tablet TAKE 6 TABLETS ON DAY 1 AS DIRECTED ON PACKAGE AND DECREASE BY 1 TAB EACH DAY FOR A TOTAL OF 6 DAYS   Immunization History  Administered Date(s) Administered   DTaP 01/03/2012   Influenza Whole 02/22/2012   Influenza,inj,Quad PF,6+ Mos  04/04/2019      Objective:   Physical Exam BP 128/74 (BP Location: Left Arm, Cuff Size: Normal)   Pulse 84   Temp 97.9 F (36.6 C) (Temporal)   Ht 5' 6.5" (1.689 m)   Wt 202 lb (91.6 kg)   SpO2 99%   BMI 32.12 kg/m   SpO2: 99 % O2 Device: None (Room air)  GENERAL: Well-developed, overweight woman, no acute distress.  Fully ambulatory, no conversational dyspnea. HEAD: Normocephalic, atraumatic.  EYES: Pupils equal, round, reactive to light.  No scleral icterus.  MOUTH: Some teeth in poor repair, oral mucosa moist.  No thrush. NECK: Supple. No thyromegaly. Trachea midline. No JVD.  No adenopathy. PULMONARY: Good air entry bilaterally.  There are scattered end expiratory wheezes, otherwise, no adventitious sounds. CARDIOVASCULAR: S1 and S2. Regular rate and rhythm.  No rubs, murmurs or gallops heard. ABDOMEN: No overt focal deficit, no gait disturbance, speech is fluent. MUSCULOSKELETAL: No joint deformity, no clubbing, no edema.  NEUROLOGIC: No overt focal deficit, no gait disturbance, speech is fluent. SKIN: Intact,warm,dry. PSYCH: Mood and behavior normal.  Lab Results  Component Value Date   NITRICOXIDE 5 07/04/2022   Representative image of CT cervical spine performed 03 November 2020 showing 3 nodules on the left upper lobe (arrows):       Assessment & Plan:     ICD-10-CM   1. SOB (shortness of breath)  R06.02 Nitric oxide   Wheezing noted on on exam Trial of Advair 250/50 1 inhalation twice a day Continue as needed albuterol PFTs    2. Wheezing  R06.2 Pulmonary Function Test [facility] Only   Suspect asthmatic bronchitis, chronic Should help clarify Trial of Advair as above    3. Lung nodules  R91.8 CT CHEST WO CONTRAST   Noted on cervical spine CT June 2022 Had no follow-up Dedicated chest CT to evaluate     Orders Placed This Encounter  Procedures   CT CHEST WO CONTRAST    Standing Status:   Future    Standing Expiration Date:   07/05/2023    Order Specific  Question:   Is patient pregnant?    Answer:   No    Order Specific Question:   Preferred imaging location?    Answer:   [facility]   Nitric oxide   Pulmonary Function Test [facility] Only    Standing Status:   Future    Standing Expiration Date:   07/05/2023    Order Specific Question:  Full PFT: includes the following: basic spirometry, spirometry pre & post bronchodilator, diffusion capacity (DLCO), lung volumes    Answer:   Full PFT    Order Specific Question:   This test can only be performed at    Answer:   [facility] ordered this encounter  Medications   fluticasone-salmeterol (ADVAIR DISKUS) 250-50 MCG/ACT AEPB    Sig: Inhale 1 puff into the lungs in the morning and at bedtime.    Dispense:  60 each    Refill:  6   Will see the patient follow-up in 6 to 8 weeks time she is to contact us prior to that time should any new difficulties arise.    Renold Don, MD Advanced Bronchoscopy PCCM [facility]    *This note was dictated using voice recognition software/Dragon.  Despite best efforts to proofread, errors can occur which can change the meaning. Any transcriptional errors that result from this process are unintentional and may not be fully corrected at the time of dictation.

## 2022-07-04 NOTE — Patient Instructions (Signed)
We are scheduling a CT of the chest to follow-up on the lung nodules.  We are scheduling breathing test to check on your wheezing.  We are giving you a trial of an inhaler called Advair 250/50, 1 inhalation twice a day.  Make sure you rinse your mouth well after you use the inhaler.  Will use your emergency inhaler (albuterol) as needed.  We will see you in follow-up in 6 to 8 weeks time call sooner should any new problems arise.

## 2022-07-14 ENCOUNTER — Ambulatory Visit
Admission: RE | Admit: 2022-07-14 | Discharge: 2022-07-14 | Disposition: A | Discharge status: Home / Self Care | Payer: Medicaid Other | Source: Ambulatory Visit | Attending: Pulmonary Disease | Admitting: Pulmonary Disease

## 2022-07-14 DIAGNOSIS — R918 Other nonspecific abnormal finding of lung field: Secondary | ICD-10-CM | POA: Insufficient documentation

## 2022-07-14 DIAGNOSIS — R911 Solitary pulmonary nodule: Secondary | ICD-10-CM | POA: Insufficient documentation

## 2022-07-19 ENCOUNTER — Other Ambulatory Visit: Payer: Self-pay

## 2022-07-19 DIAGNOSIS — R918 Other nonspecific abnormal finding of lung field: Secondary | ICD-10-CM

## 2022-07-27 ENCOUNTER — Ambulatory Visit: Payer: Medicaid Other | Attending: Pulmonary Disease

## 2022-07-27 DIAGNOSIS — J45909 Unspecified asthma, uncomplicated: Secondary | ICD-10-CM | POA: Diagnosis not present

## 2022-07-27 DIAGNOSIS — F17210 Nicotine dependence, cigarettes, uncomplicated: Secondary | ICD-10-CM | POA: Diagnosis not present

## 2022-07-27 DIAGNOSIS — R062 Wheezing: Secondary | ICD-10-CM

## 2022-07-27 LAB — PULMONARY FUNCTION TEST ARMC ONLY
DL/VA % pred: 80 %
DL/VA: 3.58 ml/min/mmHg/L
DLCO unc % pred: 87 %
DLCO unc: 20.75 ml/min/mmHg
FEF 25-75 Post: 2.3 L/sec
FEF 25-75 Pre: 2.05 L/sec
FEF2575-%Change-Post: 12 %
FEF2575-%Pred-Post: 68 %
FEF2575-%Pred-Pre: 61 %
FEV1-%Change-Post: 3 %
FEV1-%Pred-Post: 82 %
FEV1-%Pred-Pre: 80 %
FEV1-Post: 2.71 L
FEV1-Pre: 2.63 L
FEV1FVC-%Change-Post: 10 %
FEV1FVC-%Pred-Pre: 89 %
FEV6-%Change-Post: -6 %
FEV6-%Pred-Post: 84 %
FEV6-%Pred-Pre: 90 %
FEV6-Post: 3.31 L
FEV6-Pre: 3.55 L
FEV6FVC-%Pred-Post: 101 %
FEV6FVC-%Pred-Pre: 101 %
FVC-%Change-Post: -6 %
FVC-%Pred-Post: 83 %
FVC-%Pred-Pre: 89 %
FVC-Post: 3.31 L
FVC-Pre: 3.55 L
Post FEV1/FVC ratio: 82 %
Post FEV6/FVC ratio: 100 %
Pre FEV1/FVC ratio: 74 %
Pre FEV6/FVC Ratio: 100 %
RV % pred: 170 %
RV: 2.77 L
TLC % pred: 121 %
TLC: 6.53 L

## 2022-07-27 MED ORDER — ALBUTEROL SULFATE (2.5 MG/3ML) 0.083% IN NEBU
2.5000 mg | INHALATION_SOLUTION | Freq: Once | RESPIRATORY_TRACT | Status: AC
  Administered 2022-07-27: 2.5 mg via RESPIRATORY_TRACT
  Filled 2022-07-27: qty 3

## 2022-08-05 DIAGNOSIS — D539 Nutritional anemia, unspecified: Secondary | ICD-10-CM | POA: Diagnosis not present

## 2022-08-05 DIAGNOSIS — H1032 Unspecified acute conjunctivitis, left eye: Secondary | ICD-10-CM | POA: Diagnosis not present

## 2022-08-05 DIAGNOSIS — E1140 Type 2 diabetes mellitus with diabetic neuropathy, unspecified: Secondary | ICD-10-CM | POA: Diagnosis not present

## 2022-08-05 DIAGNOSIS — G4730 Sleep apnea, unspecified: Secondary | ICD-10-CM | POA: Diagnosis not present

## 2022-08-05 DIAGNOSIS — E119 Type 2 diabetes mellitus without complications: Secondary | ICD-10-CM | POA: Diagnosis not present

## 2022-08-05 DIAGNOSIS — Z0000 Encounter for general adult medical examination without abnormal findings: Secondary | ICD-10-CM | POA: Diagnosis not present

## 2022-08-05 DIAGNOSIS — M129 Arthropathy, unspecified: Secondary | ICD-10-CM | POA: Diagnosis not present

## 2022-08-05 DIAGNOSIS — Z6831 Body mass index (BMI) 31.0-31.9, adult: Secondary | ICD-10-CM | POA: Diagnosis not present

## 2022-08-05 DIAGNOSIS — R9431 Abnormal electrocardiogram [ECG] [EKG]: Secondary | ICD-10-CM | POA: Diagnosis not present

## 2022-08-05 DIAGNOSIS — Z3200 Encounter for pregnancy test, result unknown: Secondary | ICD-10-CM | POA: Diagnosis not present

## 2022-08-05 DIAGNOSIS — R5383 Other fatigue: Secondary | ICD-10-CM | POA: Diagnosis not present

## 2022-08-05 DIAGNOSIS — R0602 Shortness of breath: Secondary | ICD-10-CM | POA: Diagnosis not present

## 2022-08-10 ENCOUNTER — Encounter: Payer: Self-pay | Admitting: Pulmonary Disease

## 2022-08-10 ENCOUNTER — Ambulatory Visit (INDEPENDENT_AMBULATORY_CARE_PROVIDER_SITE_OTHER): Payer: Medicaid Other | Admitting: Pulmonary Disease

## 2022-08-10 VITALS — BP 120/82 | HR 79 | Temp 97.1°F | Ht 66.5 in | Wt 200.8 lb

## 2022-08-10 DIAGNOSIS — R918 Other nonspecific abnormal finding of lung field: Secondary | ICD-10-CM

## 2022-08-10 DIAGNOSIS — F17210 Nicotine dependence, cigarettes, uncomplicated: Secondary | ICD-10-CM | POA: Diagnosis not present

## 2022-08-10 DIAGNOSIS — R0602 Shortness of breath: Secondary | ICD-10-CM | POA: Diagnosis not present

## 2022-08-10 DIAGNOSIS — J4540 Moderate persistent asthma, uncomplicated: Secondary | ICD-10-CM

## 2022-08-10 MED ORDER — TRELEGY ELLIPTA 100-62.5-25 MCG/ACT IN AEPB: 1.0000 | INHALATION_SPRAY | Freq: Every day | RESPIRATORY_TRACT | 0 refills | Status: DC

## 2022-08-10 MED ORDER — ALBUTEROL SULFATE HFA 108 (90 BASE) MCG/ACT IN AERS: 2.0000 | INHALATION_SPRAY | Freq: Four times a day (QID) | RESPIRATORY_TRACT | 2 refills | Status: DC | PRN

## 2022-08-10 MED ORDER — TRELEGY ELLIPTA 100-62.5-25 MCG/ACT IN AEPB: 1.0000 | INHALATION_SPRAY | Freq: Every day | RESPIRATORY_TRACT | 11 refills | Status: DC

## 2022-08-10 NOTE — Progress Notes (Signed)
Subjective:    Patient ID: Jamie Palmer, female    DOB: 08/07/1985, 37 y.o.   MRN: [id number] [facility]  Chief Complaint  Patient presents with   Follow-up    No SOB or wheezing. Cough with white sputum. Cough worse in the mornings.   HPI This is a 37 year old current smoker (1 PPD,24 PY) with a history as noted below, who presents for follow-up of lung nodules seen on limited imaging on June 2022 and never followed up. Patient was seen in [facility] emergency room on 25 June 2022 with a complaint of chest pain.  She had a chest x-ray that did not show any active disease.  However it was noted that in June 2022 she had had 3 nodules noted on the left upper lobe on limited chest windows on a CT performed for C-spine evaluation.  The patient states that she was told that she needed to have this "checked out".  She was initially seen here on 04 July 2022 for this issue.  At her initial visit here she was given a trial of Advair 250/50 1 inhalation twice a day and instructed to continue as needed albuterol.  She had a dedicated chest CT ordered as well.  This was performed 14 July 2022 and showed that the previously nodular changes noted in the left upper lobe had resolved completely and were likely postinflammatory.  There is a solitary 3 mm left lower lobe nodule and no other significant findings.  The patient was shown these films today.  Patient had pulmonary function testing performed 27 July 2022 that showed an FEV1 of 2.63 L or 80% predicted, FVC of 3.55 L or 89% predicted, FEV1/FVC of 74% lung volumes showed mild hyperinflation and air trapping diffusion capacity was normal, he did have response of bronchodilator by decreased airway resistance.  This is consistent with obstructive airways disease of the asthmatic type.  She has not had any orthopnea, paroxysmal nocturnal dyspnea or lower extremity edema.  No weight loss or anorexia.   No hemoptysis.  She does have some cough productive of white sputum in the mornings however this subsides after that.  She is having difficulty procuring Advair, it appears that her insurance will cover Trelegy.  She does not have rescue inhaler.   Review of Systems A 10 point review of systems was performed and it is as noted above otherwise negative.  Patient Active Problem List   Diagnosis Date Noted   Dizziness 06/10/2015   Bradycardia 02/23/2015   Diabetes mellitus during pregnancy in third trimester 11/26/2014   Gestational diabetes 03/29/2012   SVD (spontaneous vaginal delivery) 03/29/2012   Social History   Tobacco Use   Smoking status: Every Day    Packs/day: 1.00    Years: 24.00    Additional pack years: 0.00    Total pack years: 24.00    Types: Cigarettes   Smokeless tobacco: Never   Tobacco comments:    1 PPD - 08/10/2022 khj  Substance Use Topics   Alcohol use: Yes    Alcohol/week: 0.0 standard drinks of alcohol   No Known Allergies  Current Meds  Medication Sig   gabapentin (NEURONTIN) 100 MG capsule Take 100 mg by mouth in the morning, at noon, and at bedtime.   TRULICITY 0.75 MG/0.5ML SOPN Inject 0.75 mg into the skin once a week.   fluticasone-salmeterol (ADVAIR DISKUS) 250-50 MCG/ACT AEPB Inhale 1 puff into the lungs in  the morning and at bedtime.   Immunization History  Administered Date(s) Administered   DTaP 01/03/2012   Influenza Whole 02/22/2012   Influenza,inj,Quad PF,6+ Mos 04/04/2019       Objective:   Physical Exam BP 120/82 (BP Location: Left Arm, Cuff Size: Normal)   Pulse 79   Temp (!) 97.1 F (36.2 C)   Ht 5' 6.5" (1.689 m)   Wt 200 lb 12.8 oz (91.1 kg)   SpO2 97%   BMI 31.92 kg/m   SpO2: 97 % O2 Device: None (Room air)  GENERAL: Well-developed, overweight woman, no acute distress.  Fully ambulatory, no conversational dyspnea. HEAD: Normocephalic, atraumatic.  EYES: Pupils equal, round, reactive to light.  No scleral  icterus.  MOUTH: Some teeth in poor repair, oral mucosa moist.  No thrush. NECK: Supple. No thyromegaly. Trachea midline. No JVD.  No adenopathy. PULMONARY: Good air entry bilaterally.  There are scattered end expiratory wheezes, otherwise, no adventitious sounds. CARDIOVASCULAR: S1 and S2. Regular rate and rhythm.  No rubs, murmurs or gallops heard. ABDOMEN: No overt focal deficit, no gait disturbance, speech is fluent. MUSCULOSKELETAL: No joint deformity, no clubbing, no edema.  NEUROLOGIC: No overt focal deficit, no gait disturbance, speech is fluent. SKIN: Intact,warm,dry. PSYCH: Mood and behavior normal.      Assessment & Plan:     ICD-10-CM   1. 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    2. SOB (shortness of breath)  R06.02    Likely related to poorly compensated asthmatic bronchitis Plan as above    3. Lung nodules  R91.8    Multiple lung nodules left upper lobe resolved Solitary lung nodule 3 mm remains Follow-up CT chest 1 year Does not meet criteria for lung cancer screening    4. Tobacco dependence due to cigarettes  F17.210    Counseled with regards to discontinuation of smoking Total counseling time 3 to 5 minutes     Meds ordered this encounter  Medications   Fluticasone-Umeclidin-Vilant (TRELEGY ELLIPTA) 100-62.5-25 MCG/ACT AEPB    Sig: Inhale 1 puff into the lungs daily.    Dispense:  14 each    Refill:  0    Order Specific Question:   Lot Number?    Answer:   [id number]    Order Specific Question:   Expiration Date?    Answer:   10/14/2023    Order Specific Question:   Quantity    Answer:   1   albuterol (VENTOLIN HFA) 108 (90 Base) MCG/ACT inhaler    Sig: Inhale 2 puffs into the lungs every 6 (six) hours as needed for wheezing or shortness of breath.    Dispense:  8 g    Refill:  2   Fluticasone-Umeclidin-Vilant (TRELEGY ELLIPTA)  100-62.5-25 MCG/ACT AEPB    Sig: Inhale 1 puff into the lungs daily.    Dispense:  28 each    Refill:  11   Will see the patient in follow-up in 3 to 4 months time she is to contact us prior to that time should any new difficulties arise.  Gailen Shelter. Laura Abeer Iversen, MD Advanced Bronchoscopy PCCM [facility]    *This note was dictated using voice recognition software/Dragon.  Despite best efforts to proofread, errors can occur which can change the meaning. Any transcriptional errors that result from this process are unintentional and may not be fully corrected at the time of  dictation.

## 2022-08-10 NOTE — Patient Instructions (Addendum)
We are changing your Advair to Trelegy Ellipta 100.  Trelegy is 1 puff daily, make sure you rinse your mouth well after you use it.  We are also sending in a prescription for an emergency inhaler (albuterol) this you can use if you feel any chest tightness or shortness of breath during the day.  We will see you in follow-up in 3 to 4 months time call sooner should any new problems arise.

## 2022-08-19 ENCOUNTER — Encounter: Payer: Self-pay | Admitting: Pulmonary Disease

## 2022-08-21 DIAGNOSIS — Z6831 Body mass index (BMI) 31.0-31.9, adult: Secondary | ICD-10-CM | POA: Diagnosis not present

## 2022-08-21 DIAGNOSIS — E7841 Elevated Lipoprotein(a): Secondary | ICD-10-CM | POA: Diagnosis not present

## 2022-08-21 DIAGNOSIS — Z3200 Encounter for pregnancy test, result unknown: Secondary | ICD-10-CM | POA: Diagnosis not present

## 2022-08-21 DIAGNOSIS — E559 Vitamin D deficiency, unspecified: Secondary | ICD-10-CM | POA: Diagnosis not present

## 2022-08-21 DIAGNOSIS — E119 Type 2 diabetes mellitus without complications: Secondary | ICD-10-CM | POA: Diagnosis not present

## 2022-08-21 DIAGNOSIS — E1140 Type 2 diabetes mellitus with diabetic neuropathy, unspecified: Secondary | ICD-10-CM | POA: Diagnosis not present

## 2022-08-21 DIAGNOSIS — J45909 Unspecified asthma, uncomplicated: Secondary | ICD-10-CM | POA: Diagnosis not present

## 2022-08-21 DIAGNOSIS — Z0130 Encounter for examination of blood pressure without abnormal findings: Secondary | ICD-10-CM | POA: Diagnosis not present

## 2022-09-18 DIAGNOSIS — E559 Vitamin D deficiency, unspecified: Secondary | ICD-10-CM | POA: Diagnosis not present

## 2022-09-18 DIAGNOSIS — Z6831 Body mass index (BMI) 31.0-31.9, adult: Secondary | ICD-10-CM | POA: Diagnosis not present

## 2022-09-18 DIAGNOSIS — E7841 Elevated Lipoprotein(a): Secondary | ICD-10-CM | POA: Diagnosis not present

## 2022-09-18 DIAGNOSIS — E119 Type 2 diabetes mellitus without complications: Secondary | ICD-10-CM | POA: Diagnosis not present

## 2022-09-18 DIAGNOSIS — Z3200 Encounter for pregnancy test, result unknown: Secondary | ICD-10-CM | POA: Diagnosis not present

## 2022-09-18 DIAGNOSIS — J45909 Unspecified asthma, uncomplicated: Secondary | ICD-10-CM | POA: Diagnosis not present

## 2022-09-18 DIAGNOSIS — Z0130 Encounter for examination of blood pressure without abnormal findings: Secondary | ICD-10-CM | POA: Diagnosis not present

## 2022-09-18 DIAGNOSIS — M722 Plantar fascial fibromatosis: Secondary | ICD-10-CM | POA: Diagnosis not present

## 2022-09-18 DIAGNOSIS — E1140 Type 2 diabetes mellitus with diabetic neuropathy, unspecified: Secondary | ICD-10-CM | POA: Diagnosis not present

## 2022-10-22 DIAGNOSIS — E119 Type 2 diabetes mellitus without complications: Secondary | ICD-10-CM | POA: Diagnosis not present

## 2022-10-22 DIAGNOSIS — M722 Plantar fascial fibromatosis: Secondary | ICD-10-CM | POA: Diagnosis not present

## 2022-10-22 DIAGNOSIS — Z0130 Encounter for examination of blood pressure without abnormal findings: Secondary | ICD-10-CM | POA: Diagnosis not present

## 2022-10-22 DIAGNOSIS — E7841 Elevated Lipoprotein(a): Secondary | ICD-10-CM | POA: Diagnosis not present

## 2022-10-22 DIAGNOSIS — J45909 Unspecified asthma, uncomplicated: Secondary | ICD-10-CM | POA: Diagnosis not present

## 2022-10-22 DIAGNOSIS — E1140 Type 2 diabetes mellitus with diabetic neuropathy, unspecified: Secondary | ICD-10-CM | POA: Diagnosis not present

## 2022-10-22 DIAGNOSIS — Z3200 Encounter for pregnancy test, result unknown: Secondary | ICD-10-CM | POA: Diagnosis not present

## 2022-10-22 DIAGNOSIS — Z6831 Body mass index (BMI) 31.0-31.9, adult: Secondary | ICD-10-CM | POA: Diagnosis not present

## 2022-10-22 DIAGNOSIS — G4710 Hypersomnia, unspecified: Secondary | ICD-10-CM | POA: Diagnosis not present

## 2022-10-22 DIAGNOSIS — E559 Vitamin D deficiency, unspecified: Secondary | ICD-10-CM | POA: Diagnosis not present

## 2022-11-09 ENCOUNTER — Encounter: Payer: Self-pay | Admitting: *Deleted

## 2022-11-13 ENCOUNTER — Ambulatory Visit: Payer: Medicaid Other | Admitting: Pulmonary Disease

## 2022-11-17 ENCOUNTER — Encounter: Payer: Self-pay | Admitting: Cardiology

## 2022-11-17 ENCOUNTER — Ambulatory Visit: Payer: Medicaid Other | Attending: Cardiology | Admitting: Cardiology

## 2022-11-17 VITALS — BP 120/74 | HR 78 | Ht 66.5 in | Wt 198.6 lb

## 2022-11-17 DIAGNOSIS — R011 Cardiac murmur, unspecified: Secondary | ICD-10-CM

## 2022-11-17 DIAGNOSIS — F17200 Nicotine dependence, unspecified, uncomplicated: Secondary | ICD-10-CM | POA: Diagnosis not present

## 2022-11-17 MED ORDER — NICOTINE 14 MG/24HR TD PT24: 14.0000 mg | MEDICATED_PATCH | Freq: Every day | TRANSDERMAL | 0 refills | Status: AC

## 2022-11-17 MED ORDER — NICOTINE 7 MG/24HR TD PT24: 7.0000 mg | MEDICATED_PATCH | Freq: Every day | TRANSDERMAL | 0 refills | Status: AC

## 2022-11-17 MED ORDER — NICOTINE 21 MG/24HR TD PT24: 21.0000 mg | MEDICATED_PATCH | Freq: Every day | TRANSDERMAL | 0 refills | Status: AC

## 2022-11-17 NOTE — Progress Notes (Signed)
Cardiology Office Note:    Date:  11/17/2022   ID:  Jamie Palmer, DOB January 21, 1986, MRN [id number]  PCP:  Center, [facility]   [facility] Providers Cardiologist:  Debbe Odea, MD     Referring MD: Center, [facility]   Chief Complaint  Patient presents with   New Patient (Initial Visit)    Patient has been seen at [facility] in 2016.  Referred today to re-establish cardiac care for bradycardia.      History of Present Illness:    Jamie Palmer is a 37 y.o. female with a hx of asthma, diabetes, current smoker x 20+ years, presenting with bradycardia.  She was seen by cardiology back in 2016 due to dizziness, cardiac monitor showed no significant arrhythmias.  Noted to be bradycardic associated with likely sleep with heart rate 38, average heart rate 79.  Echo showed normal EF.  Saw primary care provider who recommended patient see cardiology.  She states feeling okay, she still smokes, has occasional shortness of breath associated with moderate persistent asthma.  Being managed by pulmonary medicine.  Denies chest pain, denies edema, denies palpitations.   Past Medical History:  Diagnosis Date   ADHD (attention deficit hyperactivity disorder)    Anxiety    Asthma    Uses inhaler prn   Bradycardia 02/23/2015   Depression    Dizziness 06/10/2015   Gestational diabetes    glyburide   Shingles     Past Surgical History:  Procedure Laterality Date   APPENDECTOMY     FOOT SURGERY     TUBAL LIGATION Bilateral 11/27/2014   Procedure: POST PARTUM TUBAL LIGATION;  Surgeon: Lavina Hamman, MD;  Location: [facility];  Service: Gynecology;  Laterality: Bilateral;    Current Medications: Current Meds  Medication Sig   albuterol (VENTOLIN HFA) 108 (90 Base) MCG/ACT inhaler Inhale 2 puffs into the lungs every 6 (six) hours as needed for wheezing or shortness of breath.   cyclobenzaprine (FLEXERIL) 10 MG tablet Take 1 tablet by mouth 3 (three) times daily as  needed.   Fluticasone-Umeclidin-Vilant (TRELEGY ELLIPTA) 100-62.5-25 MCG/ACT AEPB Inhale 1 puff into the lungs daily.   Fluticasone-Umeclidin-Vilant (TRELEGY ELLIPTA) 100-62.5-25 MCG/ACT AEPB Inhale 1 puff into the lungs daily.   ibuprofen (ADVIL) 600 MG tablet Take 1 tablet (600 mg total) by mouth every 6 (six) hours as needed.   [START ON 12/30/2022] nicotine (NICODERM CQ - DOSED IN MG/24 HOURS) 14 mg/24hr patch Place 1 patch (14 mg total) onto the skin daily for 14 days.   nicotine (NICODERM CQ - DOSED IN MG/24 HOURS) 21 mg/24hr patch Place 1 patch (21 mg total) onto the skin daily.   [START ON 01/14/2023] nicotine (NICODERM CQ - DOSED IN MG/24 HR) 7 mg/24hr patch Place 1 patch (7 mg total) onto the skin daily for 14 days.   TRULICITY 0.75 MG/0.5ML SOPN Inject 0.75 mg into the skin once a week.     Allergies:   Patient has no known allergies.   Social History   Socioeconomic History   Marital status: Single    Spouse name: Not on file   Number of children: Not on file   Years of education: Not on file   Highest education level: Not on file  Occupational History   Not on file  Tobacco Use   Smoking status: Every Day    Packs/day: 1.00    Years: 24.00    Additional pack years: 0.00    Total pack years: 24.00  Types: Cigarettes   Smokeless tobacco: Never   Tobacco comments:    1 PPD - 08/10/2022 khj  Vaping Use   Vaping Use: Never used  Substance and Sexual Activity   Alcohol use: Yes    Alcohol/week: 0.0 standard drinks of alcohol   Drug use: No    Comment: previous drug addiction 7 years ago   Sexual activity: Not Currently    Birth control/protection: Surgical  Other Topics Concern   Not on file  Social History Narrative   Not on file   Social Determinants of Health   Financial Resource Strain: Not on file  Food Insecurity: Not on file  Transportation Needs: Not on file  Physical Activity: Not on file  Stress: Not on file  Social Connections: Not on file      Family History: The patient's family history includes Asthma in an other family member; COPD in an other family member; Cancer in her father, maternal grandfather, maternal grandmother, mother, and another family member; Diabetes in her maternal aunt and another family member; Heart attack in her maternal aunt and another family member; Heart disease in her maternal aunt and another family member; Heart failure in her maternal aunt; Hyperlipidemia in an other family member; Hypertension in her maternal aunt and another family member; Obesity in an other family member. There is no history of Other.  ROS:   Please see the history of present illness.     All other systems reviewed and are negative.  EKGs/Labs/Other Studies Reviewed:    The following studies were reviewed today:  EKG Interpretation Date/Time:  Friday November 17 2022 14:15:46 EDT Ventricular Rate:  78 PR Interval:  168 QRS Duration:  90 QT Interval:  382 QTC Calculation: 435 R Axis:   34  Text Interpretation: Normal sinus rhythm with sinus arrhythmia Possible Left atrial enlargement Confirmed by Debbe Odea ([id number]) on 11/17/2022 2:21:48 PM    Recent Labs: 06/25/2022: BUN 13; Creatinine, Ser 0.62; Hemoglobin 13.0; Platelets 311; Potassium 4.0; Sodium 136  Recent Lipid Panel    Component Value Date/Time   CHOL 199 02/23/2015 0833   TRIG 171 (H) 02/23/2015 0833   HDL 45 (L) 02/23/2015 0833   CHOLHDL 4.4 02/23/2015 0833   VLDL 34 (H) 02/23/2015 0833   LDLCALC 120 02/23/2015 0833     Risk Assessment/Calculations:             Physical Exam:    VS:  BP 120/74 (BP Location: Right Arm, Patient Position: Sitting, Cuff Size: Large)   Pulse 78   Ht 5' 6.5" (1.689 m)   Wt 198 lb 9.6 oz (90.1 kg)   SpO2 98%   BMI 31.57 kg/m     Wt Readings from Last 3 Encounters:  11/17/22 198 lb 9.6 oz (90.1 kg)  08/10/22 200 lb 12.8 oz (91.1 kg)  07/04/22 202 lb (91.6 kg)     GEN:  Well nourished, well developed in no  acute distress HEENT: Normal NECK: No JVD; No carotid bruits CARDIAC: RRR, 2/6 systolic murmur RESPIRATORY:  Clear to auscultation without rales, wheezing or rhonchi  ABDOMEN: Soft, non-tender, non-distended MUSCULOSKELETAL:  No edema; No deformity  SKIN: Warm and dry NEUROLOGIC:  Alert and oriented x 3 PSYCHIATRIC:  Normal affect   ASSESSMENT:    1. Systolic murmur   2. Smoking    PLAN:    In order of problems listed above:  Systolic murmur on exam, obtain echocardiogram to evaluate any significant structural or valvular abnormalities.  EKG  shows sinus rhythm. Current smoker, smoking cessation advised.  Nicotine patch prescribed.  Follow-up after echo.      Medication Adjustments/Labs and Tests Ordered: Current medicines are reviewed at length with the patient today.  Concerns regarding medicines are outlined above.  Orders Placed This Encounter  Procedures   EKG 12-Lead   ECHOCARDIOGRAM COMPLETE   Meds ordered this encounter  Medications   nicotine (NICODERM CQ - DOSED IN MG/24 HOURS) 21 mg/24hr patch    Sig: Place 1 patch (21 mg total) onto the skin daily.    Dispense:  42 patch    Refill:  0   nicotine (NICODERM CQ - DOSED IN MG/24 HOURS) 14 mg/24hr patch    Sig: Place 1 patch (14 mg total) onto the skin daily for 14 days.    Dispense:  14 patch    Refill:  0   nicotine (NICODERM CQ - DOSED IN MG/24 HR) 7 mg/24hr patch    Sig: Place 1 patch (7 mg total) onto the skin daily for 14 days.    Dispense:  14 patch    Refill:  0    Patient Instructions  Medication Instructions:  Your physician has recommended you make the following change in your medication:   nicotine (NICODERM CQ - DOSED IN MG/24 HOURS) Place 1 patch (21 mg total) onto the skin daily., Starting Fri 11/17/2022, Until Fri 12/29/2022 (6 weeks)  nicotine (NICODERM CQ - DOSED IN MG/24 HOURS) Place 1 patch (14 mg total) onto the skin daily for 14 days., Starting Sat 12/30/2022, Until Sat 01/13/2023 (2  weeks)  nicotine (NICODERM CQ - DOSED IN MG/24 HR) Place 1 patch (7 mg total) onto the skin daily for 14 days., Starting Sun 01/14/2023, Until Sun 01/28/2023  *If you need a refill on your cardiac medications before your next appointment, please call your pharmacy*  Lab Work: -None ordered  Testing/Procedures: Your physician has requested that you have an echocardiogram. Echocardiography is a painless test that uses sound waves to create images of your heart. It provides your doctor with information about the size and shape of your heart and how well your heart's chambers and valves are working. This procedure takes approximately one hour. There are no restrictions for this procedure. Please do NOT wear cologne, perfume, aftershave, or lotions (deodorant is allowed). Please arrive 15 minutes prior to your appointment time.    Follow-Up: At [facility], you and your health needs are our priority.  As part of our continuing mission to provide you with exceptional heart care, we have created designated Provider Care Teams.  These Care Teams include your primary Cardiologist (physician) and Advanced Practice Providers (APPs -  Physician Assistants and Nurse Practitioners) who all work together to provide you with the care you need, when you need it.  Your next appointment:   After echocardiogram   Provider:   You may see Debbe Odea, MD or one of the following Advanced Practice Providers on your designated Care Team:   Nicolasa Ducking, NP Eula Listen, PA-C Cadence Fransico Michael, PA-C Charlsie Quest, NP    Other Instructions -None    Signed, Debbe Odea, MD  11/17/2022 2:58 PM    [facility]

## 2022-11-17 NOTE — Patient Instructions (Signed)
Medication Instructions:  Your physician has recommended you make the following change in your medication:   nicotine (NICODERM CQ - DOSED IN MG/24 HOURS) Place 1 patch (21 mg total) onto the skin daily., Starting Fri 11/17/2022, Until Fri 12/29/2022 (6 weeks)  nicotine (NICODERM CQ - DOSED IN MG/24 HOURS) Place 1 patch (14 mg total) onto the skin daily for 14 days., Starting Sat 12/30/2022, Until Sat 01/13/2023 (2 weeks)  nicotine (NICODERM CQ - DOSED IN MG/24 HR) Place 1 patch (7 mg total) onto the skin daily for 14 days., Starting Sun 01/14/2023, Until Sun 01/28/2023  *If you need a refill on your cardiac medications before your next appointment, please call your pharmacy*  Lab Work: -None ordered  Testing/Procedures: Your physician has requested that you have an echocardiogram. Echocardiography is a painless test that uses sound waves to create images of your heart. It provides your doctor with information about the size and shape of your heart and how well your heart's chambers and valves are working. This procedure takes approximately one hour. There are no restrictions for this procedure. Please do NOT wear cologne, perfume, aftershave, or lotions (deodorant is allowed). Please arrive 15 minutes prior to your appointment time.    Follow-Up: At [facility], you and your health needs are our priority.  As part of our continuing mission to provide you with exceptional heart care, we have created designated Provider Care Teams.  These Care Teams include your primary Cardiologist (physician) and Advanced Practice Providers (APPs -  Physician Assistants and Nurse Practitioners) who all work together to provide you with the care you need, when you need it.  Your next appointment:   After echocardiogram   Provider:   You may see Debbe Odea, MD or one of the following Advanced Practice Providers on your designated Care Team:   Nicolasa Ducking, NP Eula Listen, PA-C Cadence Fransico Michael,  PA-C Charlsie Quest, NP    Other Instructions -None

## 2022-11-19 DIAGNOSIS — M722 Plantar fascial fibromatosis: Secondary | ICD-10-CM | POA: Diagnosis not present

## 2022-11-19 DIAGNOSIS — Z3200 Encounter for pregnancy test, result unknown: Secondary | ICD-10-CM | POA: Diagnosis not present

## 2022-11-19 DIAGNOSIS — R5383 Other fatigue: Secondary | ICD-10-CM | POA: Diagnosis not present

## 2022-11-19 DIAGNOSIS — R030 Elevated blood-pressure reading, without diagnosis of hypertension: Secondary | ICD-10-CM | POA: Diagnosis not present

## 2022-11-19 DIAGNOSIS — E119 Type 2 diabetes mellitus without complications: Secondary | ICD-10-CM | POA: Diagnosis not present

## 2022-11-19 DIAGNOSIS — E7841 Elevated Lipoprotein(a): Secondary | ICD-10-CM | POA: Diagnosis not present

## 2022-11-19 DIAGNOSIS — E1140 Type 2 diabetes mellitus with diabetic neuropathy, unspecified: Secondary | ICD-10-CM | POA: Diagnosis not present

## 2022-11-19 DIAGNOSIS — Z6831 Body mass index (BMI) 31.0-31.9, adult: Secondary | ICD-10-CM | POA: Diagnosis not present

## 2022-11-19 DIAGNOSIS — Z79899 Other long term (current) drug therapy: Secondary | ICD-10-CM | POA: Diagnosis not present

## 2022-11-19 DIAGNOSIS — E559 Vitamin D deficiency, unspecified: Secondary | ICD-10-CM | POA: Diagnosis not present

## 2022-11-19 DIAGNOSIS — J45909 Unspecified asthma, uncomplicated: Secondary | ICD-10-CM | POA: Diagnosis not present

## 2022-11-19 DIAGNOSIS — G4710 Hypersomnia, unspecified: Secondary | ICD-10-CM | POA: Diagnosis not present

## 2022-11-19 DIAGNOSIS — E7800 Pure hypercholesterolemia, unspecified: Secondary | ICD-10-CM | POA: Diagnosis not present

## 2022-11-21 ENCOUNTER — Other Ambulatory Visit (HOSPITAL_BASED_OUTPATIENT_CLINIC_OR_DEPARTMENT_OTHER): Payer: Self-pay

## 2022-11-21 DIAGNOSIS — G4710 Hypersomnia, unspecified: Secondary | ICD-10-CM

## 2022-12-14 ENCOUNTER — Ambulatory Visit: Payer: Medicaid Other

## 2022-12-14 DIAGNOSIS — R011 Cardiac murmur, unspecified: Secondary | ICD-10-CM

## 2022-12-14 LAB — ECHOCARDIOGRAM COMPLETE
Area-P 1/2: 2.91 cm2
S' Lateral: 3.4 cm

## 2022-12-16 DIAGNOSIS — E6609 Other obesity due to excess calories: Secondary | ICD-10-CM | POA: Diagnosis not present

## 2022-12-16 DIAGNOSIS — E559 Vitamin D deficiency, unspecified: Secondary | ICD-10-CM | POA: Diagnosis not present

## 2022-12-16 DIAGNOSIS — J45909 Unspecified asthma, uncomplicated: Secondary | ICD-10-CM | POA: Diagnosis not present

## 2022-12-16 DIAGNOSIS — E7800 Pure hypercholesterolemia, unspecified: Secondary | ICD-10-CM | POA: Diagnosis not present

## 2022-12-16 DIAGNOSIS — Z6832 Body mass index (BMI) 32.0-32.9, adult: Secondary | ICD-10-CM | POA: Diagnosis not present

## 2022-12-16 DIAGNOSIS — E119 Type 2 diabetes mellitus without complications: Secondary | ICD-10-CM | POA: Diagnosis not present

## 2022-12-16 DIAGNOSIS — E7841 Elevated Lipoprotein(a): Secondary | ICD-10-CM | POA: Diagnosis not present

## 2022-12-16 DIAGNOSIS — R0683 Snoring: Secondary | ICD-10-CM | POA: Diagnosis not present

## 2022-12-16 DIAGNOSIS — E1140 Type 2 diabetes mellitus with diabetic neuropathy, unspecified: Secondary | ICD-10-CM | POA: Diagnosis not present

## 2022-12-16 DIAGNOSIS — G4710 Hypersomnia, unspecified: Secondary | ICD-10-CM | POA: Diagnosis not present

## 2022-12-16 DIAGNOSIS — Z3200 Encounter for pregnancy test, result unknown: Secondary | ICD-10-CM | POA: Diagnosis not present

## 2022-12-22 ENCOUNTER — Ambulatory Visit: Payer: Medicaid Other | Attending: Cardiology | Admitting: Cardiology

## 2022-12-22 ENCOUNTER — Encounter: Payer: Self-pay | Admitting: Cardiology

## 2022-12-22 NOTE — Progress Notes (Deleted)
  Cardiology Office Note:  .   Date:  12/22/2022  ID:  Jamie Palmer, DOB [birth year], MRN [id number] PCP: [facility], [facility]  [facility] Providers Cardiologist:  Debbe Odea, MD { Click to update primary MD,subspecialty MD or APP then REFRESH:1}   History of Present Illness: .   Jamie Palmer is a 37 y.o. female with past medical history of asthma, diabetes, current smoker x 20+ years, heart murmur, and bradycardia, who is here today for follow-up.  She was last seen in clinic 11/17/2022 by Dr.Agbor-Etang.  She was reestablishing care for bradycardia and dizziness.  She was previously seeing cardiology back in 2016 and had a cardiac monitor which revealed no significant arrhythmias.  Noted to be bradycardic associated with likely sleep with heart rate of 38 with average heart rate 79.  Echocardiogram showed normal EF.  So primary care provider recommended she follow back up with cardiology.  She was feeling okay, still smoke, and some occasional shortness of breath associated with moderate persistent asthma.  That was being managed by pulmonary medicine.  She denies any chest pain or palpitations.  She was scheduled for an echocardiogram.  ROS: ***  Studies Reviewed: Marland Kitchen       TTE 12/14/22 1. Left ventricular ejection fraction, by estimation, is 60 to 65%. The  left ventricle has normal function. The left ventricle has no regional  wall motion abnormalities. Left ventricular diastolic parameters were  normal. The average left ventricular  global longitudinal strain is -25.1 %.   2. Right ventricular systolic function is normal. The right ventricular  size is normal.   3. The mitral valve is normal in structure. Mild mitral valve  regurgitation. No evidence of mitral stenosis.   4. The aortic valve has an indeterminant number of cusps. Aortic valve  regurgitation is not well visualized, estimated at least moderate to  severe. Consider TEE for better visualization. No  aortic stenosis is  present.   5. The inferior vena cava is normal in size with greater than 50%  respiratory variability, suggesting right atrial pressure of 3 mmHg.   TTE 03/10/2015 - Left ventricle: The cavity size was normal. Systolic function was    normal. The estimated ejection fraction was in the range of 60%    to 65%. Wall motion was normal; there were no regional wall    motion abnormalities.  - Mitral valve: There was trivial regurgitation.  - Tricuspid valve: There was trivial regurgitation.    Risk Assessment/Calculations:     No BP recorded.  {Refresh Note OR Click here to enter BP  :1}***       Physical Exam:   VS:  There were no vitals taken for this visit.   Wt Readings from Last 3 Encounters:  11/17/22 198 lb 9.6 oz (90.1 kg)  08/10/22 200 lb 12.8 oz (91.1 kg)  07/04/22 202 lb (91.6 kg)    GEN: Well nourished, well developed in no acute distress NECK: No JVD; No carotid bruits CARDIAC: ***RRR, no murmurs, rubs, gallops RESPIRATORY:  Clear to auscultation without rales, wheezing or rhonchi  ABDOMEN: Soft, non-tender, non-distended EXTREMITIES:  No edema; No deformity   ASSESSMENT AND PLAN: .   ***    {Are you ordering a CV Procedure (e.g. stress test, cath, DCCV, TEE, etc)?   Press F2        :[id number]}  Dispo: ***  Signed,  , NP

## 2023-01-11 ENCOUNTER — Ambulatory Visit (HOSPITAL_BASED_OUTPATIENT_CLINIC_OR_DEPARTMENT_OTHER): Payer: Medicaid Other | Admitting: Internal Medicine

## 2023-01-11 VITALS — Ht 66.0 in | Wt 202.0 lb

## 2023-01-11 DIAGNOSIS — G4733 Obstructive sleep apnea (adult) (pediatric): Secondary | ICD-10-CM | POA: Insufficient documentation

## 2023-01-11 DIAGNOSIS — G4710 Hypersomnia, unspecified: Secondary | ICD-10-CM

## 2023-01-11 DIAGNOSIS — R0683 Snoring: Secondary | ICD-10-CM | POA: Diagnosis present

## 2023-01-15 DIAGNOSIS — G4710 Hypersomnia, unspecified: Secondary | ICD-10-CM

## 2023-01-15 DIAGNOSIS — G4730 Sleep apnea, unspecified: Secondary | ICD-10-CM | POA: Diagnosis not present

## 2023-01-15 NOTE — Procedures (Signed)
     Patient Name: Jamie Palmer, Jamie Palmer Date: 01/11/2023 Gender: Female D.O.B: 1986-03-13 Age (years): 37 Referring Provider: Saundra Shelling NP Height (inches): 67 Interpreting Physician: Jetty Duhamel MD, ABSM Weight (lbs): 202 RPSGT: Lowry Ram BMI: 32 MRN: [id number] Neck Size: 15.50  CLINICAL INFORMATION Sleep Study Type: NPSG Indication for sleep study: Diabetes, Obesity, Snoring Epworth Sleepiness Score: 15  SLEEP STUDY TECHNIQUE As per the AASM Manual for the Scoring of Sleep and Associated Events v2.3 (April 2016) with a hypopnea requiring 4% desaturations.  The channels recorded and monitored were frontal, central and occipital EEG, electrooculogram (EOG), submentalis EMG (chin), nasal and oral airflow, thoracic and abdominal wall motion, anterior tibialis EMG, snore microphone, electrocardiogram, and pulse oximetry.  MEDICATIONS Medications self-administered by patient taken the night of the study : ATORVASTATIN, Ibuprofen PM  SLEEP ARCHITECTURE The study was initiated at 10:17:59 PM and ended at 4:44:22 AM.  Sleep onset time was 1.2 minutes and the sleep efficiency was 96.3%. The total sleep time was 372 minutes.  Stage REM latency was 130.5 minutes.  The patient spent 3.9% of the night in stage N1 sleep, 81.2% in stage N2 sleep, 0.0% in stage N3 and 14.9% in REM.  Alpha intrusion was absent.  Supine sleep was 46.94%.  RESPIRATORY PARAMETERS The overall apnea/hypopnea index (AHI) was 5.6 per hour. There were 11 total apneas, including 11 obstructive, 0 central and 0 mixed apneas. There were 24 hypopneas and 15 RERAs.  The AHI during Stage REM sleep was 28.1 per hour.  AHI while supine was 10.7 per hour.  The mean oxygen saturation was 95.6%. The minimum SpO2 during sleep was 86.0%.  moderate snoring was noted during this study.  CARDIAC DATA The 2 lead EKG demonstrated sinus rhythm. The mean heart rate was 65.7 beats per minute. Other EKG  findings include: None.  LEG MOVEMENT DATA The total PLMS were 0 with a resulting PLMS index of 0.0. Associated arousal with leg movement index was 0.0 .  IMPRESSIONS - Minimal obstructive sleep apnea occurred during this study (AHI = 5.6/h). - Mild oxygen desaturation was noted during this study (Min O2 = 86.0%, Mean 95.6%). - The patient snored with moderate snoring volume. - No cardiac abnormalities were noted during this study. - Clinically significant periodic limb movements did not occur during sleep. No significant associated arousals.  DIAGNOSIS - Obstructive Sleep Apnea (G47.33)  RECOMMENDATIONS - Treatment for mild OSA is guided by symptoms and co-morbidity. Conservative measures might include observation, weight loss, a chin strap and sleep position off back. Other measures, including CPAP, a fitted oral appliance, or ENT evaluation, would be based on clinical judgment. - Be careful with alcohol, sedatives and other CNS depressants that may worsen sleep apnea and disrupt normal sleep architecture. - Sleep hygiene should be reviewed to assess factors that may improve sleep quality. - Weight management and regular exercise should be initiated or continued if appropriate.  [Electronically signed] 01/15/2023 12:43 PM  Jetty Duhamel MD, ABSM Diplomate, American Board of Sleep Medicine NPI: [id number]                        Jetty Duhamel Diplomate, American Board of Sleep Medicine  ELECTRONICALLY SIGNED ON:  01/15/2023, 12:37 PM [facility] PH: [phone]   FX: [phone] ACCREDITED BY THE AMERICAN ACADEMY OF SLEEP MEDICINE

## 2023-01-28 NOTE — Progress Notes (Unsigned)
Cardiology Office Note    Date:  01/31/2023   ID:  Jamie Palmer, DOB December 26, 1985, MRN [id number]  PCP:  [facility]  Cardiologist:  Debbe Odea, MD  Electrophysiologist:  None   Chief Complaint: Follow-up  History of Present Illness:   Jamie Palmer is a 37 y.o. 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  She was evaluated by cardiology in 2016 for dizziness.  24-hour Holter that time showed a predominant rhythm of sinus with sinus arrhythmia with an average rate of 79 bpm (range 38 at 6 AM to 125 bpm), 0 pauses of greater than 2.5 seconds, 17 isolated PVCs, and 59 isolated PACs.  Echo in 2016 showed an EF of 60 to 65%, no regional wall motion abnormalities, and trivial MR/TR.  She reestablished care with cardiology in 11/2022 at the request of her PCP.  At that time, she was without symptoms of angina or cardiac decompensation with some occasional shortness of breath noted.  She continued to smoke.  Given murmur noted on exam, she underwent echo in 12/2022 demonstrated an EF of 60 to 65%, no regional wall motion abnormalities, normal LV diastolic function parameters, normal RV systolic function and ventricular cavity size, mild mitral regurgitation, and at least moderate to severe aortic insufficiency with indeterminate number of aortic valve cusps and no evidence of aortic valve stenosis with recommendation to consider TEE for better visualization of the aortic valve.  Aorta documented to be normal in size and structure.  She comes in doing well from a cardiac perspective and is without symptoms of angina or cardiac decompensation.  No dizziness, presyncope, or syncope.  No lower extremity swelling or progressive orthopnea.  She has cut out tobacco use and occasionally vapes.  Reports that she was told she had a murmur during childhood.  Reports having undergone tubal  ligation after her third child.  No issues with dysphagia or prior thoracic surgery.   Labs independently reviewed: 06/2022 - Hgb 13.0, PLT 311, potassium 4.0, BUN 13, serum creatinine 0.62 09/2019 - albumin 3.8, AST/ALT normal 02/2015 - TSH normal, TC 199, TG 171, HDL 45, LDL 120  Past Medical History:  Diagnosis Date   ADHD (attention deficit hyperactivity disorder)    Anxiety    Asthma    Uses inhaler prn   Bradycardia 02/23/2015   Depression    Dizziness 06/10/2015   Gestational diabetes    glyburide   Shingles     Past Surgical History:  Procedure Laterality Date   APPENDECTOMY     FOOT SURGERY     TUBAL LIGATION Bilateral 11/27/2014   Procedure: POST PARTUM TUBAL LIGATION;  Surgeon: Lavina Hamman, MD;  Location: [facility];  Service: Gynecology;  Laterality: Bilateral;    Current Medications: Current Meds  Medication Sig   albuterol (VENTOLIN HFA) 108 (90 Base) MCG/ACT inhaler Inhale 2 puffs into the lungs every 6 (six) hours as needed for wheezing or shortness of breath.   atorvastatin (LIPITOR) 40 MG tablet Take 40 mg by mouth daily.   cyclobenzaprine (FLEXERIL) 10 MG tablet Take 1 tablet by mouth 3 (three) times daily as needed.   Fluticasone-Umeclidin-Vilant (TRELEGY ELLIPTA) 100-62.5-25 MCG/ACT AEPB Inhale 1 puff into the lungs daily.   gabapentin (NEURONTIN) 100 MG capsule Take 100 mg by mouth as needed.   MOUNJARO 2.5 MG/0.5ML Pen Inject into the skin once a week.   Vitamin  D, Ergocalciferol, (DRISDOL) 1.25 MG (50000 UNIT) CAPS capsule Take 50,000 Units by mouth once a week.    Allergies:   Patient has no known allergies.   Social History   Socioeconomic History   Marital status: Single    Spouse name: Not on file   Number of children: Not on file   Years of education: Not on file   Highest education level: Not on file  Occupational History   Not on file  Tobacco Use   Smoking status: Former    Current packs/day: 1.00    Average packs/day: 1 pack/day for  24.0 years (24.0 ttl pk-yrs)    Types: Cigarettes   Smokeless tobacco: Never   Tobacco comments:    1 PPD - 08/10/2022 khj  Vaping Use   Vaping status: Every Day  Substance and Sexual Activity   Alcohol use: Yes    Alcohol/week: 0.0 standard drinks of alcohol   Drug use: No    Comment: previous drug addiction 7 years ago   Sexual activity: Not Currently    Birth control/protection: Surgical  Other Topics Concern   Not on file  Social History Narrative   Not on file   Social Determinants of Health   Financial Resource Strain: Low Risk  (03/23/2021)   [facility] from [facility], [facility]   Overall Financial Resource Strain (CARDIA)    Difficulty of Paying Living Expenses: Not very hard  Food Insecurity: Not on file  Transportation Needs: Not on file  Physical Activity: Not on file  Stress: No Stress Concern Present (03/23/2021)   Received from [facility], [facility]   [employer] of Occupational Health - Occupational Stress Questionnaire    Feeling of Stress : Only a little  Social Connections: Not on file     Family History:  The patient's family history includes Asthma in an other family member; COPD in an other family member; Cancer in her father, maternal grandfather, maternal grandmother, mother, and another family member; Diabetes in her maternal aunt and another family member; Heart attack in her maternal aunt and another family member; Heart disease in her maternal aunt and another family member; Heart failure in her maternal aunt; Hyperlipidemia in an other family member; Hypertension in her maternal aunt and another family member; Obesity in an other family member. There is no history of Other.  ROS:   12-point review of systems is negative unless otherwise noted in the HPI.   EKGs/Labs/Other Studies Reviewed:    Studies reviewed were summarized above. The additional studies were reviewed today:  2D echo 12/14/2022: 1. Left ventricular ejection  fraction, by estimation, is 60 to 65%. The  left ventricle has normal function. The left ventricle has no regional  wall motion abnormalities. Left ventricular diastolic parameters were  normal. The average left ventricular  global longitudinal strain is -25.1 %.   2. Right ventricular systolic function is normal. The right ventricular  size is normal.   3. The mitral valve is normal in structure. Mild mitral valve  regurgitation. No evidence of mitral stenosis.   4. The aortic valve has an indeterminant number of cusps. Aortic valve  regurgitation is not well visualized, estimated at least moderate to  severe. Consider TEE for better visualization. No aortic stenosis is  present.   5. The inferior vena cava is normal in size with greater than 50%  respiratory variability, suggesting right atrial pressure of 3 mmHg.  __________  2D echo 03/10/2015: - Left ventricle: The cavity  size was normal. Systolic function was    normal. The estimated ejection fraction was in the range of 60%    to 65%. Wall motion was normal; there were no regional wall    motion abnormalities.  - Mitral valve: There was trivial regurgitation.  - Tricuspid valve: There was trivial regurgitation.  __________  24-hour Holter monitor 02/2015: Quality: Fair.  Baseline artifact. Predominant rhythm: sinus with sinus arrhythmia Average heart rate: 79 bpm Max heart rate: 125 bpm Min heart rate: 38 bpm Pauses >2.5 seconds: 0 17 isolated PVCs 59 isolated PACs   Patient did not submit a symptom diary   EKG:  EKG is not ordered today.    Recent Labs: 06/25/2022: BUN 13; Creatinine, Ser 0.62; Hemoglobin 13.0; Platelets 311; Potassium 4.0; Sodium 136  Recent Lipid Panel    Component Value Date/Time   CHOL 199 02/23/2015 0833   TRIG 171 (H) 02/23/2015 0833   HDL 45 (L) 02/23/2015 0833   CHOLHDL 4.4 02/23/2015 0833   VLDL 34 (H) 02/23/2015 0833   LDLCALC 120 02/23/2015 0833    PHYSICAL EXAM:    VS:  BP  138/88 (BP Location: Left Arm, Patient Position: Sitting, Cuff Size: Normal)   Pulse 88   Ht 5\' 6"  (1.676 m)   Wt 209 lb (94.8 kg)   SpO2 98%   BMI 33.73 kg/m   BMI: Body mass index is 33.73 kg/m.  Physical Exam Vitals reviewed.  Constitutional:      Appearance: She is well-developed.  HENT:     Head: Normocephalic and atraumatic.  Eyes:     General:        Right eye: No discharge.        Left eye: No discharge.  Cardiovascular:     Rate and Rhythm: Normal rate and regular rhythm.     Heart sounds: S1 normal and S2 normal. Heart sounds not distant. No midsystolic click and no opening snap. Murmur heard.     High-pitched blowing decrescendo early diastolic murmur is present with a grade of 2/4 at the upper right sternal border radiating to the apex.     No friction rub.  Pulmonary:     Effort: Pulmonary effort is normal. No respiratory distress.     Breath sounds: Normal breath sounds. No decreased breath sounds, wheezing or rales.  Chest:     Chest wall: No tenderness.  Abdominal:     General: There is no distension.  Musculoskeletal:     Cervical back: Normal range of motion.  Skin:    General: Skin is warm and dry.     Nails: There is no clubbing.  Neurological:     Mental Status: She is alert and oriented to person, place, and time.  Psychiatric:        Speech: Speech normal.        Behavior: Behavior normal.        Thought Content: Thought content normal.        Judgment: Judgment normal.     Wt Readings from Last 3 Encounters:  01/31/23 209 lb (94.8 kg)  01/11/23 202 lb (91.6 kg)  11/17/22 198 lb 9.6 oz (90.1 kg)     ASSESSMENT & PLAN:   Moderate to possibly severe aortic valve insufficiency: Asymptomatic.  No evidence of LV dysfunction on transthoracic echo.  Indeterminate number of aortic valve cusps on surface echo.  Schedule TEE for further evaluation of the aortic valve anatomy and insufficiency.  May need to consider cardiac  MRI.  No evidence of aortic  root dilatation.  She underwent tubal ligation following the birth of her third child.  Tobacco use: No longer smoking tobacco.  Vapes.  Complete cessation is encouraged moving forward.   Informed Consent   Shared Decision Making/Informed Consent{  The risks [esophageal damage, perforation (1:10,000 risk), bleeding, pharyngeal hematoma as well as other potential complications associated with conscious sedation including aspiration, arrhythmia, respiratory failure and death], benefits (treatment guidance and diagnostic support) and alternatives of a transesophageal echocardiogram were discussed in detail with Ms. Condor and she is willing to proceed.         Disposition: F/u with Dr. Azucena Cecil or an APP after TEE.   Medication Adjustments/Labs and Tests Ordered: Current medicines are reviewed at length with the patient today.  Concerns regarding medicines are outlined above. Medication changes, Labs and Tests ordered today are summarized above and listed in the Patient Instructions accessible in Encounters.   Signed, Eula Listen, PA-C 01/31/2023 4:45 PM     [facility] [place] [address] [place], [place] [address] [phone]

## 2023-01-28 NOTE — H&P (View-Only) (Signed)
Cardiology Office Note    Date:  01/31/2023   ID:  Jamie Palmer, DOB Feb 02, 1986, MRN [id number]  PCP:  [facility]  Cardiologist:  Debbe Odea, MD  Electrophysiologist:  None   Chief Complaint: Follow-up  History of Present Illness:   Jamie Palmer is a 37 y.o. 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  She was evaluated by cardiology in 2016 for dizziness.  24-hour Holter that time showed a predominant rhythm of sinus with sinus arrhythmia with an average rate of 79 bpm (range 38 at 6 AM to 125 bpm), 0 pauses of greater than 2.5 seconds, 17 isolated PVCs, and 59 isolated PACs.  Echo in 2016 showed an EF of 60 to 65%, no regional wall motion abnormalities, and trivial MR/TR.  She reestablished care with cardiology in 11/2022 at the request of her PCP.  At that time, she was without symptoms of angina or cardiac decompensation with some occasional shortness of breath noted.  She continued to smoke.  Given murmur noted on exam, she underwent echo in 12/2022 demonstrated an EF of 60 to 65%, no regional wall motion abnormalities, normal LV diastolic function parameters, normal RV systolic function and ventricular cavity size, mild mitral regurgitation, and at least moderate to severe aortic insufficiency with indeterminate number of aortic valve cusps and no evidence of aortic valve stenosis with recommendation to consider TEE for better visualization of the aortic valve.  Aorta documented to be normal in size and structure.  She comes in doing well from a cardiac perspective and is without symptoms of angina or cardiac decompensation.  No dizziness, presyncope, or syncope.  No lower extremity swelling or progressive orthopnea.  She has cut out tobacco use and occasionally vapes.  Reports that she was told she had a murmur during childhood.  Reports having undergone tubal  ligation after her third child.  No issues with dysphagia or prior thoracic surgery.   Labs independently reviewed: 06/2022 - Hgb 13.0, PLT 311, potassium 4.0, BUN 13, serum creatinine 0.62 09/2019 - albumin 3.8, AST/ALT normal 02/2015 - TSH normal, TC 199, TG 171, HDL 45, LDL 120  Past Medical History:  Diagnosis Date   ADHD (attention deficit hyperactivity disorder)    Anxiety    Asthma    Uses inhaler prn   Bradycardia 02/23/2015   Depression    Dizziness 06/10/2015   Gestational diabetes    glyburide   Shingles     Past Surgical History:  Procedure Laterality Date   APPENDECTOMY     FOOT SURGERY     TUBAL LIGATION Bilateral 11/27/2014   Procedure: POST PARTUM TUBAL LIGATION;  Surgeon: Lavina Hamman, MD;  Location: [facility];  Service: Gynecology;  Laterality: Bilateral;    Current Medications: Current Meds  Medication Sig   albuterol (VENTOLIN HFA) 108 (90 Base) MCG/ACT inhaler Inhale 2 puffs into the lungs every 6 (six) hours as needed for wheezing or shortness of breath.   atorvastatin (LIPITOR) 40 MG tablet Take 40 mg by mouth daily.   cyclobenzaprine (FLEXERIL) 10 MG tablet Take 1 tablet by mouth 3 (three) times daily as needed.   Fluticasone-Umeclidin-Vilant (TRELEGY ELLIPTA) 100-62.5-25 MCG/ACT AEPB Inhale 1 puff into the lungs daily.   gabapentin (NEURONTIN) 100 MG capsule Take 100 mg by mouth as needed.   MOUNJARO 2.5 MG/0.5ML Pen Inject into the skin once a week.   Vitamin  D, Ergocalciferol, (DRISDOL) 1.25 MG (50000 UNIT) CAPS capsule Take 50,000 Units by mouth once a week.    Allergies:   Patient has no known allergies.   Social History   Socioeconomic History   Marital status: Single    Spouse name: Not on file   Number of children: Not on file   Years of education: Not on file   Highest education level: Not on file  Occupational History   Not on file  Tobacco Use   Smoking status: Former    Current packs/day: 1.00    Average packs/day: 1 pack/day for  24.0 years (24.0 ttl pk-yrs)    Types: Cigarettes   Smokeless tobacco: Never   Tobacco comments:    1 PPD - 08/10/2022 khj  Vaping Use   Vaping status: Every Day  Substance and Sexual Activity   Alcohol use: Yes    Alcohol/week: 0.0 standard drinks of alcohol   Drug use: No    Comment: previous drug addiction 7 years ago   Sexual activity: Not Currently    Birth control/protection: Surgical  Other Topics Concern   Not on file  Social History Narrative   Not on file   Social Determinants of Health   Financial Resource Strain: Low Risk  (03/23/2021)   Received from [facility], [facility]   Overall Financial Resource Strain (CARDIA)    Difficulty of Paying Living Expenses: Not very hard  Food Insecurity: Not on file  Transportation Needs: Not on file  Physical Activity: Not on file  Stress: No Stress Concern Present (03/23/2021)   Received from [facility], [facility]   [employer] of Occupational Health - Occupational Stress Questionnaire    Feeling of Stress : Only a little  Social Connections: Not on file     Family History:  The patient's family history includes Asthma in an other family member; COPD in an other family member; Cancer in her father, maternal grandfather, maternal grandmother, mother, and another family member; Diabetes in her maternal aunt and another family member; Heart attack in her maternal aunt and another family member; Heart disease in her maternal aunt and another family member; Heart failure in her maternal aunt; Hyperlipidemia in an other family member; Hypertension in her maternal aunt and another family member; Obesity in an other family member. There is no history of Other.  ROS:   12-point review of systems is negative unless otherwise noted in the HPI.   EKGs/Labs/Other Studies Reviewed:    Studies reviewed were summarized above. The additional studies were reviewed today:  2D echo 12/14/2022: 1. Left ventricular ejection  fraction, by estimation, is 60 to 65%. The  left ventricle has normal function. The left ventricle has no regional  wall motion abnormalities. Left ventricular diastolic parameters were  normal. The average left ventricular  global longitudinal strain is -25.1 %.   2. Right ventricular systolic function is normal. The right ventricular  size is normal.   3. The mitral valve is normal in structure. Mild mitral valve  regurgitation. No evidence of mitral stenosis.   4. The aortic valve has an indeterminant number of cusps. Aortic valve  regurgitation is not well visualized, estimated at least moderate to  severe. Consider TEE for better visualization. No aortic stenosis is  present.   5. The inferior vena cava is normal in size with greater than 50%  respiratory variability, suggesting right atrial pressure of 3 mmHg.  __________  2D echo 03/10/2015: - Left ventricle: The cavity  size was normal. Systolic function was    normal. The estimated ejection fraction was in the range of 60%    to 65%. Wall motion was normal; there were no regional wall    motion abnormalities.  - Mitral valve: There was trivial regurgitation.  - Tricuspid valve: There was trivial regurgitation.  __________  24-hour Holter monitor 02/2015: Quality: Fair.  Baseline artifact. Predominant rhythm: sinus with sinus arrhythmia Average heart rate: 79 bpm Max heart rate: 125 bpm Min heart rate: 38 bpm Pauses >2.5 seconds: 0 17 isolated PVCs 59 isolated PACs   Patient did not submit a symptom diary   EKG:  EKG is not ordered today.    Recent Labs: 06/25/2022: BUN 13; Creatinine, Ser 0.62; Hemoglobin 13.0; Platelets 311; Potassium 4.0; Sodium 136  Recent Lipid Panel    Component Value Date/Time   CHOL 199 02/23/2015 0833   TRIG 171 (H) 02/23/2015 0833   HDL 45 (L) 02/23/2015 0833   CHOLHDL 4.4 02/23/2015 0833   VLDL 34 (H) 02/23/2015 0833   LDLCALC 120 02/23/2015 0833    PHYSICAL EXAM:    VS:  BP  138/88 (BP Location: Left Arm, Patient Position: Sitting, Cuff Size: Normal)   Pulse 88   Ht 5\' 6"  (1.676 m)   Wt 209 lb (94.8 kg)   SpO2 98%   BMI 33.73 kg/m   BMI: Body mass index is 33.73 kg/m.  Physical Exam Vitals reviewed.  Constitutional:      Appearance: She is well-developed.  HENT:     Head: Normocephalic and atraumatic.  Eyes:     General:        Right eye: No discharge.        Left eye: No discharge.  Cardiovascular:     Rate and Rhythm: Normal rate and regular rhythm.     Heart sounds: S1 normal and S2 normal. Heart sounds not distant. No midsystolic click and no opening snap. Murmur heard.     High-pitched blowing decrescendo early diastolic murmur is present with a grade of 2/4 at the upper right sternal border radiating to the apex.     No friction rub.  Pulmonary:     Effort: Pulmonary effort is normal. No respiratory distress.     Breath sounds: Normal breath sounds. No decreased breath sounds, wheezing or rales.  Chest:     Chest wall: No tenderness.  Abdominal:     General: There is no distension.  Musculoskeletal:     Cervical back: Normal range of motion.  Skin:    General: Skin is warm and dry.     Nails: There is no clubbing.  Neurological:     Mental Status: She is alert and oriented to person, place, and time.  Psychiatric:        Speech: Speech normal.        Behavior: Behavior normal.        Thought Content: Thought content normal.        Judgment: Judgment normal.     Wt Readings from Last 3 Encounters:  01/31/23 209 lb (94.8 kg)  01/11/23 202 lb (91.6 kg)  11/17/22 198 lb 9.6 oz (90.1 kg)     ASSESSMENT & PLAN:   Moderate to possibly severe aortic valve insufficiency: Asymptomatic.  No evidence of LV dysfunction on transthoracic echo.  Indeterminate number of aortic valve cusps on surface echo.  Schedule TEE for further evaluation of the aortic valve anatomy and insufficiency.  May need to consider cardiac  MRI.  No evidence of aortic  root dilatation.  She underwent tubal ligation following the birth of her third child.  Tobacco use: No longer smoking tobacco.  Vapes.  Complete cessation is encouraged moving forward.   Informed Consent   Shared Decision Making/Informed Consent{  The risks [esophageal damage, perforation (1:10,000 risk), bleeding, pharyngeal hematoma as well as other potential complications associated with conscious sedation including aspiration, arrhythmia, respiratory failure and death], benefits (treatment guidance and diagnostic support) and alternatives of a transesophageal echocardiogram were discussed in detail with Ms. Corle and she is willing to proceed.         Disposition: F/u with Dr. Azucena Cecil or an APP after TEE.   Medication Adjustments/Labs and Tests Ordered: Current medicines are reviewed at length with the patient today.  Concerns regarding medicines are outlined above. Medication changes, Labs and Tests ordered today are summarized above and listed in the Patient Instructions accessible in Encounters.   Signed, Eula Listen, PA-C 01/31/2023 4:45 PM     [facility] - [place] [address] [place], [place] [address] [phone]

## 2023-01-31 ENCOUNTER — Encounter: Payer: Self-pay | Admitting: Physician Assistant

## 2023-01-31 ENCOUNTER — Ambulatory Visit: Payer: Medicaid Other | Attending: Cardiology | Admitting: Physician Assistant

## 2023-01-31 VITALS — BP 138/88 | HR 88 | Ht 66.0 in | Wt 209.0 lb

## 2023-01-31 DIAGNOSIS — Z87891 Personal history of nicotine dependence: Secondary | ICD-10-CM | POA: Diagnosis not present

## 2023-01-31 DIAGNOSIS — I351 Nonrheumatic aortic (valve) insufficiency: Secondary | ICD-10-CM | POA: Diagnosis not present

## 2023-01-31 NOTE — Patient Instructions (Addendum)
Medication Instructions:  Your Physician recommend you continue on your current medication as directed.     *If you need a refill on your cardiac medications before your next appointment, please call your pharmacy*   Lab Work: Your provider would like for you to have following labs drawn as soon as possible CBC and BMP.   If you have labs (blood work) drawn today and your tests are completely normal, you will receive your results only by: MyChart Message (if you have MyChart) OR A paper copy in the mail If you have any lab test that is abnormal or we need to change your treatment, we will call you to review the results.   Testing/Procedures:     Dear Jamie Palmer  You are scheduled for a TEE (Transesophageal Echocardiogram) on Monday, September 30 with Dr. Debbe Odea, MD.  Please arrive at the [facility] Entrance of [facility], [address], [place] [address] at 6:30 AM (This is 1 hour(s) prior to your procedure time).  Proceed to the Check-In Desk directly inside the entrance.  Procedure Parking: Use the entrance off of the [address] side of the hospital. Turn right upon entering and follow the driveway to parking that is directly in front of the Heart & Vascular Center. There is no valet parking available at this entrance, however there is an awning directly in front of the Heart & Vascular Center for drop off/ pick up for patients.    DIET:  Nothing to eat or drink after midnight except a sip of water with medications (see medication instructions below)  MEDICATION INSTRUCTIONS:  HOLD: Tirzepatide (Mounjaro, Zepbound) AND Trulicity for 7 day prior to the procedure. Last dose on Sunday, February 04, 2023.  LABS: Carry your paper work to the [facility] facility at: [address], [place], [place] [address] 8 am to 5 pm and they are closed for lunch from 12 to 1   FYI:  For your safety, and to allow [place] to monitor your vital signs accurately during  the surgery/procedure we request: If you have artificial nails, gel coating, SNS etc, please have those removed prior to your surgery/procedure. Not having the nail coverings /polish removed may result in cancellation or delay of your surgery/procedure.  You must have a responsible person to drive you home and stay in the waiting area during your procedure. Failure to do so could result in cancellation.  Bring your insurance cards.  *Special Note: Every effort is made to have your procedure done on time. Occasionally there are emergencies that occur at the hospital that may cause delays. Please be patient if a delay does occur.     Follow-Up: At [facility], you and your health needs are our priority.  As part of our continuing mission to provide you with exceptional heart care, we have created designated Provider Care Teams.  These Care Teams include your primary Cardiologist (physician) and Advanced Practice Providers (APPs -  Physician Assistants and Nurse Practitioners) who all work together to provide you with the care you need, when you need it.  We recommend signing up for the patient portal called "MyChart".  Sign up information is provided on this After Visit Summary.  MyChart is used to connect with patients for Virtual Visits (Telemedicine).  Patients are able to view lab/test results, encounter notes, upcoming appointments, etc.  Non-urgent messages can be sent to your provider as well.   To learn more about what you  can do with MyChart, go to [email or web address].    Your next appointment:   1 month(s) after your TEE  Provider:   You may see Debbe Odea, MD or one of the following Advanced Practice Providers on your designated Care Team:   Nicolasa Ducking, NP Eula Listen, PA-C Cadence Fransico Michael, PA-C Charlsie Quest, NP

## 2023-02-01 ENCOUNTER — Other Ambulatory Visit: Payer: Self-pay | Admitting: Physician Assistant

## 2023-02-01 DIAGNOSIS — I351 Nonrheumatic aortic (valve) insufficiency: Secondary | ICD-10-CM | POA: Diagnosis not present

## 2023-02-01 NOTE — Progress Notes (Signed)
Orders for TEE signed and held.

## 2023-02-02 LAB — BASIC METABOLIC PANEL
BUN/Creatinine Ratio: 21 (ref 9–23)
BUN: 14 mg/dL (ref 6–20)
CO2: 20 mmol/L (ref 20–29)
Calcium: 8.8 mg/dL (ref 8.7–10.2)
Chloride: 102 mmol/L (ref 96–106)
Creatinine, Ser: 0.67 mg/dL (ref 0.57–1.00)
Glucose: 212 mg/dL — ABNORMAL HIGH (ref 70–99)
Potassium: 4.2 mmol/L (ref 3.5–5.2)
Sodium: 139 mmol/L (ref 134–144)
eGFR: 115 mL/min/{1.73_m2} (ref >59)

## 2023-02-02 LAB — CBC
Hematocrit: 36.4 % (ref 34.0–46.6)
Hemoglobin: 12.1 g/dL (ref 11.1–15.9)
MCH: 31.3 pg (ref 26.6–33.0)
MCHC: 33.2 g/dL (ref 31.5–35.7)
MCV: 94 fL (ref 79–97)
Platelets: 267 10*3/uL (ref 150–450)
RBC: 3.86 x10E6/uL (ref 3.77–5.28)
RDW: 12.1 % (ref 11.7–15.4)
WBC: 8.6 10*3/uL (ref 3.4–10.8)

## 2023-02-09 ENCOUNTER — Telehealth: Payer: Self-pay | Admitting: *Deleted

## 2023-02-09 NOTE — Telephone Encounter (Signed)
Reviewed instructions for upcoming procedure 9/30. She verbalized understanding with no further questions at this time.

## 2023-02-10 DIAGNOSIS — J45909 Unspecified asthma, uncomplicated: Secondary | ICD-10-CM | POA: Diagnosis not present

## 2023-02-10 DIAGNOSIS — E6609 Other obesity due to excess calories: Secondary | ICD-10-CM | POA: Diagnosis not present

## 2023-02-10 DIAGNOSIS — Z3200 Encounter for pregnancy test, result unknown: Secondary | ICD-10-CM | POA: Diagnosis not present

## 2023-02-10 DIAGNOSIS — E559 Vitamin D deficiency, unspecified: Secondary | ICD-10-CM | POA: Diagnosis not present

## 2023-02-10 DIAGNOSIS — E119 Type 2 diabetes mellitus without complications: Secondary | ICD-10-CM | POA: Diagnosis not present

## 2023-02-10 DIAGNOSIS — E7800 Pure hypercholesterolemia, unspecified: Secondary | ICD-10-CM | POA: Diagnosis not present

## 2023-02-10 DIAGNOSIS — T753XXA Motion sickness, initial encounter: Secondary | ICD-10-CM | POA: Diagnosis not present

## 2023-02-10 DIAGNOSIS — G4733 Obstructive sleep apnea (adult) (pediatric): Secondary | ICD-10-CM | POA: Diagnosis not present

## 2023-02-10 DIAGNOSIS — Z6832 Body mass index (BMI) 32.0-32.9, adult: Secondary | ICD-10-CM | POA: Diagnosis not present

## 2023-02-11 MED ORDER — SODIUM CHLORIDE 0.9 % IV SOLN
INTRAVENOUS | Status: DC
  Administered 2023-02-12: 1000 mL via INTRAVENOUS

## 2023-02-12 ENCOUNTER — Ambulatory Visit (HOSPITAL_BASED_OUTPATIENT_CLINIC_OR_DEPARTMENT_OTHER)
Admission: RE | Admit: 2023-02-12 | Discharge: 2023-02-12 | Disposition: A | Discharge status: Home / Self Care | Payer: Medicaid Other | Source: Home / Self Care | Attending: Physician Assistant | Admitting: Physician Assistant

## 2023-02-12 ENCOUNTER — Encounter
Admission: RE | Disposition: A | Discharge status: Home / Self Care | Payer: Self-pay | Source: Home / Self Care | Attending: Cardiology

## 2023-02-12 ENCOUNTER — Telehealth: Payer: Self-pay | Admitting: Internal Medicine

## 2023-02-12 ENCOUNTER — Encounter: Payer: Self-pay | Admitting: Cardiology

## 2023-02-12 ENCOUNTER — Ambulatory Visit
Admission: RE | Admit: 2023-02-12 | Discharge: 2023-02-12 | Disposition: A | Discharge status: Home / Self Care | Payer: Medicaid Other | Attending: Cardiology | Admitting: Cardiology

## 2023-02-12 DIAGNOSIS — I340 Nonrheumatic mitral (valve) insufficiency: Secondary | ICD-10-CM

## 2023-02-12 DIAGNOSIS — Z9851 Tubal ligation status: Secondary | ICD-10-CM | POA: Diagnosis not present

## 2023-02-12 DIAGNOSIS — E119 Type 2 diabetes mellitus without complications: Secondary | ICD-10-CM | POA: Insufficient documentation

## 2023-02-12 DIAGNOSIS — I351 Nonrheumatic aortic (valve) insufficiency: Secondary | ICD-10-CM | POA: Diagnosis not present

## 2023-02-12 DIAGNOSIS — I493 Ventricular premature depolarization: Secondary | ICD-10-CM | POA: Insufficient documentation

## 2023-02-12 DIAGNOSIS — Z87891 Personal history of nicotine dependence: Secondary | ICD-10-CM | POA: Insufficient documentation

## 2023-02-12 DIAGNOSIS — I361 Nonrheumatic tricuspid (valve) insufficiency: Secondary | ICD-10-CM

## 2023-02-12 DIAGNOSIS — I080 Rheumatic disorders of both mitral and aortic valves: Secondary | ICD-10-CM | POA: Diagnosis not present

## 2023-02-12 DIAGNOSIS — J45909 Unspecified asthma, uncomplicated: Secondary | ICD-10-CM | POA: Insufficient documentation

## 2023-02-12 HISTORY — PX: TEE WITHOUT CARDIOVERSION: SHX5443

## 2023-02-12 HISTORY — DX: Type 2 diabetes mellitus without complications: E11.9

## 2023-02-12 HISTORY — DX: Nonrheumatic mitral (valve) insufficiency: I34.0

## 2023-02-12 LAB — ECHO TEE

## 2023-02-12 LAB — GLUCOSE, CAPILLARY: Glucose-Capillary: 217 mg/dL — ABNORMAL HIGH (ref 70–99)

## 2023-02-12 SURGERY — ECHOCARDIOGRAM, TRANSESOPHAGEAL
Anesthesia: Moderate Sedation

## 2023-02-12 MED ORDER — MIDAZOLAM HCL 2 MG/2ML IJ SOLN
INTRAMUSCULAR | Status: AC | PRN
Start: 2023-02-12 — End: 2023-02-12
  Administered 2023-02-12 (×3): 1 mg via INTRAVENOUS

## 2023-02-12 MED ORDER — LIDOCAINE VISCOUS HCL 2 % MT SOLN
OROMUCOSAL | Status: AC
  Filled 2023-02-12: qty 15

## 2023-02-12 MED ORDER — MIDAZOLAM HCL 2 MG/2ML IJ SOLN
INTRAMUSCULAR | Status: AC
  Filled 2023-02-12: qty 4

## 2023-02-12 MED ORDER — FENTANYL CITRATE (PF) 100 MCG/2ML IJ SOLN
INTRAMUSCULAR | Status: AC | PRN
  Administered 2023-02-12 (×4): 25 ug via INTRAVENOUS

## 2023-02-12 MED ORDER — LORAZEPAM 2 MG/ML IJ SOLN: INTRAMUSCULAR | Status: AC | PRN

## 2023-02-12 MED ORDER — FENTANYL CITRATE (PF) 100 MCG/2ML IJ SOLN
INTRAMUSCULAR | Status: AC
  Filled 2023-02-12: qty 2

## 2023-02-12 NOTE — Procedures (Signed)
Transesophageal Echocardiogram :  Indication: aortic regurgitation Requesting/ordering  physician:   Procedure: Benzocaine spray x2 and 2 mls x 2 of viscous lidocaine were given orally to provide local anesthesia to the oropharynx. The patient was positioned supine on the left side, bite block provided. The patient was moderately sedated with the doses of versed and fentanyl as detailed below.  Using digital technique an omniplane probe was advanced into the esophagus without incident.   Moderate sedation: 1. Sedation used:  Versed: 3mg , Fentanyl: 2. Time administered:   8:01  Time when patient started recovery: 8:24 3. I was face to face during this time. 23 mins  See report in EPIC  for complete details: In brief, imaging revealed normal LV function with no RWMAs and no mural apical thrombus.  .  Estimated ejection fraction was 60%.  Right sided cardiac chambers were normal with no evidence of pulmonary hypertension.  There was mild to moderate aortic regurgitation  Imaging of the septum showed no ASD or VSD Bubble study was negative for shunt 2D and color flow confirmed no PFO  The LA was well visualized in orthogonal views.  There was no spontaneous contrast and no thrombus in the LA and LA appendage   The descending thoracic aorta had no  mural aortic debris with no evidence of aneurysmal dilation or disection   Jamie Palmer 02/12/2023 8:25 AM

## 2023-02-12 NOTE — Telephone Encounter (Signed)
Patient is calling because she needs a CPAP machine. She has had the sleep study completed. She's been fitted for a mask and has the mask already but needs the machine. Please advise [phone]

## 2023-02-12 NOTE — Telephone Encounter (Signed)
We will need an order placed

## 2023-02-12 NOTE — Interval H&P Note (Signed)
History and Physical Interval Note:  02/12/2023 8:25 AM  Jamie Palmer  has presented today for surgery, with the diagnosis of Aortic insufficiency.  The various methods of treatment have been discussed with the patient and family. After consideration of risks, benefits and other options for treatment, the patient has consented to  Procedure(s): TRANSESOPHAGEAL ECHOCARDIOGRAM (TEE) (N/A) as a surgical intervention.  The patient's history has been reviewed, patient examined, no change in status, stable for surgery.  I have reviewed the patient's chart and labs.  Questions were answered to the patient's satisfaction.     Arlys John Agbor-Etang

## 2023-02-19 NOTE — Telephone Encounter (Signed)
The results of the sleep study were not forwarded to me for review.  I now see that there were placed on the progress note section and read by Dr. Maple Hudson.  I recommend auto CPAP at 5 to 15 cm H2O with her mask of choice and heated humidity.  She will need follow-up per insurance guidelines.

## 2023-02-19 NOTE — Telephone Encounter (Signed)
Per Dr. Jayme Cloud- we will need to see her first.  I have notified the patient.

## 2023-02-19 NOTE — Telephone Encounter (Signed)
Let me see her on the 17th and try to piece all of this together.  I see her for lung nodules and asthmatic bronchitis.

## 2023-02-19 NOTE — Telephone Encounter (Signed)
I spoke with the patient. She said her PCP ordered the sleep study and then wanted her to follow up with you to get the CPAP machine. You have not seen her for sleep apnea. Are you ok with ordering the CPAP machine or does she need to be seen before we order it? She does have an appt with you on 10/17?

## 2023-03-01 ENCOUNTER — Encounter: Payer: Self-pay | Admitting: Pulmonary Disease

## 2023-03-01 ENCOUNTER — Ambulatory Visit: Payer: Medicaid Other | Admitting: Pulmonary Disease

## 2023-03-01 VITALS — BP 124/84 | HR 78 | Temp 97.3°F | Ht 66.0 in | Wt 205.4 lb

## 2023-03-01 DIAGNOSIS — G4733 Obstructive sleep apnea (adult) (pediatric): Secondary | ICD-10-CM | POA: Diagnosis not present

## 2023-03-01 DIAGNOSIS — R918 Other nonspecific abnormal finding of lung field: Secondary | ICD-10-CM

## 2023-03-01 DIAGNOSIS — Z87891 Personal history of nicotine dependence: Secondary | ICD-10-CM | POA: Diagnosis not present

## 2023-03-01 DIAGNOSIS — J4540 Moderate persistent asthma, uncomplicated: Secondary | ICD-10-CM | POA: Diagnosis not present

## 2023-03-01 DIAGNOSIS — Z23 Encounter for immunization: Secondary | ICD-10-CM

## 2023-03-01 LAB — NITRIC OXIDE: Nitric Oxide: 14

## 2023-03-01 MED ORDER — ALBUTEROL SULFATE HFA 108 (90 BASE) MCG/ACT IN AERS: 2.0000 | INHALATION_SPRAY | Freq: Four times a day (QID) | RESPIRATORY_TRACT | 2 refills | Status: DC | PRN

## 2023-03-01 NOTE — Patient Instructions (Addendum)
VISIT SUMMARY:  During your visit, we discussed your ongoing issues with sleep apnea, asthmatic bronchitis, and tobacco use. We also talked about your general health maintenance. We have made some changes to your treatment plan to better manage your symptoms and improve your overall health.  YOUR PLAN:  -SLEEP APNEA: Sleep apnea is a condition where your breathing repeatedly stops and starts during sleep. We discussed the potential benefits of using a CPAP machine, which helps keep your airways open while you sleep, to manage your mild sleep apnea and persistent fatigue.  -ASTHMATIC BRONCHITIS: Asthmatic bronchitis is a condition where your airways become inflamed and narrowed due to asthma and bronchitis. We will continue with your current Trelegy inhaler and I have prescribed a rescue inhaler for you to use if you experience shortness of breath or chest tightness..  -VAPING: I commend you on stopping cigarettes.  You are currently vaping but you are decreasing the use of the vape.  I recommend that you continue to decrease this until you are no longer using it.  -GENERAL HEALTH MAINTENANCE: Maintaining your overall health is important. We administered the influenza vaccine today to protect you from the flu. We will also schedule a follow-up appointment in 4 months to monitor your progress.  INSTRUCTIONS:  Your [facility] rescue [facility]. Please pick it up as soon as possible. Also, we have ordered a CPAP machine for you. Once it arrives, start using it as directed to help manage your sleep apnea. Continue using your Trelegy inhaler as usual. Remember to keep working towards quitting vaping completely. We will see you again in 4 months for a follow-up appointment.

## 2023-03-01 NOTE — Progress Notes (Signed)
Subjective:    Patient ID: Jamie Palmer, female    DOB: 1985-09-02, 37 y.o.   MRN: [id number]  Patient Care Team: [facility] as PCP - General Debbe Odea, MD as PCP - Cardiology (Cardiology)  Chief Complaint  Patient presents with   Follow-up    SOB. Wheezing. Cough with yellow/clear sputum. Sleep Study done on 01/12/2023.   BACKGROUND/INTERVAL: 37 year old former smoker, 62 4PY) with a history as noted below presents for follow-up of asthmatic bronchitis, lung nodules and recent sleep study.  She was last seen on 10 August 2022.  She had a sleep study ordered by primary care on 11 January 2023.  This showed mild sleep apnea with AHI of 5.6/h and mild oxygen desaturation to 86% during the study.  Patient has not had any exacerbations in the interval.  HPI Discussed the use of AI scribe software for clinical note transcription with the patient, who gave verbal consent to proceed.  History of Present Illness   The patient, with a history of moderate persistent asthmatic bronchitis and lung nodules, presents for follow-up. She reports persistent shortness of breath, which is exacerbated by exertion and occasionally occurs at rest, causing a sensation of heaviness in the chest. The patient also has a history of angina, which may contribute to these symptoms.  The patient has been diagnosed with very mild sleep apnea, with symptoms including snoring, cessation of breathing during sleep, and daytime fatigue. She also reports frequent nocturnal awakenings due to a dry mouth.  Sleep study showed mild sleep apnea with AHI of 5.6/h however, patient may also have significant issues with upper airway resistance syndrome.  Because of her high symptom burden she is appropriate for a trial of auto CPAP.  The patient has been using a Trelegy inhaler, which she believes has improved her symptoms of breathlessness. However, she has lost her emergency inhaler during a recent move and  currently does not have one.  The patient has a history of smoking but has quit and is currently using a vape as a step-down measure. She reports a productive cough, but no other respiratory symptoms.  She is currently working in facilities maintenance.     Review of Systems A 10 point review of systems was performed and it is as noted above otherwise negative.   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   Dizziness 06/10/2015   Bradycardia 02/23/2015   Diabetes mellitus during pregnancy in third trimester 11/26/2014   Gestational diabetes 03/29/2012   SVD (spontaneous vaginal delivery) 03/29/2012    Social History   Tobacco Use   Smoking status: Former    Current packs/day: 1.00    Average packs/day: 1 pack/day for 24.0 years (24.0 ttl pk-yrs)    Types: Cigarettes   Smokeless tobacco: Never   Tobacco comments:    Quit smoking in 11/2022  Substance Use Topics   Alcohol use: Yes    Comment: rarely    No Known Allergies  Current Meds  Medication Sig   albuterol (VENTOLIN HFA) 108 (90 Base) MCG/ACT inhaler Inhale 2 puffs into the lungs every 6 (six) hours as needed for wheezing or shortness of breath.   atorvastatin (LIPITOR) 40 MG tablet Take 40 mg by mouth daily.   cyclobenzaprine (FLEXERIL) 10 MG tablet Take 1 tablet by mouth 3 (three) times daily as needed.   gabapentin (NEURONTIN) 100 MG capsule Take 100 mg  by mouth as needed.   MOUNJARO 5 MG/0.5ML Pen Inject 5 mg into the skin once a week.   Vitamin D, Ergocalciferol, (DRISDOL) 1.25 MG (50000 UNIT) CAPS capsule Take 50,000 Units by mouth once a week.   [DISCONTINUED] MOUNJARO 2.5 MG/0.5ML Pen Inject 0.5 mg into the skin once a week.    Immunization History  Administered Date(s) Administered   DTaP 01/03/2012   Influenza Whole 02/22/2012   Influenza,inj,Quad PF,6+ Mos 04/04/2019        Objective:     BP 124/84 (BP  Location: Right Arm, Cuff Size: Normal)   Pulse 78   Temp (!) 97.3 F (36.3 C)   Ht 5\' 6"  (1.676 m)   Wt 205 lb 6.4 oz (93.2 kg)   LMP 01/09/2023 Comment: tubes tied patient states not pregnant  SpO2 99%   BMI 33.15 kg/m   SpO2: 99 % O2 Device: None (Room air)  GENERAL: Well-developed, overweight woman, no acute distress.  Fully ambulatory, no conversational dyspnea. HEAD: Normocephalic, atraumatic.  EYES: Pupils equal, round, reactive to light.  No scleral icterus.  MOUTH: Some teeth in poor repair, oral mucosa moist.  No thrush. NECK: Supple. No thyromegaly. Trachea midline. No JVD.  No adenopathy. PULMONARY: Good air entry bilaterally.  There are scattered end expiratory wheezes, otherwise, no adventitious sounds. CARDIOVASCULAR: S1 and S2. Regular rate and rhythm.  No rubs, murmurs or gallops heard. ABDOMEN: No overt focal deficit, no gait disturbance, speech is fluent. MUSCULOSKELETAL: No joint deformity, no clubbing, no edema.  NEUROLOGIC: No overt focal deficit, no gait disturbance, speech is fluent. SKIN: Intact,warm,dry. PSYCH: Mood and behavior normal.  Lab Results  Component Value Date   NITRICOXIDE 14 03/01/2023   Assessment & Plan:     ICD-10-CM   1. Moderate persistent asthmatic bronchitis without complication  J45.40 Nitric oxide    2. Lung nodules  R91.8     3. OSA (obstructive sleep apnea)  G47.33 AMB REFERRAL FOR DME    4. Former smoker  Z87.891     5. Need for influenza vaccination  Z23 Flu vaccine trivalent PF, 6mos and older(Flulaval,Afluria,Fluarix,Fluzone)      Orders Placed This Encounter  Procedures   Flu vaccine trivalent PF, 6mos and older(Flulaval,Afluria,Fluarix,Fluzone)   AMB REFERRAL FOR DME    Referral Priority:   Routine    Referral Type:   Durable Medical Equipment Purchase    Number of Visits Requested:   1   Nitric oxide   Meds ordered this encounter  Medications   albuterol (VENTOLIN HFA) 108 (90 Base) MCG/ACT inhaler     Sig: Inhale 2 puffs into the lungs every 6 (six) hours as needed for wheezing or shortness of breath.    Dispense:  8 g    Refill:  2   Assessment and Plan    Sleep Apnea Mild sleep apnea diagnosed on recent sleep study. Patient reports persistent fatigue and snoring. Discussed potential benefit of CPAP therapy despite mild sleep apnea, possibly due to upper airway resistance syndrome.  She has high symptom burden -Order CPAP machine. -Auto CPAP at 5 to 15 cm H2O, heated humidity, mask of choice.  Asthmatic Bronchitis Patient reports occasional shortness of breath and feeling "heavy chested". No current rescue inhaler. Patient reports improvement with Trelegy inhaler. -Prescribe rescue inhaler [facility]. -Continue Trelegy inhaler.  Vape Use Patient reports transition from smoking to vaping as a step towards cessation. -Encourage continued efforts towards complete cessation of nicotine containing products.  General Health Maintenance -Administer influenza vaccine today. -Schedule follow-up appointment in 4 months.    Gailen Shelter, MD Advanced Bronchoscopy PCCM [facility]    *This note was dictated using voice recognition software/Dragon.  Despite best efforts to proofread, errors can occur which can change the meaning. Any transcriptional errors that result from this process are unintentional and may not be fully corrected at the time of dictation.

## 2023-03-12 NOTE — Progress Notes (Unsigned)
Cardiology Office Note:    Date:  03/13/2023  ID:  Jamie Palmer, DOB [birth year], MRN [id number] PCP: [facility]  [facility] Providers Cardiologist:  Debbe Odea, MD       Patient Profile:      Mild-moderate aortic valve regurgitation Type 2 diabetes Asthma Prior tobacco use Asthma OSA      History of Present Illness:   Jamie Palmer is a 37 y.o. female who returns for follow-up for aortic valve insufficiency.  She was originally evaluated by cardiology in 2016 for dizziness.  Echo in 2016 with EF 60 to 65%, no RWMA, trivial MR/TR.  24-hour Holter monitor placed showing predominant rhythm of sinus with average rate of 79, 0 pauses greater than 2.5 seconds, 17 isolated PVCs, 59 isolated PACs. She reestablished with cardiology in 11/2022, at the time she was without angina with mild shortness of breath, she continued to smoke.  Echocardiogram was performed given murmur noted on exam showing EF 60 to 65%, no RWMA, normal LV diastolic function, normal RV systolic function, mild mitral regurgitation, aortic valve was not well-visualized showing estimated moderate to severe aortic insufficiency with indeterminate number of aortic valve cusp, with recommendation for TEE for better visualization of aortic valve.  Last seen in office on 01/31/2023 she denied angina and asymptomatic given possible severe aortic valve insufficiency.  Of note she had stopped smoking but picked up vaping.  TEE was ordered to further evaluate aortic valve anatomy and insufficiency.  TEE was performed on 02/12/2023 showing LVEF 60 to 65%, RV systolic function normal, RV size normal, mild mitral valve regurgitation, aortic valve is tricuspid with mild to moderate regurgitation.  Today: Patient is doing well.  We went over her results from her recent TEE showing mild to moderate aortic regurgitation.  Patient denies dizziness, shortness of breath, lightheadedness, syncope.  Patient does note  that she has been having what she calls "pinches" on the left side of her chest that that started years ago but has worsened as of recent.  She notices these "pinches" when she is angry, sad, stressed.  She describes this as some chest tightness.  Typically resolves when she sits down and relaxes always lasting less than 10 minutes.  She notes this happens 1-2 times daily.  She has no radiation of this pain.  She denies that it feels like palpitations and or irregular heartbeats.  She has establish care with PCP at [facility].  She denies chest pain, shortness of breath, lower extremity edema, fatigue, palpitations, melena, hematuria, hemoptysis, diaphoresis, weakness, presyncope, syncope, orthopnea, and PND.          Review of Systems  Constitutional: Negative for weight gain and weight loss.  Cardiovascular:  Positive for chest pain. Negative for claudication, cyanosis, dyspnea on exertion, irregular heartbeat, leg swelling, near-syncope, orthopnea, palpitations, paroxysmal nocturnal dyspnea and syncope.  Respiratory:  Negative for cough, hemoptysis and shortness of breath.   Gastrointestinal:  Negative for abdominal pain, hematochezia and melena.  Genitourinary:  Negative for hematuria.  Neurological:  Negative for dizziness and loss of balance.     See HPI     Studies Reviewed:   EKG Interpretation Date/Time:  Tuesday March 13 2023 16:15:32 EDT Ventricular Rate:  93 PR Interval:  172 QRS Duration:  88 QT Interval:  360 QTC Calculation: 447 R Axis:   35  Text Interpretation: Normal sinus rhythm No significant change was found Confirmed by Rise Paganini [phone]) on 03/13/2023 4:48:09 PM  TEE 02/12/2023 1. Left ventricular ejection fraction, by estimation, is 60 to 65%. The  left ventricle has normal function.   2. Right ventricular systolic function is normal. The right ventricular  size is normal.   3. No left atrial/left atrial appendage thrombus was detected.   4.  The mitral valve is normal in structure. Mild mitral valve  regurgitation.   5. The aortic valve is tricuspid. Aortic valve regurgitation is mild to  moderate.   Echocardiogram 12/14/2022 1. Left ventricular ejection fraction, by estimation, is 60 to 65%. The  left ventricle has normal function. The left ventricle has no regional  wall motion abnormalities. Left ventricular diastolic parameters were  normal. The average left ventricular  global longitudinal strain is -25.1 %.   2. Right ventricular systolic function is normal. The right ventricular  size is normal.   3. The mitral valve is normal in structure. Mild mitral valve  regurgitation. No evidence of mitral stenosis.   4. The aortic valve has an indeterminant number of cusps. Aortic valve  regurgitation is not well visualized, estimated at least moderate to  severe. Consider TEE for better visualization. No aortic stenosis is  present.   5. The inferior vena cava is normal in size with greater than 50%  respiratory variability, suggesting right atrial pressure of 3 mmHg.   Risk Assessment/Calculations:           Physical Exam:   VS:  BP (!) 129/92 (BP Location: Left Arm, Patient Position: Sitting, Cuff Size: Normal)   Pulse 93   Ht 5' 6.5" (1.689 m)   Wt 196 lb (88.9 kg)   LMP 01/09/2023 Comment: tubes tied patient states not pregnant  SpO2 98%   BMI 31.16 kg/m    Wt Readings from Last 3 Encounters:  03/13/23 196 lb (88.9 kg)  03/01/23 205 lb 6.4 oz (93.2 kg)  02/12/23 203 lb (92.1 kg)    Constitutional:      Appearance: Normal and healthy appearance.  Neck:     Vascular: JVD normal.  Pulmonary:     Effort: Pulmonary effort is normal.     Breath sounds: Normal breath sounds.  Chest:     Chest wall: Not tender to palpatation.  Cardiovascular:     PMI at left midclavicular line. Normal rate. Regular rhythm. Normal S1. Normal S2.      Murmurs: There is a systolic murmur at the URSB.     No gallop.  No click. No  rub.  Pulses:    Intact distal pulses.  Edema:    Peripheral edema absent.  Musculoskeletal: Normal range of motion.     Cervical back: Normal range of motion and neck supple. Skin:    General: Skin is warm and dry.  Neurological:     General: No focal deficit present.     Mental Status: Alert and oriented to person, place and time.  Psychiatric:        Behavior: Behavior is cooperative.        Assessment and Plan:  Mild-moderate aortic valve regurgitation -TEE 02/12/2023 showing aortic valve is tricuspid, aortic valve regurgitation is mild to moderate -No shortness of breath, syncope -Plan for blood pressure control -Will repeat echocardiogram 1 year  Chest tightness -EKG showing NSR, no ST-T wave changes -Described as "pinches" and chest tightness during times of exertion  -Has been going on for years, occurring 1-2 times daily -Note notes these during exercise and high anxiety/stressful situations -No palpitations, irregular heartbeats -Plan for  plain old exercise stress test (POET)  Hyperlipidemia -LDL 141 on 11/19/2022 -Atorvastatin 40 mg was started on 11/19/22 -Being treated by [facility] for this -She will have repeat lipid panel with her PCP  Prior tobacco use -Has quit smoking cigarettes, now vaping -Encouraged decreasing amount until no longer using  OSA -Followed by pulmonology -Based on her high symptom burden she is appropriate for a CPAP per pulm -Control over OSA could help with her chest tightness                  Dispo:  Return in about 6 months (around 09/11/2023).  Signed, Denyce Robert, AGNP-C

## 2023-03-13 ENCOUNTER — Encounter: Payer: Self-pay | Admitting: Physician Assistant

## 2023-03-13 ENCOUNTER — Ambulatory Visit: Payer: Medicaid Other | Attending: Physician Assistant | Admitting: Emergency Medicine

## 2023-03-13 VITALS — BP 129/92 | HR 93 | Ht 66.5 in | Wt 196.0 lb

## 2023-03-13 DIAGNOSIS — I351 Nonrheumatic aortic (valve) insufficiency: Secondary | ICD-10-CM

## 2023-03-13 DIAGNOSIS — R0789 Other chest pain: Secondary | ICD-10-CM | POA: Diagnosis not present

## 2023-03-13 DIAGNOSIS — E782 Mixed hyperlipidemia: Secondary | ICD-10-CM

## 2023-03-13 DIAGNOSIS — Z87891 Personal history of nicotine dependence: Secondary | ICD-10-CM | POA: Diagnosis not present

## 2023-03-13 DIAGNOSIS — G4733 Obstructive sleep apnea (adult) (pediatric): Secondary | ICD-10-CM | POA: Diagnosis not present

## 2023-03-13 DIAGNOSIS — I061 Rheumatic aortic insufficiency: Secondary | ICD-10-CM

## 2023-03-13 NOTE — Patient Instructions (Signed)
Medication Instructions:  Your Physician recommend you continue on your current medication as directed.    *If you need a refill on your cardiac medications before your next appointment, please call your pharmacy*   Lab Work: None ordered If you have labs (blood work) drawn today and your tests are completely normal, you will receive your results only by: MyChart Message (if you have MyChart) OR A paper copy in the mail If you have any lab test that is abnormal or we need to change your treatment, we will call you to review the results.   Testing/Procedures: Your provider has ordered a exercise tolerance test. This test will evaluate the blood supply to your heart muscle during periods of exercise and rest. For this test, you will raise your heart rate by walking on a treadmill at different levels.   you may eat a light breakfast/ lunch prior to your procedure no caffeine for 24 hours prior to your test (coffee, tea, soft drinks, or chocolate)  no smoking/ vaping for 4 hours prior to your test you may take your regular medications the day of your test bring any inhalers with you to your test wear comfortable clothing & tennis/ non-skid shoes to walk on the treadmill  This will take place at [address], [place] [address]   Follow-Up: At [facility], you and your health needs are our priority.  As part of our continuing mission to provide you with exceptional heart care, we have created designated Provider Care Teams.  These Care Teams include your primary Cardiologist (physician) and Advanced Practice Providers (APPs -  Physician Assistants and Nurse Practitioners) who all work together to provide you with the care you need, when you need it.  We recommend signing up for the patient portal called "MyChart".  Sign up information is provided on this After Visit Summary.  MyChart is used to connect with patients for Virtual Visits  (Telemedicine).  Patients are able to view lab/test results, encounter notes, upcoming appointments, etc.  Non-urgent messages can be sent to your provider as well.   To learn more about what you can do with MyChart, go to [email or web address].    Your next appointment:   6 month(s)  Provider:   You may see Debbe Odea, MD or one of the following Advanced Practice Providers on your designated Care Team:   Eula Listen, [place]

## 2023-03-14 DIAGNOSIS — Z3200 Encounter for pregnancy test, result unknown: Secondary | ICD-10-CM | POA: Diagnosis not present

## 2023-03-14 DIAGNOSIS — E119 Type 2 diabetes mellitus without complications: Secondary | ICD-10-CM | POA: Diagnosis not present

## 2023-03-14 DIAGNOSIS — E6609 Other obesity due to excess calories: Secondary | ICD-10-CM | POA: Diagnosis not present

## 2023-03-14 DIAGNOSIS — J45909 Unspecified asthma, uncomplicated: Secondary | ICD-10-CM | POA: Diagnosis not present

## 2023-03-14 DIAGNOSIS — Z6831 Body mass index (BMI) 31.0-31.9, adult: Secondary | ICD-10-CM | POA: Diagnosis not present

## 2023-03-14 DIAGNOSIS — T753XXS Motion sickness, sequela: Secondary | ICD-10-CM | POA: Diagnosis not present

## 2023-03-14 DIAGNOSIS — E7800 Pure hypercholesterolemia, unspecified: Secondary | ICD-10-CM | POA: Diagnosis not present

## 2023-03-14 DIAGNOSIS — E559 Vitamin D deficiency, unspecified: Secondary | ICD-10-CM | POA: Diagnosis not present

## 2023-03-23 ENCOUNTER — Ambulatory Visit: Payer: Medicaid Other

## 2023-04-10 ENCOUNTER — Telehealth: Payer: Self-pay | Admitting: Pulmonary Disease

## 2023-04-10 DIAGNOSIS — Z3200 Encounter for pregnancy test, result unknown: Secondary | ICD-10-CM | POA: Diagnosis not present

## 2023-04-10 DIAGNOSIS — J45909 Unspecified asthma, uncomplicated: Secondary | ICD-10-CM | POA: Diagnosis not present

## 2023-04-10 DIAGNOSIS — E6609 Other obesity due to excess calories: Secondary | ICD-10-CM | POA: Diagnosis not present

## 2023-04-10 DIAGNOSIS — E7800 Pure hypercholesterolemia, unspecified: Secondary | ICD-10-CM | POA: Diagnosis not present

## 2023-04-10 DIAGNOSIS — Z6831 Body mass index (BMI) 31.0-31.9, adult: Secondary | ICD-10-CM | POA: Diagnosis not present

## 2023-04-10 DIAGNOSIS — E559 Vitamin D deficiency, unspecified: Secondary | ICD-10-CM | POA: Diagnosis not present

## 2023-04-10 DIAGNOSIS — E119 Type 2 diabetes mellitus without complications: Secondary | ICD-10-CM | POA: Diagnosis not present

## 2023-04-10 NOTE — Telephone Encounter (Signed)
I have sent urgent message to [facility] about this issue

## 2023-04-10 NOTE — Telephone Encounter (Signed)
[facility] stating they have not rcvd pt order  Fax: [phone]

## 2023-04-10 NOTE — Telephone Encounter (Signed)
I received a message from [place] with [facility], Elige Radon  Order was recieved and sent to intake team for processing. However, I do not show this order in review. I re-pulled and sent to intake STAT for processing.

## 2023-04-18 ENCOUNTER — Ambulatory Visit
Admission: RE | Admit: 2023-04-18 | Discharge: 2023-04-18 | Disposition: A | Discharge status: Home / Self Care | Payer: Medicaid Other | Source: Ambulatory Visit | Attending: Emergency Medicine | Admitting: Emergency Medicine

## 2023-04-18 DIAGNOSIS — R0789 Other chest pain: Secondary | ICD-10-CM | POA: Diagnosis not present

## 2023-04-20 LAB — EXERCISE TOLERANCE TEST
Angina Index: 0
Duke Treadmill Score: 9
Estimated workload: 10.1
Exercise duration (min): 8 min
Exercise duration (sec): 59 s
MPHR: 183 {beats}/min
Peak HR: 166 {beats}/min
Percent HR: 90 %
Rest HR: 83 {beats}/min
ST Depression (mm): 0 mm

## 2023-04-22 ENCOUNTER — Other Ambulatory Visit: Payer: Self-pay

## 2023-04-22 ENCOUNTER — Emergency Department
Admission: EM | Admit: 2023-04-22 | Discharge: 2023-04-22 | Disposition: A | Discharge status: Home / Self Care | Payer: Medicaid Other | Attending: Emergency Medicine | Admitting: Emergency Medicine

## 2023-04-22 DIAGNOSIS — Z20822 Contact with and (suspected) exposure to covid-19: Secondary | ICD-10-CM | POA: Insufficient documentation

## 2023-04-22 DIAGNOSIS — B9789 Other viral agents as the cause of diseases classified elsewhere: Secondary | ICD-10-CM | POA: Insufficient documentation

## 2023-04-22 DIAGNOSIS — Z720 Tobacco use: Secondary | ICD-10-CM | POA: Insufficient documentation

## 2023-04-22 DIAGNOSIS — J069 Acute upper respiratory infection, unspecified: Secondary | ICD-10-CM | POA: Insufficient documentation

## 2023-04-22 DIAGNOSIS — J209 Acute bronchitis, unspecified: Secondary | ICD-10-CM | POA: Insufficient documentation

## 2023-04-22 DIAGNOSIS — R059 Cough, unspecified: Secondary | ICD-10-CM | POA: Diagnosis present

## 2023-04-22 LAB — RESP PANEL BY RT-PCR (RSV, FLU A&B, COVID)  RVPGX2
Influenza A by PCR: NEGATIVE
Influenza B by PCR: NEGATIVE
Resp Syncytial Virus by PCR: NEGATIVE
SARS Coronavirus 2 by RT PCR: NEGATIVE

## 2023-04-22 MED ORDER — PREDNISONE 50 MG PO TABS: 50.0000 mg | ORAL_TABLET | Freq: Every day | ORAL | 0 refills | Status: AC

## 2023-04-22 MED ORDER — AZITHROMYCIN 250 MG PO TABS: ORAL_TABLET | ORAL | 0 refills | Status: AC

## 2023-04-22 NOTE — Discharge Instructions (Addendum)
You are seen in the emergency department for cough and congestion.  You had COVID and influenza testing done.  You most likely have a viral illness.  Stay hydrated and drink plenty of fluids.  You can use over-the-counter Mucinex for your congestion.  You can use hot tea and honey for your cough.  You are given a prescription for steroids and for an antibiotic to cover for an atypical pneumonia.  Call and follow-up closely with your primary care physician and return to the emergency department if you fever or worsening shortness of breath.  Prednisone -you are given a prescription for a steroid.  It is important that you take this medication with food.  This medication can cause an upset stomach.  It also can increase your glucose if you have a history of diabetes, so it is important that you check your glucose frequently while you are on this medication.  Thank you for choosing [place] for your health care, it was my pleasure to care for you today!  Corena Herter, MD

## 2023-04-22 NOTE — ED Provider Notes (Signed)
[facility] Provider Note    Event Date/Time   First MD Initiated Contact with Patient 04/22/23 1238     (approximate)   History   Cough   HPI  Jamie Palmer is a 37 y.o. female past medical history significant for tobacco use, who presents to the emergency department with cough and congestion.  Endorses 3 to 4-day history of cough and congestion.  Denies any significant chest pain or shortness of breath.  Denies fever or chills.  No nausea, vomiting or diaphoresis.  Followed by pulmonology.  No longer smoking cigarettes but vaping nicotine.  No history of DVT or PE.     Physical Exam   Triage Vital Signs: ED Triage Vitals  Encounter Vitals Group     BP 04/22/23 1250 125/64     Systolic BP Percentile --      Diastolic BP Percentile --      Pulse Rate 04/22/23 1250 87     Resp 04/22/23 1250 18     Temp 04/22/23 1250 98.8 F (37.1 C)     Temp Source 04/22/23 1250 Oral     SpO2 04/22/23 1250 99 %     Weight 04/22/23 1215 194 lb 0.1 oz (88 kg)     Height 04/22/23 1215 5\' 6"  (1.676 m)     Head Circumference --      Peak Flow --      Pain Score 04/22/23 1215 0     Pain Loc --      Pain Education --      Exclude from Growth Chart --     Most recent vital signs: Vitals:   04/22/23 1250  BP: 125/64  Pulse: 87  Resp: 18  Temp: 98.8 F (37.1 C)  SpO2: 99%    Physical Exam Constitutional:      Appearance: She is well-developed.  HENT:     Head: Atraumatic.  Eyes:     Conjunctiva/sclera: Conjunctivae normal.  Pulmonary:     Effort: Pulmonary effort is normal. No respiratory distress.  Abdominal:     General: There is no distension.     Tenderness: There is no abdominal tenderness.  Musculoskeletal:        General: Normal range of motion.     Cervical back: Normal range of motion.     Right lower leg: No edema.     Left lower leg: No edema.  Skin:    General: Skin is warm.  Neurological:     Mental Status: She is alert. Mental  status is at baseline.      IMPRESSION / MDM / ASSESSMENT AND PLAN / ED COURSE  I reviewed the triage vital signs and the nursing notes.  Differential diagnosis including COVID/influenza, pneumonia, acute bronchitis   Labs (all labs ordered are listed, but only abnormal results are displayed) Labs interpreted as -    Labs Reviewed  RESP PANEL BY RT-PCR (RSV, FLU A&B, COVID)  [id number]      COVID influenza testing obtained.  Concern for possible atypical pneumonia versus acute bronchitis.  Will do a course of steroids given her history of tobacco use and is followed by pulmonology.  Patient has an albuterol inhaler at home and knows how to use her inhaler.  Given a prescription for steroids and azithromycin.  Given return precautions for any ongoing or worsening symptoms.  Discussed follow-up with her primary care physician.   PROCEDURES:  Critical Care performed: No  Procedures  Patient's presentation  is most consistent with acute complicated illness / injury requiring diagnostic workup.   MEDICATIONS ORDERED IN ED: Medications - No data to display  FINAL CLINICAL IMPRESSION(S) / ED DIAGNOSES   Final diagnoses:  Viral URI with cough  Acute bronchitis, unspecified organism     Rx / DC Orders   ED Discharge Orders          Ordered    predniSONE (DELTASONE) 50 MG tablet  Daily with breakfast        04/22/23 1308    azithromycin (ZITHROMAX Z-PAK) 250 MG tablet        04/22/23 1308             Note:  This document was prepared using Dragon voice recognition software and may include unintentional dictation errors.   Corena Herter, MD 04/22/23 1329

## 2023-04-22 NOTE — ED Triage Notes (Signed)
Pt reports congested cough for the last several days. Pt reports phlem was clear but is now thick and green and she is concerned she has an infection. Pt reports sees a pulmonologist so wants to catch it ahead of time. Pt denies fevers, chills, pain, nasal congestion, recent exposures or other sx's.

## 2023-04-22 NOTE — ED Notes (Signed)
Pt verbalizes understanding of discharge instructions. Opportunity for questioning and answers were provided. Pt discharged from ED to home with daughter.    

## 2023-05-01 DIAGNOSIS — G4733 Obstructive sleep apnea (adult) (pediatric): Secondary | ICD-10-CM | POA: Diagnosis not present

## 2023-05-10 DIAGNOSIS — E559 Vitamin D deficiency, unspecified: Secondary | ICD-10-CM | POA: Diagnosis not present

## 2023-05-10 DIAGNOSIS — Z3200 Encounter for pregnancy test, result unknown: Secondary | ICD-10-CM | POA: Diagnosis not present

## 2023-05-10 DIAGNOSIS — J45909 Unspecified asthma, uncomplicated: Secondary | ICD-10-CM | POA: Diagnosis not present

## 2023-05-10 DIAGNOSIS — Z6831 Body mass index (BMI) 31.0-31.9, adult: Secondary | ICD-10-CM | POA: Diagnosis not present

## 2023-05-10 DIAGNOSIS — E119 Type 2 diabetes mellitus without complications: Secondary | ICD-10-CM | POA: Diagnosis not present

## 2023-05-10 DIAGNOSIS — E6609 Other obesity due to excess calories: Secondary | ICD-10-CM | POA: Diagnosis not present

## 2023-05-10 DIAGNOSIS — M792 Neuralgia and neuritis, unspecified: Secondary | ICD-10-CM | POA: Diagnosis not present

## 2023-05-10 DIAGNOSIS — F909 Attention-deficit hyperactivity disorder, unspecified type: Secondary | ICD-10-CM | POA: Diagnosis not present

## 2023-05-10 DIAGNOSIS — E7800 Pure hypercholesterolemia, unspecified: Secondary | ICD-10-CM | POA: Diagnosis not present

## 2023-05-16 DIAGNOSIS — G4733 Obstructive sleep apnea (adult) (pediatric): Secondary | ICD-10-CM | POA: Diagnosis not present

## 2023-06-07 DIAGNOSIS — M792 Neuralgia and neuritis, unspecified: Secondary | ICD-10-CM | POA: Diagnosis not present

## 2023-06-07 DIAGNOSIS — E7800 Pure hypercholesterolemia, unspecified: Secondary | ICD-10-CM | POA: Diagnosis not present

## 2023-06-07 DIAGNOSIS — E559 Vitamin D deficiency, unspecified: Secondary | ICD-10-CM | POA: Diagnosis not present

## 2023-06-07 DIAGNOSIS — E119 Type 2 diabetes mellitus without complications: Secondary | ICD-10-CM | POA: Diagnosis not present

## 2023-06-07 DIAGNOSIS — J45909 Unspecified asthma, uncomplicated: Secondary | ICD-10-CM | POA: Diagnosis not present

## 2023-06-07 DIAGNOSIS — Z3200 Encounter for pregnancy test, result unknown: Secondary | ICD-10-CM | POA: Diagnosis not present

## 2023-06-07 DIAGNOSIS — E6609 Other obesity due to excess calories: Secondary | ICD-10-CM | POA: Diagnosis not present

## 2023-06-07 DIAGNOSIS — Z6829 Body mass index (BMI) 29.0-29.9, adult: Secondary | ICD-10-CM | POA: Diagnosis not present

## 2023-06-07 DIAGNOSIS — F909 Attention-deficit hyperactivity disorder, unspecified type: Secondary | ICD-10-CM | POA: Diagnosis not present

## 2023-06-07 DIAGNOSIS — Z6831 Body mass index (BMI) 31.0-31.9, adult: Secondary | ICD-10-CM | POA: Diagnosis not present

## 2023-06-16 DIAGNOSIS — G4733 Obstructive sleep apnea (adult) (pediatric): Secondary | ICD-10-CM | POA: Diagnosis not present

## 2023-07-05 ENCOUNTER — Ambulatory Visit (INDEPENDENT_AMBULATORY_CARE_PROVIDER_SITE_OTHER): Payer: Medicaid Other | Admitting: Pulmonary Disease

## 2023-07-05 ENCOUNTER — Encounter: Payer: Self-pay | Admitting: Pulmonary Disease

## 2023-07-05 VITALS — BP 110/64 | HR 89 | Temp 97.7°F | Ht 66.0 in | Wt 179.6 lb

## 2023-07-05 DIAGNOSIS — Z720 Tobacco use: Secondary | ICD-10-CM

## 2023-07-05 DIAGNOSIS — E119 Type 2 diabetes mellitus without complications: Secondary | ICD-10-CM | POA: Diagnosis not present

## 2023-07-05 DIAGNOSIS — G4733 Obstructive sleep apnea (adult) (pediatric): Secondary | ICD-10-CM

## 2023-07-05 DIAGNOSIS — R918 Other nonspecific abnormal finding of lung field: Secondary | ICD-10-CM

## 2023-07-05 DIAGNOSIS — J4540 Moderate persistent asthma, uncomplicated: Secondary | ICD-10-CM | POA: Diagnosis not present

## 2023-07-05 DIAGNOSIS — E7800 Pure hypercholesterolemia, unspecified: Secondary | ICD-10-CM | POA: Diagnosis not present

## 2023-07-05 DIAGNOSIS — F17290 Nicotine dependence, other tobacco product, uncomplicated: Secondary | ICD-10-CM | POA: Diagnosis not present

## 2023-07-05 DIAGNOSIS — F909 Attention-deficit hyperactivity disorder, unspecified type: Secondary | ICD-10-CM | POA: Diagnosis not present

## 2023-07-05 DIAGNOSIS — Z6828 Body mass index (BMI) 28.0-28.9, adult: Secondary | ICD-10-CM | POA: Diagnosis not present

## 2023-07-05 NOTE — Patient Instructions (Signed)
VISIT SUMMARY:  During today's visit, we discussed your persistent cough and your history of obstructive sleep apnea. You have experienced significant weight loss, which has improved your sleep apnea symptoms. We also reviewed your family history of lung cancer and your ongoing monitoring with CT scans.  YOUR PLAN:  -OBSTRUCTIVE SLEEP APNEA: Obstructive sleep apnea is a condition where the airway becomes blocked during sleep, causing breathing pauses. Your significant weight loss has reduced your symptoms, so we have decided to discontinue your CPAP therapy. Weight loss helps by reducing the resistance in your upper airway, which was causing the symptoms. We will follow up in four months to reassess your condition.  -PERSISTENT COUGH: Your persistent cough is likely related to vaping. You have reported some improvement in your symptoms and are feeling better overall with your recent weight loss. Please continue using Trelegy as needed to manage your respiratory condition.  -GENERAL HEALTH MAINTENANCE: Given your family history of lung cancer, we discussed the importance of ongoing monitoring. Although you are not eligible for standard lung cancer screening due to your age, your previous CT scan showed a small nodule, and you have another CT scan scheduled for March 4th. These detailed scans are sufficient for monitoring your condition, and we will continue with this approach.  INSTRUCTIONS:  Please schedule a follow-up appointment in four months. Continue with your scheduled CT chest scan on March 4th.

## 2023-07-05 NOTE — Progress Notes (Unsigned)
   Subjective:    Patient ID: Jamie Palmer, female    DOB: 1986-05-05, 38 y.o.   MRN: [id number]  Patient Care Team: [facility] as PCP - General Debbe Odea, MD as PCP - Cardiology (Cardiology)  Chief Complaint  Patient presents with   Follow-up    BACKGROUND/INTERVAL:  HPI    Review of Systems A 10 point review of systems was performed and it is as noted above otherwise negative.   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   Dizziness 06/10/2015   Bradycardia 02/23/2015   Diabetes mellitus during pregnancy in third trimester 11/26/2014   Gestational diabetes 03/29/2012   SVD (spontaneous vaginal delivery) 03/29/2012    Social History   Tobacco Use   Smoking status: Former    Current packs/day: 1.00    Average packs/day: 1 pack/day for 24.0 years (24.0 ttl pk-yrs)    Types: Cigarettes   Smokeless tobacco: Never   Tobacco comments:    Quit smoking in 11/2022  Substance Use Topics   Alcohol use: Yes    Comment: rarely    No Known Allergies  No outpatient medications have been marked as taking for the 07/05/23 encounter (Office Visit) with Salena Saner, MD.    Immunization History  Administered Date(s) Administered   DTaP 01/03/2012   Influenza Whole 02/22/2012   Influenza, Seasonal, Injecte, Preservative Fre 03/01/2023   Influenza,inj,Quad PF,6+ Mos 04/04/2019        Objective:     Ht 5\' 6"  (1.676 m)   Wt 179 lb 9.6 oz (81.5 kg)   BMI 28.99 kg/m      GENERAL: HEAD: Normocephalic, atraumatic.  EYES: Pupils equal, round, reactive to light.  No scleral icterus.  MOUTH:  NECK: Supple. No thyromegaly. Trachea midline. No JVD.  No adenopathy. PULMONARY: Good air entry bilaterally.  No adventitious sounds. CARDIOVASCULAR: S1 and S2. Regular rate and rhythm.  ABDOMEN: MUSCULOSKELETAL: No joint deformity, no clubbing, no edema.   NEUROLOGIC:  SKIN: Intact,warm,dry. PSYCH:        Assessment & Plan:   No diagnosis found.  No orders of the defined types were placed in this encounter.   No orders of the defined types were placed in this encounter.      Advised if symptoms do not improve or worsen, to please contact office for sooner follow up or seek emergency care.    I spent xxx minutes of dedicated to the care of this patient on the date of this encounter to include pre-visit review of records, face-to-face time with the patient discussing conditions above, post visit ordering of testing, clinical documentation with the electronic health record, making appropriate referrals as documented, and communicating necessary findings to members of the patients care team.     C. Danice Goltz, MD Advanced Bronchoscopy P[facility]    *This note was generated using voice recognition software/Dragon and/or AI transcription program.  Despite best efforts to proofread, errors can occur which can change the meaning. Any transcriptional errors that result from this process are unintentional and may not be fully corrected at the time of dictation.

## 2023-07-14 DIAGNOSIS — G4733 Obstructive sleep apnea (adult) (pediatric): Secondary | ICD-10-CM | POA: Diagnosis not present

## 2023-07-17 ENCOUNTER — Ambulatory Visit
Admission: RE | Admit: 2023-07-17 | Discharge: 2023-07-17 | Disposition: A | Discharge status: Home / Self Care | Payer: Medicaid Other | Source: Ambulatory Visit | Attending: *Deleted | Admitting: *Deleted

## 2023-07-17 DIAGNOSIS — R918 Other nonspecific abnormal finding of lung field: Secondary | ICD-10-CM | POA: Insufficient documentation

## 2023-07-17 DIAGNOSIS — R911 Solitary pulmonary nodule: Secondary | ICD-10-CM | POA: Diagnosis not present

## 2023-07-26 ENCOUNTER — Telehealth: Payer: Self-pay

## 2023-07-26 NOTE — Telephone Encounter (Signed)
*  Pulm  Pharmacy Patient Advocate Encounter   Received notification from CoverMyMeds that prior authorization for Trelegy Ellipta 100-62.5-25MCG/ACT aerosol powder  is required/requested.   Insurance verification completed.   The patient is insured through [facility] .   Per test claim: PA required; PA started via CoverMyMeds. KEY [id number] . Please see clinical question(s) below that I am not finding the answer to in her chart and advise.   Which TWO preferred inhaled corticosteroid combination medications has the member had an inadequate response to after a therapeutic trial with each?  Advair Diskus (or HFA inhaler) and Dulera inhaler Advair Diskus (or HFA inhaler) and Symbicort  Dulera inhaler and Symbicort inhaler

## 2023-07-26 NOTE — Telephone Encounter (Signed)
 Patient states that she hasn't tried [place] or Symbicort.  Please advise.

## 2023-07-27 NOTE — Telephone Encounter (Signed)
 She has been on Advair previously doubt that Symbicort or Elwin Sleight will work as they are in the same class.  However, we can try Symbicort 160/4.5, 2 inhalations twice a day.

## 2023-07-30 MED ORDER — BUDESONIDE-FORMOTEROL FUMARATE 160-4.5 MCG/ACT IN AERO: 2.0000 | INHALATION_SPRAY | Freq: Two times a day (BID) | RESPIRATORY_TRACT | 12 refills | Status: AC

## 2023-07-30 NOTE — Addendum Note (Signed)
 Addended by: Bonney Leitz on: 07/30/2023 12:32 PM   Modules accepted: Orders

## 2023-07-30 NOTE — Telephone Encounter (Signed)
 I have sent in the Symbicort and notified the patient.  Nothing further needed.

## 2023-08-01 DIAGNOSIS — Z79899 Other long term (current) drug therapy: Secondary | ICD-10-CM | POA: Diagnosis not present

## 2023-08-01 DIAGNOSIS — F900 Attention-deficit hyperactivity disorder, predominantly inattentive type: Secondary | ICD-10-CM | POA: Diagnosis not present

## 2023-08-01 DIAGNOSIS — E119 Type 2 diabetes mellitus without complications: Secondary | ICD-10-CM | POA: Diagnosis not present

## 2023-08-01 DIAGNOSIS — E7800 Pure hypercholesterolemia, unspecified: Secondary | ICD-10-CM | POA: Diagnosis not present

## 2023-08-01 DIAGNOSIS — F32A Depression, unspecified: Secondary | ICD-10-CM | POA: Diagnosis not present

## 2023-08-01 DIAGNOSIS — Z6828 Body mass index (BMI) 28.0-28.9, adult: Secondary | ICD-10-CM | POA: Diagnosis not present

## 2023-08-14 DIAGNOSIS — G4733 Obstructive sleep apnea (adult) (pediatric): Secondary | ICD-10-CM | POA: Diagnosis not present

## 2023-08-30 DIAGNOSIS — E119 Type 2 diabetes mellitus without complications: Secondary | ICD-10-CM | POA: Diagnosis not present

## 2023-08-30 DIAGNOSIS — T753XXA Motion sickness, initial encounter: Secondary | ICD-10-CM | POA: Diagnosis not present

## 2023-08-30 DIAGNOSIS — F900 Attention-deficit hyperactivity disorder, predominantly inattentive type: Secondary | ICD-10-CM | POA: Diagnosis not present

## 2023-08-30 DIAGNOSIS — Z6828 Body mass index (BMI) 28.0-28.9, adult: Secondary | ICD-10-CM | POA: Diagnosis not present

## 2023-08-30 DIAGNOSIS — Z79899 Other long term (current) drug therapy: Secondary | ICD-10-CM | POA: Diagnosis not present

## 2023-08-30 DIAGNOSIS — E7800 Pure hypercholesterolemia, unspecified: Secondary | ICD-10-CM | POA: Diagnosis not present

## 2023-09-04 DIAGNOSIS — Z79899 Other long term (current) drug therapy: Secondary | ICD-10-CM | POA: Diagnosis not present

## 2023-09-13 DIAGNOSIS — G4733 Obstructive sleep apnea (adult) (pediatric): Secondary | ICD-10-CM | POA: Diagnosis not present

## 2023-09-15 ENCOUNTER — Ambulatory Visit: Admission: EM | Admit: 2023-09-15 | Discharge: 2023-09-15 | Disposition: A | Discharge status: Home / Self Care

## 2023-09-15 DIAGNOSIS — J40 Bronchitis, not specified as acute or chronic: Secondary | ICD-10-CM

## 2023-09-15 DIAGNOSIS — J4521 Mild intermittent asthma with (acute) exacerbation: Secondary | ICD-10-CM

## 2023-09-15 DIAGNOSIS — J329 Chronic sinusitis, unspecified: Secondary | ICD-10-CM | POA: Diagnosis not present

## 2023-09-15 LAB — POC COVID19/FLU A&B COMBO
Covid Antigen, POC: NEGATIVE
Influenza A Antigen, POC: NEGATIVE
Influenza B Antigen, POC: NEGATIVE

## 2023-09-15 MED ORDER — PREDNISONE 10 MG (21) PO TBPK: ORAL_TABLET | ORAL | 0 refills | Status: DC

## 2023-09-15 MED ORDER — AMOXICILLIN-POT CLAVULANATE 875-125 MG PO TABS: 1.0000 | ORAL_TABLET | Freq: Two times a day (BID) | ORAL | 0 refills | Status: DC

## 2023-09-15 MED ORDER — ALBUTEROL SULFATE HFA 108 (90 BASE) MCG/ACT IN AERS
2.0000 | INHALATION_SPRAY | Freq: Four times a day (QID) | RESPIRATORY_TRACT | 0 refills | Status: AC | PRN
Start: 2023-09-15 — End: ?

## 2023-09-15 NOTE — Discharge Instructions (Signed)
 You tested negative for COVID and flu.  I am concerned that you have a sinus/bronchitis infection.  Start Augmentin  twice daily for 7 days.  I am also worried that your asthma has been flared.  Start the prednisone  taper as prescribed.  Do not take NSAIDs with this medication including aspirin, ibuprofen /Advil , naproxen /Aleve .  This can raise your blood sugars so please avoid carbohydrates and drink plenty of fluid.  Restart your Symbicort  as prescribed your primary care.  Rinse your mouth following use of this medication.  Use your albuterol  every 4-6 hours as needed.  If you are not feeling better within a few days or if anything worsens please return for reevaluation.

## 2023-09-15 NOTE — ED Triage Notes (Addendum)
 Patient to Urgent Care with complaints of nasal congestion/ shortness of breath and tightness/ wheezing/ subjective fever.   Symptoms started two days ago. Worse yesterday.   Meds: allergy meds/ inhaler/ 20mg  prednisone / Mucinex.    Needs refill of albuterol .

## 2023-09-15 NOTE — ED Provider Notes (Signed)
 Jamie Palmer    CSN: [id number] Arrival date & time: 09/15/23  1124      History   Chief Complaint Chief Complaint  Patient presents with   Wheezing   Nasal Congestion   Cough    HPI Jamie Palmer is a 38 y.o. female.   Patient presents today with a 4-day history of URI symptoms.  She reports congestion, shortness of breath, chest tightness, wheezing, subjective fever, chills.  Denies any chest pain, nausea, vomiting, diarrhea.  Denies any known sick contacts.  She has been taking allergy medication as well as Mucinex without improvement of symptoms.  She does have a history of asthma and has been using her albuterol  more frequently since symptoms began.  She has previously been on a maintenance medication but due to change in her insurance coverage has not been taking this regularly.  She denies any recent antibiotics or steroids.  She does report that she had a few 10 mg tablets of prednisone  from a previous prescription and took these this morning but has not noticed a significant improvement of symptoms.  She is a history of diabetes but is very well-controlled with her last A1c of 5.4%.  She is anxious to feel better she is scheduled to go on a cruise next week.  No concern for pregnancy.    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   Dizziness 06/10/2015   Bradycardia 02/23/2015   Diabetes mellitus during pregnancy in third trimester 11/26/2014   Gestational diabetes 03/29/2012   SVD (spontaneous vaginal delivery) 03/29/2012    Past Surgical History:  Procedure Laterality  Date   APPENDECTOMY     FOOT SURGERY     TEE WITHOUT CARDIOVERSION N/A 02/12/2023   Procedure: TRANSESOPHAGEAL ECHOCARDIOGRAM (TEE);  Surgeon: Constancia Delton, MD;  Location: [facility] ORS;  Service: Cardiovascular;  Laterality: N/A;   TUBAL LIGATION Bilateral 11/27/2014   Procedure: POST PARTUM TUBAL LIGATION;  Surgeon: Cyd Dowse, MD;  Location: [facility];  Service: Gynecology;  Laterality: Bilateral;    OB History     Gravida  3   Para  3   Term  3   Preterm  0   AB  0   Living  3      SAB  0   IAB  0   Ectopic  0   Multiple  0   Live Births  3            Home Medications    Prior to Admission medications   Medication Sig Start Date End Date Taking? Authorizing Provider  amoxicillin -clavulanate (AUGMENTIN ) 875-125 MG tablet Take 1 tablet by mouth every 12 (twelve) hours. 09/15/23  Yes Talayla Doyel K, PA-C  citalopram (CELEXA) 10 MG tablet Take 10 mg by mouth daily. 08/01/23  Yes [provider]  predniSONE  (STERAPRED UNI-PAK 21 TAB) 10 MG (21) TBPK tablet As directed 09/15/23  Yes Cindi Ghazarian K, PA-C  albuterol  (VENTOLIN  HFA) 108 (90 Base) MCG/ACT inhaler Inhale 2 puffs into the lungs every 6 (  six) hours as needed for wheezing or shortness of breath. 09/15/23   Felma Pfefferle K, PA-C  atorvastatin (LIPITOR) 40 MG tablet Take 40 mg by mouth daily. 12/16/22   [provider]  budesonide -formoterol  (SYMBICORT ) 160-4.5 MCG/ACT inhaler Inhale 2 puffs into the lungs in the morning and at bedtime. 07/30/23   Marc Senior, MD  cyclobenzaprine  (FLEXERIL ) 10 MG tablet Take 1 tablet by mouth 3 (three) times daily as needed. 05/14/20   [provider]  gabapentin (NEURONTIN) 100 MG capsule Take 100 mg by mouth as needed.    [provider]  metFORMIN (GLUCOPHAGE) 500 MG tablet Take 500 mg by mouth 2 (two) times daily. 04/10/23   [provider]  MOUNJARO 5 MG/0.5ML Pen Inject 15 mg into the skin once a week. 02/10/23   [provider]  Vitamin D, Ergocalciferol, (DRISDOL) 1.25 MG (50000 UNIT) CAPS capsule Take 50,000 Units by mouth once a week. 12/16/22   [provider]  VYVANSE 20 MG capsule Take 20 mg by mouth daily. 06/07/23   [provider]    Family History Family History  Problem Relation Age of Onset   Cancer Mother        lung   Cancer Father    Heart failure Maternal Aunt    Heart attack Maternal Aunt    Hypertension Maternal Aunt    Diabetes Maternal Aunt    Heart disease Maternal Aunt    Cancer Maternal Grandmother        lung   Cancer Maternal Grandfather        lung   Diabetes Other    Hypertension Other    Heart disease Other    Cancer Other    COPD Other    Asthma Other    Heart attack Other    Obesity Other    Hyperlipidemia Other    Other Neg Hx     Social History Social History   Tobacco Use   Smoking status: Former    Current packs/day: 0.00    Average packs/day: 1 pack/day for 23.7 years (23.7 ttl pk-yrs)    Types: Cigarettes    Start date: 2001    Quit date: 02/12/2023    Years since quitting: 0.5   Smokeless tobacco: Never   Tobacco comments:    Quit smoking in 11/2022  Vaping Use   Vaping status: Every Day   Start date: 02/12/2023   Devices: Nicotine  5%  Substance Use Topics   Alcohol use: Yes    Comment: rarely   Drug use: No    Comment: previous drug addiction 7 years ago     Allergies   Patient has no known allergies.   Review of Systems Review of Systems  Constitutional:  Positive for activity change and chills. Negative for appetite change, fatigue and fever.  HENT:  Positive for congestion and sinus pressure. Negative for sneezing and sore throat.   Respiratory:  Positive for cough and chest tightness. Negative for shortness of breath and wheezing.   Cardiovascular:  Negative for chest pain.  Gastrointestinal:  Negative for abdominal pain, diarrhea, nausea and vomiting.  Neurological:  Negative for dizziness,  light-headedness and headaches.     Physical Exam Triage Vital Signs ED Triage Vitals  Encounter Vitals Group     BP 09/15/23 1150 134/87     Systolic BP Percentile --      Diastolic BP Percentile --      Pulse Rate 09/15/23 1150 94  Resp 09/15/23 1150 18     Temp 09/15/23 1150 98 F (36.7 C)     Temp src --      SpO2 09/15/23 1150 98 %     Weight --      Height --      Head Circumference --      Peak Flow --      Pain Score 09/15/23 1149 0     Pain Loc --      Pain Education --      Exclude from Growth Chart --    No data found.  Updated Vital Signs BP 134/87   Pulse 94   Temp 98 F (36.7 C)   Resp 18   LMP 09/13/2023   SpO2 98%   Visual Acuity Right Eye Distance:   Left Eye Distance:   Bilateral Distance:    Right Eye Near:   Left Eye Near:    Bilateral Near:     Physical Exam Vitals reviewed.  Constitutional:      General: She is awake. She is not in acute distress.    Appearance: Normal appearance. She is well-developed. She is not ill-appearing.     Comments: Very pleasant female stated age in no acute distress sitting comfortably in exam room  HENT:     Head: Normocephalic and atraumatic.     Right Ear: Tympanic membrane, ear canal and external ear normal. Tympanic membrane is not erythematous or bulging.     Left Ear: Tympanic membrane, ear canal and external ear normal. Tympanic membrane is not erythematous or bulging.     Nose: Nose normal.     Right Sinus: No maxillary sinus tenderness or frontal sinus tenderness.     Left Sinus: No maxillary sinus tenderness or frontal sinus tenderness.     Mouth/Throat:     Pharynx: Uvula midline. Postnasal drip present. No oropharyngeal exudate or posterior oropharyngeal erythema.  Cardiovascular:     Rate and Rhythm: Normal rate and regular rhythm.     Heart sounds: Normal heart sounds, S1 normal and S2 normal. No murmur heard. Pulmonary:     Effort: Pulmonary effort is normal.     Breath sounds:  Normal breath sounds. No wheezing, rhonchi or rales.     Comments: Clear to auscultation bilaterally Psychiatric:        Behavior: Behavior is cooperative.      UC Treatments / Results  Labs (all labs ordered are listed, but only abnormal results are displayed) Labs Reviewed  POC COVID19/FLU A&B COMBO    EKG   Radiology No results found.  Procedures Procedures (including critical care time)  Medications Ordered in UC Medications - No data to display  Initial Impression / Assessment and Plan / UC Course  I have reviewed the triage vital signs and the nursing notes.  Pertinent labs & imaging results that were available during my care of the patient were reviewed by me and considered in my medical decision making (see chart for details).     Patient is well-appearing, afebrile, nontoxic, nontachycardic.  Viral testing was negative in clinic today.  Chest x-ray was deferred as oxygen saturation is 98% and no adventitious lung sounds on exam.  Discussed that I am mostly concerned about a viral infection that has exacerbated her asthma and so we will treat with prednisone  taper to cover for both asthma exacerbation and bronchitis.  Discussed that she is not to take NSAIDs with this medication and risk of GI bleeding.  She was concerned because her symptoms have been worsening and she is going to go out of town soon.  She was given a prescription for Augmentin  and we discussed that if she is not improving with the prednisone  within a few days she can start this medication but I do not think it is warranted today.  Recommended over-the-counter medication.  Discussed that if anything worsens or changes she should return for reevaluation.  Strict return precautions given.   Final Clinical Impressions(s) / UC Diagnoses   Final diagnoses:  Sinobronchitis  Mild intermittent asthma with acute exacerbation     Discharge Instructions      You tested negative for COVID and flu.  I am  concerned that you have a sinus/bronchitis infection.  Start Augmentin  twice daily for 7 days.  I am also worried that your asthma has been flared.  Start the prednisone  taper as prescribed.  Do not take NSAIDs with this medication including aspirin, ibuprofen /Advil , naproxen /Aleve .  This can raise your blood sugars so please avoid carbohydrates and drink plenty of fluid.  Restart your Symbicort  as prescribed your primary care.  Rinse your mouth following use of this medication.  Use your albuterol  every 4-6 hours as needed.  If you are not feeling better within a few days or if anything worsens please return for reevaluation.     ED Prescriptions     Medication Sig Dispense Auth. Provider   albuterol  (VENTOLIN  HFA) 108 (90 Base) MCG/ACT inhaler Inhale 2 puffs into the lungs every 6 (six) hours as needed for wheezing or shortness of breath. 18 g Maryalice Pasley K, PA-C   predniSONE  (STERAPRED UNI-PAK 21 TAB) 10 MG (21) TBPK tablet As directed 21 tablet Tej Murdaugh K, PA-C   amoxicillin -clavulanate (AUGMENTIN ) 875-125 MG tablet Take 1 tablet by mouth every 12 (twelve) hours. 14 tablet Shaylan Tutton K, PA-C      PDMP not reviewed this encounter.   Budd Cargo, PA-C 09/15/23 1306

## 2023-10-03 DIAGNOSIS — E7800 Pure hypercholesterolemia, unspecified: Secondary | ICD-10-CM | POA: Diagnosis not present

## 2023-10-03 DIAGNOSIS — Z6828 Body mass index (BMI) 28.0-28.9, adult: Secondary | ICD-10-CM | POA: Diagnosis not present

## 2023-10-03 DIAGNOSIS — R5383 Other fatigue: Secondary | ICD-10-CM | POA: Diagnosis not present

## 2023-10-03 DIAGNOSIS — Z1159 Encounter for screening for other viral diseases: Secondary | ICD-10-CM | POA: Diagnosis not present

## 2023-10-03 DIAGNOSIS — E119 Type 2 diabetes mellitus without complications: Secondary | ICD-10-CM | POA: Diagnosis not present

## 2023-10-03 DIAGNOSIS — E559 Vitamin D deficiency, unspecified: Secondary | ICD-10-CM | POA: Diagnosis not present

## 2023-10-03 DIAGNOSIS — Z0000 Encounter for general adult medical examination without abnormal findings: Secondary | ICD-10-CM | POA: Diagnosis not present

## 2023-10-14 DIAGNOSIS — G4733 Obstructive sleep apnea (adult) (pediatric): Secondary | ICD-10-CM | POA: Diagnosis not present

## 2023-11-06 ENCOUNTER — Ambulatory Visit (INDEPENDENT_AMBULATORY_CARE_PROVIDER_SITE_OTHER): Payer: Medicaid Other | Admitting: Pulmonary Disease

## 2023-11-06 ENCOUNTER — Encounter: Payer: Self-pay | Admitting: Pulmonary Disease

## 2023-11-06 VITALS — BP 120/70 | HR 89 | Temp 98.8°F | Ht 66.0 in | Wt 169.0 lb

## 2023-11-06 DIAGNOSIS — J4540 Moderate persistent asthma, uncomplicated: Secondary | ICD-10-CM | POA: Diagnosis not present

## 2023-11-06 DIAGNOSIS — R918 Other nonspecific abnormal finding of lung field: Secondary | ICD-10-CM

## 2023-11-06 NOTE — Progress Notes (Unsigned)
 Subjective:    Patient ID: Jamie Palmer, female    DOB: [birth year], 38 y.o.   MRN: [id number]  Patient Care Team: [facility] as PCP - General Darliss Rogue, MD as PCP - Cardiology (Cardiology)  Chief Complaint  Patient presents with   Follow-up    No breathing problems.     BACKGROUND/INTERVAL:38 year old former smoker (24 PY) with a history as noted below presents for follow-up of asthmatic bronchitis, lung nodules and mild sleep apnea and upper airway resistance syndrome. She was last seen on 05 July 2023. She had a sleep study ordered by primary care on 11 January 2023. This showed mild sleep apnea with AHI of 5.6/h and mild oxygen desaturation to 86% during the study.  She was started on CPAP at that time due to increased symptom burden.  Patient did not tolerate CPAP and elected to try conservative measures.  Patient has not had any exacerbations in the interval.  Has embarked in weight loss.  HPI Discussed the use of AI scribe software for clinical note transcription with the patient, who gave verbal consent to proceed.  History of Present Illness   Jamie Palmer is a 38 year old female with moderate persistent asthmatic bronchitis mild sleep apnea who presents for follow-up.  Her breathing has improved, and she feels less tired. She continues to use Symbicort , taking 2 puffs twice daily for asthma management. Previously, there were issues with obtaining Trelegy due to insurance requirements, necessitating the use of Symbicort  instead.  She notes that Symbicort  is well-tolerated.  She has lost weight on Mounjaro and feels that this has helped her.  She is sleeping well and the previous symptoms of sleep apnea/airway resistance syndrome have resolved.  Feels refreshed after sleeping.  No daytime sleepiness.  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  There is a family history of cancer, with her mother having had cancer and her father having had blood cancer.  She had CT chest on 17 July 2023 that did not show any growth on the very small multiple lung nodules.  She will be due for follow-up CT in March 2026.  She has remained abstinent of cigarettes and is currently not vaping.    Overall she feels well and looks well.    Review of Systems A 10 point review of systems was performed and it is as noted above otherwise negative.   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   Dizziness 06/10/2015   Bradycardia 02/23/2015   Diabetes mellitus during pregnancy in third trimester 11/26/2014   Gestational diabetes 03/29/2012   SVD (spontaneous vaginal delivery) 03/29/2012    Social History   Tobacco Use   Smoking status: Former    Current packs/day: 0.00    Average packs/day: 1 pack/day for 23.7 years (23.7 ttl pk-yrs)    Types: Cigarettes    Start date: 2001    Quit date: 02/12/2023    Years since quitting: 0.7   Smokeless tobacco: Never   Tobacco comments:    Quit smoking in 11/2022  Substance Use Topics   Alcohol use: Yes    Comment: rarely    No Known Allergies  Current Meds  Medication Sig   albuterol  (VENTOLIN  HFA) 108 (90 Base) MCG/ACT inhaler Inhale  2 puffs into the lungs every 6 (six) hours as needed for wheezing or shortness of breath.   atorvastatin (LIPITOR) 40 MG tablet Take 40 mg by mouth daily.   budesonide -formoterol  (SYMBICORT ) 160-4.5 MCG/ACT inhaler Inhale 2 puffs into the lungs in the morning and at bedtime.   citalopram (CELEXA) 10 MG tablet Take 10 mg by mouth daily.   metFORMIN (GLUCOPHAGE) 500 MG tablet Take 500 mg by mouth 2 (two) times daily.   MOUNJARO 15 MG/0.5ML Pen Inject 15 mg into the skin once a week.   Vitamin D, Ergocalciferol, (DRISDOL) 1.25 MG (50000 UNIT) CAPS capsule  Take 50,000 Units by mouth once a week.   [DISCONTINUED] MOUNJARO 5 MG/0.5ML Pen Inject 15 mg into the skin once a week.    Immunization History  Administered Date(s) Administered   DTaP 01/03/2012   Influenza Whole 02/22/2012   Influenza, Seasonal, Injecte, Preservative Fre 03/01/2023   Influenza,inj,Quad PF,6+ Mos 04/04/2019        Objective:     BP 120/70 (BP Location: Left Arm, Patient Position: Sitting, Cuff Size: Normal)   Pulse 89   Temp 98.8 F (37.1 C) (Oral)   Ht 5' 6 (1.676 m)   Wt 169 lb (76.7 kg)   SpO2 99%   BMI 27.28 kg/m   SpO2: 99 %  GENERAL: Well-developed, well-nourished woman, no acute distress.  Fully ambulatory, no conversational dyspnea. HEAD: Normocephalic, atraumatic.  EYES: Pupils equal, round, reactive to light.  No scleral icterus.  MOUTH: Some teeth in poor repair, oral mucosa moist.  No thrush. NECK: Supple. No thyromegaly. Trachea midline. No JVD.  No adenopathy. PULMONARY: Good air entry bilaterally.  Coarse, otherwise, no adventitious sounds. CARDIOVASCULAR: S1 and S2. Regular rate and rhythm.  No rubs, murmurs or gallops heard. ABDOMEN: No overt focal deficit, no gait disturbance, speech is fluent. MUSCULOSKELETAL: No joint deformity, no clubbing, no edema.  NEUROLOGIC: No overt focal deficit, no gait disturbance, speech is fluent. SKIN: Intact,warm,dry. PSYCH: Mood and behavior normal.       Assessment & Plan:     ICD-10-CM   1. Moderate persistent asthmatic bronchitis without complication  J45.40     2. Lung nodules  R91.8      Discussion:    Moderate persistent asthmatic bronchitis/lung nodules Moderate persistent asthmatic bronchitis is well-managed with Symbicort , improving breathing and energy levels. Previous issues with Trelegy due to insurance coverage, but Symbicort  is an acceptable alternative. Recent scan on March 4th showed tiny nodules, which are not concerning and will be monitored. - Continue Symbicort , 2  puffs twice a day - Continue as needed albuterol  - Schedule follow-up scan in March next year to monitor nodules - Follow up in 4 months      Advised if symptoms do not improve or worsen, to please contact office for sooner follow up or seek emergency care.    I spent 25 minutes of dedicated to the care of this patient on the date of this encounter to include pre-visit review of records, face-to-face time with the patient discussing conditions above, post visit ordering of testing, clinical documentation with the electronic health record, making appropriate referrals as documented, and communicating necessary findings to members of the patients care team.     C. Leita Sanders, MD Advanced Bronchoscopy PCCM [facility]    *This note was generated using voice recognition software/Dragon and/or AI transcription program.  Despite best efforts to proofread, errors can occur which can change the meaning. Any transcriptional errors that result  from this process are unintentional and may not be fully corrected at the time of dictation.

## 2023-11-06 NOTE — Patient Instructions (Signed)
 VISIT SUMMARY:  You had a follow-up appointment today to check on your moderate persistent asthmatic bronchitis. Your breathing has improved, and you feel less tired. You are currently using Symbicort , taking one pump daily, and it is effectively managing your asthma. We discussed the impact of heat on your breathing and your preference to stay indoors in air conditioning. We also reviewed your family history of cancer.  YOUR PLAN:  -MODERATE PERSISTENT ASTHMATIC BRONCHITIS: Moderate persistent asthmatic bronchitis is a long-term condition where the airways in your lungs are inflamed and produce extra mucus, making it hard to breathe. Your condition is well-managed with Symbicort , which you should continue taking at one pump daily. We will monitor the tiny nodules found in your recent scan with a follow-up scan next March. Please schedule a follow-up appointment in 4 months to check on your progress.  INSTRUCTIONS:  Please continue using Symbicort  as prescribed, and schedule a follow-up scan in March next year to monitor the nodules. Additionally, make an appointment to see us  again in 4 months.

## 2023-11-07 ENCOUNTER — Encounter: Payer: Self-pay | Admitting: Pulmonary Disease

## 2023-11-07 ENCOUNTER — Encounter: Payer: Self-pay | Admitting: *Deleted

## 2023-11-10 NOTE — Progress Notes (Deleted)
 Cardiology Office Note    Date:  11/10/2023   ID:  Jamie Palmer, Jamie Palmer 1985/12/05, MRN [id number]  PCP:  [facility]  Cardiologist:  Redell Cave, MD  Electrophysiologist:  None   Chief Complaint: Follow-up  History of Present Illness:   Jamie Palmer is a 38 y.o. 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  She was evaluated by cardiology in 2016 for dizziness.  24-hour Holter that time showed a predominant rhythm of sinus with sinus arrhythmia with an average rate of 79 bpm (range 38 at 6 AM to 125 bpm), 0 pauses of greater than 2.5 seconds, 17 isolated PVCs, and 59 isolated PACs.  Echo in 2016 showed an EF of 60 to 65%, no regional wall motion abnormalities, and trivial MR/TR.  She reestablished care with cardiology in 11/2022 at the request of her PCP.  At that time, she was without symptoms of angina or cardiac decompensation with some occasional shortness of breath noted.  She continued to smoke.  Given murmur noted on exam, she underwent echo in 12/2022 that demonstrated an EF of 60 to 65%, no regional wall motion abnormalities, normal LV diastolic function parameters, normal RV systolic function and ventricular cavity size, mild mitral regurgitation, and at least moderate to severe aortic insufficiency with indeterminate number of aortic valve cusps and no evidence of aortic valve stenosis with recommendation to consider TEE for better visualization of the aortic valve.  Aorta documented to be normal in size and structure.  Subsequent TEE in 01/2023 showed an EF of 60 to 65%, normal RV systolic function and ventricular cavity size, no evidence of left atrial or left atrial appendage thrombus, mild mitral regurgitation, and tricuspid aortic valve with mild to moderate insufficiency.  She followed up in 02/2023 and reported some longstanding episodes of chest  tightness described as pinches.  ETT in 04/2023 was without changes concerning for ischemia, or evidence of arrhythmias.  She achieved 10.1 METs.  ***   Labs independently reviewed: 01/2023 - Hgb 12.1, PLT 267, BUN 14, serum creatinine 0.67, potassium 4.2 09/2019 - albumin 3.8, AST/ALT normal 02/2015 - TSH normal, TC 199, TG 171, HDL 45, LDL 120  Past Medical History:  Diagnosis Date   ADHD (attention deficit hyperactivity disorder)    Anxiety    Asthma    Uses inhaler prn   Bradycardia 02/23/2015   Depression    Diabetes (HCC)    Dizziness 06/10/2015   Gestational diabetes    glyburide   Mitral valve regurgitation    Shingles     Past Surgical History:  Procedure Laterality Date   APPENDECTOMY     FOOT SURGERY     TEE WITHOUT CARDIOVERSION N/A 02/12/2023   Procedure: TRANSESOPHAGEAL ECHOCARDIOGRAM (TEE);  Surgeon: Cave Redell, MD;  Location: [facility] ORS;  Service: Cardiovascular;  Laterality: N/A;   TUBAL LIGATION Bilateral 11/27/2014   Procedure: POST PARTUM TUBAL LIGATION;  Surgeon: Krystal Deaner, MD;  Location: [facility];  Service: Gynecology;  Laterality: Bilateral;    Current Medications: No outpatient medications have been marked as taking for the 11/13/23 encounter (Appointment) with [name], PA-C.    Allergies:   Patient has no known allergies.   Social History   Socioeconomic History   Marital status: Single    Spouse name: Not on file   Number of children: Not on file   Years of education:  Not on file   Highest education level: Not on file  Occupational History   Not on file  Tobacco Use   Smoking status: Former    Current packs/day: 0.00    Average packs/day: 1 pack/day for 23.7 years (23.7 ttl pk-yrs)    Types: Cigarettes    Start date: 2001    Quit date: 02/12/2023    Years since quitting: 0.7   Smokeless tobacco: Never   Tobacco comments:    Quit smoking in 11/2022  Vaping Use   Vaping status: Every Day   Start date: 02/12/2023    Devices: Nicotine  5%  Substance and Sexual Activity   Alcohol use: Yes    Comment: rarely   Drug use: No    Comment: previous drug addiction 7 years ago   Sexual activity: Not Currently    Birth control/protection: Surgical  Other Topics Concern   Not on file  Social History Narrative   Not on file   Social Drivers of Health   Financial Resource Strain: Low Risk  (03/23/2021)   Received from [facility]   Overall Financial Resource Strain (CARDIA)    Difficulty of Paying Living Expenses: Not very hard  Food Insecurity: Not on file  Transportation Needs: Not on file  Physical Activity: Not on file  Stress: No Stress Concern Present (03/23/2021)   Received from [facility] of Occupational Health - Occupational Stress Questionnaire    Feeling of Stress : Only a little  Social Connections: Not on file     Family History:  The patient's family history includes Asthma in an other family member; COPD in an other family member; Cancer in her father, maternal grandfather, maternal grandmother, mother, and another family member; Diabetes in her maternal aunt and another family member; Heart attack in her maternal aunt and another family member; Heart disease in her maternal aunt and another family member; Heart failure in her maternal aunt; Hyperlipidemia in an other family member; Hypertension in her maternal aunt and another family member; Obesity in an other family member. There is no history of Other.  ROS:   12-point review of systems is negative unless otherwise noted in the HPI.   EKGs/Labs/Other Studies Reviewed:    Studies reviewed were summarized above. The additional studies were reviewed today:  ETT 04/18/2023: Regular Bruce protocol Resting EKG showing normal sinus rhythm rate 83 bpm blood pressure 139/82 Exercised for 9 minutes achieving 166 bpm, 90% of maximum predicted heart rate Achieved 10.1 METS, exercise stage III achieved No significant ST  or T wave changes at peak stress or in recovery concerning for ischemia Heart rate recovered down to 114 bpm at 3 minutes in recovery   Final impression Normal exercise treadmill study Target heart rate achieved No EKG changes concerning for ischemia, no significant arrhythmia noted __________  TEE 02/12/2023: 1. Left ventricular ejection fraction, by estimation, is 60 to 65%. The  left ventricle has normal function.   2. Right ventricular systolic function is normal. The right ventricular  size is normal.   3. No left atrial/left atrial appendage thrombus was detected.   4. The mitral valve is normal in structure. Mild mitral valve  regurgitation.   5. The aortic valve is tricuspid. Aortic valve regurgitation is mild to  moderate.  __________  2D echo 12/14/2022: 1. Left ventricular ejection fraction, by estimation, is 60 to 65%. The  left ventricle has normal function. The left ventricle has no regional  wall motion abnormalities.  Left ventricular diastolic parameters were  normal. The average left ventricular  global longitudinal strain is -25.1 %.   2. Right ventricular systolic function is normal. The right ventricular  size is normal.   3. The mitral valve is normal in structure. Mild mitral valve  regurgitation. No evidence of mitral stenosis.   4. The aortic valve has an indeterminant number of cusps. Aortic valve  regurgitation is not well visualized, estimated at least moderate to  severe. Consider TEE for better visualization. No aortic stenosis is  present.   5. The inferior vena cava is normal in size with greater than 50%  respiratory variability, suggesting right atrial pressure of 3 mmHg.  __________   2D echo 03/10/2015: - Left ventricle: The cavity size was normal. Systolic function was    normal. The estimated ejection fraction was in the range of 60%    to 65%. Wall motion was normal; there were no regional wall    motion abnormalities.  - Mitral valve:  There was trivial regurgitation.  - Tricuspid valve: There was trivial regurgitation.  __________   24-hour Holter monitor 02/2015: Quality: Fair.  Baseline artifact. Predominant rhythm: sinus with sinus arrhythmia Average heart rate: 79 bpm Max heart rate: 125 bpm Min heart rate: 38 bpm Pauses >2.5 seconds: 0 17 isolated PVCs 59 isolated PACs   Patient did not submit a symptom diary   EKG:  EKG is ordered today.  The EKG ordered today demonstrates ***  Recent Labs: 02/01/2023: BUN 14; Creatinine, Ser 0.67; Hemoglobin 12.1; Platelets 267; Potassium 4.2; Sodium 139  Recent Lipid Panel    Component Value Date/Time   CHOL 199 02/23/2015 0833   TRIG 171 (H) 02/23/2015 0833   HDL 45 (L) 02/23/2015 0833   CHOLHDL 4.4 02/23/2015 0833   VLDL 34 (H) 02/23/2015 0833   LDLCALC 120 02/23/2015 0833    PHYSICAL EXAM:    VS:  There were no vitals taken for this visit.  BMI: There is no height or weight on file to calculate BMI.  Physical Exam  Wt Readings from Last 3 Encounters:  11/06/23 169 lb (76.7 kg)  07/05/23 179 lb 9.6 oz (81.5 kg)  04/22/23 194 lb 0.1 oz (88 kg)     ASSESSMENT & PLAN:   Aortic valve insufficiency: Mild to moderate on TEE in 01/2023.  Tobacco use:   {Are you ordering a CV Procedure (e.g. stress test, cath, DCCV, TEE, etc)?   Press F2        :[id number]}     Disposition: F/u with Dr. Darliss or an APP in ***.   Medication Adjustments/Labs and Tests Ordered: Current medicines are reviewed at length with the patient today.  Concerns regarding medicines are outlined above. Medication changes, Labs and Tests ordered today are summarized above and listed in the Patient Instructions accessible in Encounters.   Signed, Bernardino Bring, PA-C 11/10/2023 8:45 AM     [facility] [place] [address] [place], [place] [address] [phone]

## 2023-11-13 ENCOUNTER — Ambulatory Visit: Admitting: Physician Assistant

## 2023-11-13 DIAGNOSIS — I351 Nonrheumatic aortic (valve) insufficiency: Secondary | ICD-10-CM

## 2023-11-26 ENCOUNTER — Ambulatory Visit
Admission: RE | Admit: 2023-11-26 | Discharge: 2023-11-26 | Disposition: A | Discharge status: Home / Self Care | Source: Ambulatory Visit | Attending: Emergency Medicine | Admitting: Emergency Medicine

## 2023-11-26 VITALS — BP 117/79 | HR 87 | Temp 98.7°F | Resp 18

## 2023-11-26 DIAGNOSIS — R350 Frequency of micturition: Secondary | ICD-10-CM | POA: Insufficient documentation

## 2023-11-26 LAB — POCT URINALYSIS DIP (MANUAL ENTRY)
Bilirubin, UA: NEGATIVE
Blood, UA: NEGATIVE
Glucose, UA: NEGATIVE mg/dL
Ketones, POC UA: NEGATIVE mg/dL
Leukocytes, UA: NEGATIVE
Nitrite, UA: NEGATIVE
Protein Ur, POC: NEGATIVE mg/dL
Spec Grav, UA: 1.015 (ref 1.010–1.025)
Urobilinogen, UA: 0.2 U/dL
pH, UA: 6.5 (ref 5.0–8.0)

## 2023-11-26 LAB — POCT URINE PREGNANCY: Preg Test, Ur: NEGATIVE

## 2023-11-26 MED ORDER — FLUCONAZOLE 150 MG PO TABS: 150.0000 mg | ORAL_TABLET | ORAL | 0 refills | Status: AC | PRN

## 2023-11-26 NOTE — ED Triage Notes (Signed)
 Patient reports urinary frequency and painful urination and vaginal discharge x 2 weeks.

## 2023-11-26 NOTE — Discharge Instructions (Addendum)
 Today you are being treated prophylactically for yeast.   urinalysis is negative for bladder infection, has been sent to the lab to see if bacteria will grow, you will be notified if this occurs  Urine pregnancy test is negative  Take diflucan  150 mg once, if symptoms still present in 3 days then you may take second pill   Yeast infections which are caused by a naturally occurring fungus called candida. Vaginosis is an inflammation of the vagina that can result in discharge, itching and pain. The cause is usually a change in the normal balance of vaginal bacteria or an infection. Vaginosis can also result from reduced estrogen levels after menopause.  Labs pending 2-3 days, you will be contacted if positive for any sti and treatment will be sent to the pharmacy, you will have to return to the clinic if positive for gonorrhea to receive treatment   Please refrain from having sex until labs results, if positive please refrain from having sex until treatment complete and symptoms resolve   If positive for HIV, Syphilis, Chlamydia  gonorrhea or trichomoniasis please notify partner or partners so they may tested as well  Moving forward, it is recommended you use some form of protection against the transmission of sti infections  such as condoms or dental dams with each sexual encounter    In addition:   Avoid baths, hot tubs and whirlpool spas.  Don't use scented or harsh soaps, such as those with deodorant or antibacterial action. Avoid irritants. These include scented tampons and pads. Wipe from front to back after using the toilet.  Don't douche. Your vagina doesn't require cleansing other than normal bathing.  Use a  condom. Wear cotton underwear, this fabric helps absorb moisture

## 2023-11-26 NOTE — Progress Notes (Unsigned)
 Cardiology Office Note    Date:  11/27/2023   ID:  Jamie Palmer, DOB [birth year], MRN [id number]  PCP:  [facility]  Cardiologist:  Redell Cave, MD  Electrophysiologist:  None   Chief Complaint: Follow-up  History of Present Illness:   Jamie Palmer is a 38 y.o. female with history of mild to moderate aortic valve insufficiency, DM2, asthma, and prior tobacco use who presents for follow-up of aortic insufficiency.  She was evaluated by cardiology in 2016 for dizziness.  24-hour Holter that time showed a predominant rhythm of sinus with sinus arrhythmia with an average rate of 79 bpm (range 38 at 6 AM to 125 bpm), 0 pauses of greater than 2.5 seconds, 17 isolated PVCs, and 59 isolated PACs.  Echo in 2016 showed an EF of 60 to 65%, no regional wall motion abnormalities, and trivial MR/TR.  She reestablished care with cardiology in 11/2022 at the request of her PCP.  At that time, she was without symptoms of angina or cardiac decompensation with some occasional shortness of breath noted.  She continued to smoke.  Given murmur noted on exam, she underwent echo in 12/2022 that demonstrated an EF of 60 to 65%, no regional wall motion abnormalities, normal LV diastolic function parameters, normal RV systolic function and ventricular cavity size, mild mitral regurgitation, and at least moderate to possibly severe aortic insufficiency with indeterminate number of aortic valve cusps and no evidence of aortic valve stenosis with recommendation to consider TEE for better visualization of the aortic valve.  Aorta documented to be normal in size and structure.  LVIDd 4.5 cm.  Subsequent TEE in 01/2023 showed an EF of 60 to 65%, normal RV systolic function and ventricular cavity size, no evidence of left atrial or left atrial appendage thrombus, mild mitral regurgitation, and tricuspid aortic valve with mild to moderate insufficiency.  She followed up in 02/2023 and reported some longstanding  episodes of chest tightness described as pinches.  ETT in 04/2023 was without changes concerning for ischemia, or evidence of arrhythmias.  She achieved 10.1 METs.  She comes in doing well from a cardiac perspective and is without symptoms of angina or cardiac decompensation.  She has been under increased stress lately, though is trying to focus on positive changes.  She has completed her GED and is enrolling in an associates program.  She has also improved her diet and with this has had an intentional weight loss.  She overall feels better.  No dizziness, presyncope, or syncope.   Labs independently reviewed: 01/2023 - Hgb 12.1, PLT 267, BUN 14, serum creatinine 0.67, potassium 4.2 09/2019 - albumin 3.8, AST/ALT normal 02/2015 - TSH normal, TC 199, TG 171, HDL 45, LDL 120  Past Medical History:  Diagnosis Date   ADHD (attention deficit hyperactivity disorder)    Anxiety    Asthma    Uses inhaler prn   Bradycardia 02/23/2015   Depression    Diabetes (HCC)    Dizziness 06/10/2015   Gestational diabetes    glyburide   Mitral valve regurgitation    Shingles     Past Surgical History:  Procedure Laterality Date   APPENDECTOMY     FOOT SURGERY     TEE WITHOUT CARDIOVERSION N/A 02/12/2023   Procedure: TRANSESOPHAGEAL ECHOCARDIOGRAM (TEE);  Surgeon: Cave Redell, MD;  Location: [facility] ORS;  Service: Cardiovascular;  Laterality: N/A;   TUBAL LIGATION Bilateral 11/27/2014   Procedure: POST PARTUM TUBAL LIGATION;  Surgeon: Krystal Deaner, MD;  Location: [facility];  Service: Gynecology;  Laterality: Bilateral;    Current Medications: Current Meds  Medication Sig   albuterol  (VENTOLIN  HFA) 108 (90 Base) MCG/ACT inhaler Inhale 2 puffs into the lungs every 6 (six) hours as needed for wheezing or shortness of breath.   atorvastatin (LIPITOR) 40 MG tablet Take 40 mg by mouth daily.   budesonide -formoterol  (SYMBICORT ) 160-4.5 MCG/ACT inhaler Inhale 2 puffs into the lungs in the morning and at  bedtime.   citalopram (CELEXA) 10 MG tablet Take 10 mg by mouth daily.   fluconazole  (DIFLUCAN ) 150 MG tablet Take 1 tablet (150 mg total) by mouth every three (3) days as needed for up to 2 doses.   MOUNJARO 15 MG/0.5ML Pen Inject 15 mg into the skin once a week.   Vitamin D, Ergocalciferol, (DRISDOL) 1.25 MG (50000 UNIT) CAPS capsule Take 50,000 Units by mouth once a week.    Allergies:   Patient has no known allergies.   Social History   Socioeconomic History   Marital status: Single    Spouse name: Not on file   Number of children: Not on file   Years of education: Not on file   Highest education level: Not on file  Occupational History   Not on file  Tobacco Use   Smoking status: Former    Current packs/day: 0.00    Average packs/day: 1 pack/day for 23.7 years (23.7 ttl pk-yrs)    Types: Cigarettes    Start date: 2001    Quit date: 02/12/2023    Years since quitting: 0.7   Smokeless tobacco: Never   Tobacco comments:    Quit smoking in 11/2022  Vaping Use   Vaping status: Every Day   Start date: 02/12/2023   Devices: Nicotine  5%  Substance and Sexual Activity   Alcohol use: Yes    Comment: rarely   Drug use: No    Comment: previous drug addiction 7 years ago   Sexual activity: Not Currently    Birth control/protection: Surgical  Other Topics Concern   Not on file  Social History Narrative   Not on file   Social Drivers of Health   Financial Resource Strain: Low Risk  (03/23/2021)   Received from [facility]   Overall Financial Resource Strain (CARDIA)    Difficulty of Paying Living Expenses: Not very hard  Food Insecurity: Not on file  Transportation Needs: Not on file  Physical Activity: Not on file  Stress: No Stress Concern Present (03/23/2021)   Received from [facility] of Occupational Health - Occupational Stress Questionnaire    Feeling of Stress : Only a little  Social Connections: Not on file     Family History:  The  patient's family history includes Asthma in an other family member; COPD in an other family member; Cancer in her father, maternal grandfather, maternal grandmother, mother, and another family member; Diabetes in her maternal aunt and another family member; Heart attack in her maternal aunt and another family member; Heart disease in her maternal aunt and another family member; Heart failure in her maternal aunt; Hyperlipidemia in an other family member; Hypertension in her maternal aunt and another family member; Obesity in an other family member. There is no history of Other.  ROS:   12-point review of systems is negative unless otherwise noted in the HPI.   EKGs/Labs/Other Studies Reviewed:    Studies reviewed were summarized above. The additional studies were reviewed today:  ETT 04/18/2023: Regular  Bruce protocol Resting EKG showing normal sinus rhythm rate 83 bpm blood pressure 139/82 Exercised for 9 minutes achieving 166 bpm, 90% of maximum predicted heart rate Achieved 10.1 METS, exercise stage III achieved No significant ST or T wave changes at peak stress or in recovery concerning for ischemia Heart rate recovered down to 114 bpm at 3 minutes in recovery   Final impression Normal exercise treadmill study Target heart rate achieved No EKG changes concerning for ischemia, no significant arrhythmia noted __________  TEE 02/12/2023: 1. Left ventricular ejection fraction, by estimation, is 60 to 65%. The  left ventricle has normal function.   2. Right ventricular systolic function is normal. The right ventricular  size is normal.   3. No left atrial/left atrial appendage thrombus was detected.   4. The mitral valve is normal in structure. Mild mitral valve  regurgitation.   5. The aortic valve is tricuspid. Aortic valve regurgitation is mild to  moderate.  __________  2D echo 12/14/2022: 1. Left ventricular ejection fraction, by estimation, is 60 to 65%. The  left ventricle has  normal function. The left ventricle has no regional  wall motion abnormalities. Left ventricular diastolic parameters were  normal. The average left ventricular  global longitudinal strain is -25.1 %.   2. Right ventricular systolic function is normal. The right ventricular  size is normal.   3. The mitral valve is normal in structure. Mild mitral valve  regurgitation. No evidence of mitral stenosis.   4. The aortic valve has an indeterminant number of cusps. Aortic valve  regurgitation is not well visualized, estimated at least moderate to  severe. Consider TEE for better visualization. No aortic stenosis is  present.   5. The inferior vena cava is normal in size with greater than 50%  respiratory variability, suggesting right atrial pressure of 3 mmHg.  __________   2D echo 03/10/2015: - Left ventricle: The cavity size was normal. Systolic function was    normal. The estimated ejection fraction was in the range of 60%    to 65%. Wall motion was normal; there were no regional wall    motion abnormalities.  - Mitral valve: There was trivial regurgitation.  - Tricuspid valve: There was trivial regurgitation.  __________   24-hour Holter monitor 02/2015: Quality: Fair.  Baseline artifact. Predominant rhythm: sinus with sinus arrhythmia Average heart rate: 79 bpm Max heart rate: 125 bpm Min heart rate: 38 bpm Pauses >2.5 seconds: 0 17 isolated PVCs 59 isolated PACs   Patient did not submit a symptom diary   EKG:  EKG is ordered today.  The EKG ordered today demonstrates sinus tachycardia, 110 bpm, biatrial enlargement, no acute ST-T changes  Recent Labs: 02/01/2023: BUN 14; Creatinine, Ser 0.67; Hemoglobin 12.1; Platelets 267; Potassium 4.2; Sodium 139  Recent Lipid Panel    Component Value Date/Time   CHOL 199 02/23/2015 0833   TRIG 171 (H) 02/23/2015 0833   HDL 45 (L) 02/23/2015 0833   CHOLHDL 4.4 02/23/2015 0833   VLDL 34 (H) 02/23/2015 0833   LDLCALC 120 02/23/2015  0833    PHYSICAL EXAM:    VS:  BP 100/70 (BP Location: Left Arm, Patient Position: Sitting, Cuff Size: Normal)   Pulse (!) 110   Ht 5' 7 (1.702 m)   Wt 163 lb 9.6 oz (74.2 kg)   LMP 09/17/2023 (Approximate)   SpO2 99%   BMI 25.62 kg/m   BMI: Body mass index is 25.62 kg/m.  Physical Exam Vitals reviewed.  Constitutional:  Appearance: She is well-developed.  HENT:     Head: Normocephalic and atraumatic.  Eyes:     General:        Right eye: No discharge.        Left eye: No discharge.  Cardiovascular:     Rate and Rhythm: Normal rate and regular rhythm.     Heart sounds: S1 normal and S2 normal. Heart sounds not distant. No midsystolic click and no opening snap. Murmur heard.     Diastolic murmur is present with a grade of 1/4 at the upper right sternal border.     No friction rub.  Pulmonary:     Effort: Pulmonary effort is normal. No respiratory distress.     Breath sounds: Normal breath sounds. No decreased breath sounds, wheezing, rhonchi or rales.  Chest:     Chest wall: No tenderness.  Musculoskeletal:     Cervical back: Normal range of motion.  Skin:    General: Skin is warm and dry.     Nails: There is no clubbing.  Neurological:     Mental Status: She is alert and oriented to person, place, and time.  Psychiatric:        Speech: Speech normal.        Behavior: Behavior normal.        Thought Content: Thought content normal.        Judgment: Judgment normal.     Wt Readings from Last 3 Encounters:  11/27/23 163 lb 9.6 oz (74.2 kg)  11/06/23 169 lb (76.7 kg)  07/05/23 179 lb 9.6 oz (81.5 kg)     ASSESSMENT & PLAN:   Aortic valve insufficiency: Mild to moderate on TEE in 01/2023 with normal size and structure aortic root.  No evidence of LV dysfunction on transthoracic echo with LVIDd of 4.5 cm.  She is doing well and without symptoms of angina or cardiac decompensation.  Obtain 2D echo to trend aortic insufficiency.  If there is progression of aortic  insufficiency, may need to consider cardiac MRI for further evaluation of the aortic valve.  She is status post tubal ligation.     Disposition: F/u with Dr. Darliss or an APP in 12 months. Sooner if needed.   Medication Adjustments/Labs and Tests Ordered: Current medicines are reviewed at length with the patient today.  Concerns regarding medicines are outlined above. Medication changes, Labs and Tests ordered today are summarized above and listed in the Patient Instructions accessible in Encounters.   Signed, Bernardino Bring, PA-C 11/27/2023 4:34 PM     [facility] [place] [address] [place], [place] [address] [phone]

## 2023-11-26 NOTE — ED Provider Notes (Signed)
 Jamie Palmer    CSN: [id number] Arrival date & time: 11/26/23  1636      History   Chief Complaint Chief Complaint  Patient presents with   Urinary Frequency    Entered by patient    HPI Jamie Palmer is a 38 y.o. female.   Patient presents for evaluation of urinary frequency, dysuria, [name] thick vaginal discharge and vaginal irritation present for 2 weeks.  Had recent sexual encounter with partner, unsure if exposures is her partner has multiple partners.  Recent STI check was negative however symptoms show no improvement.  History of diabetes.  Denies vaginal odor, hematuria, abdominal plate pain or fever.  Has not attempted treatment.  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   Dizziness 06/10/2015   Bradycardia 02/23/2015   Diabetes mellitus during pregnancy in third trimester 11/26/2014   Gestational diabetes 03/29/2012   SVD (spontaneous vaginal delivery) 03/29/2012    Past Surgical History:  Procedure Laterality Date   APPENDECTOMY     FOOT SURGERY     TEE WITHOUT CARDIOVERSION N/A 02/12/2023   Procedure: TRANSESOPHAGEAL ECHOCARDIOGRAM (TEE);  Surgeon: Darliss Rogue, MD;  Location: [facility];  Service: Cardiovascular;  Laterality: N/A;   TUBAL LIGATION Bilateral 11/27/2014   Procedure: POST PARTUM TUBAL LIGATION;  Surgeon: Krystal Deaner, MD;  Location: [facility];  Service: Gynecology;  Laterality: Bilateral;    OB History     Gravida  3   Para  3   Term  3   Preterm  0   AB  0   Living  3      SAB  0   IAB  0   Ectopic  0   Multiple  0    Live Births  3            Home Medications    Prior to Admission medications   Medication Sig Start Date End Date Taking? Authorizing Provider  fluconazole  (DIFLUCAN ) 150 MG tablet Take 1 tablet (150 mg total) by mouth every three (3) days as needed for up to 2 doses. 11/26/23  Yes Julissa Browning, Shelba SAUNDERS, NP  albuterol  (VENTOLIN  HFA) 108 (90 Base) MCG/ACT inhaler Inhale 2 puffs into the lungs every 6 (six) hours as needed for wheezing or shortness of breath. 09/15/23   Raspet, Erin K, PA-C  amoxicillin -clavulanate (AUGMENTIN ) 875-125 MG tablet Take 1 tablet by mouth every 12 (twelve) hours. Patient not taking: Reported on 11/06/2023 09/15/23   Raspet, Erin K, PA-C  atorvastatin (LIPITOR) 40 MG tablet Take 40 mg by mouth daily. 12/16/22   [provider]  budesonide -formoterol  (SYMBICORT ) 160-4.5 MCG/ACT inhaler Inhale 2 puffs into the lungs in the morning and at bedtime. 07/30/23   Tamea Dedra LITTIE, MD  citalopram (CELEXA) 10 MG tablet Take 10 mg by mouth daily. 08/01/23   [provider]  cyclobenzaprine  (FLEXERIL ) 10 MG tablet Take 1 tablet by mouth 3 (three) times daily as needed. Patient not taking: Reported on 11/06/2023 05/14/20   [provider]  gabapentin (NEURONTIN)  100 MG capsule Take 100 mg by mouth as needed. Patient not taking: Reported on 11/06/2023    [provider]  metFORMIN (GLUCOPHAGE) 500 MG tablet Take 500 mg by mouth 2 (two) times daily. 04/10/23   [provider]  MOUNJARO 15 MG/0.5ML Pen Inject 15 mg into the skin once a week. 11/03/23   [provider]  predniSONE  (STERAPRED UNI-PAK 21 TAB) 10 MG (21) TBPK tablet As directed Patient not taking: Reported on 11/06/2023 09/15/23   Raspet, Erin K, PA-C  Vitamin D, Ergocalciferol, (DRISDOL) 1.25 MG (50000 UNIT) CAPS capsule Take 50,000 Units by mouth once a week. 12/16/22   [provider]  VYVANSE 20 MG capsule Take 20 mg by mouth daily. Patient not taking: Reported on  11/06/2023 06/07/23   [provider]    Family History Family History  Problem Relation Age of Onset   Cancer Mother        lung   Cancer Father    Heart failure Maternal Aunt    Heart attack Maternal Aunt    Hypertension Maternal Aunt    Diabetes Maternal Aunt    Heart disease Maternal Aunt    Cancer Maternal Grandmother        lung   Cancer Maternal Grandfather        lung   Diabetes Other    Hypertension Other    Heart disease Other    Cancer Other    COPD Other    Asthma Other    Heart attack Other    Obesity Other    Hyperlipidemia Other    Other Neg Hx     Social History Social History   Tobacco Use   Smoking status: Former    Current packs/day: 0.00    Average packs/day: 1 pack/day for 23.7 years (23.7 ttl pk-yrs)    Types: Cigarettes    Start date: 2001    Quit date: 02/12/2023    Years since quitting: 0.7   Smokeless tobacco: Never   Tobacco comments:    Quit smoking in 11/2022  Vaping Use   Vaping status: Every Day   Start date: 02/12/2023   Devices: Nicotine  5%  Substance Use Topics   Alcohol use: Yes    Comment: rarely   Drug use: No    Comment: previous drug addiction 7 years ago     Allergies   Patient has no known allergies.   Review of Systems Review of Systems   Physical Exam Triage Vital Signs ED Triage Vitals  Encounter Vitals Group     BP 11/26/23 1709 117/79     Girls Systolic BP Percentile --      Girls Diastolic BP Percentile --      Boys Systolic BP Percentile --      Boys Diastolic BP Percentile --      Pulse Rate 11/26/23 1709 87     Resp 11/26/23 1709 18     Temp 11/26/23 1709 98.7 F (37.1 C)     Temp Source 11/26/23 1709 Oral     SpO2 11/26/23 1709 99 %     Weight --      Height --      Head Circumference --      Peak Flow --      Pain Score 11/26/23 1710 6     Pain Loc --      Pain Education --      Exclude from Growth Chart --    No data  found.  Updated Vital Signs BP 117/79 (BP Location:  Right Arm)   Pulse 87   Temp 98.7 F (37.1 C) (Oral)   Resp 18   LMP 09/17/2023 (Approximate)   SpO2 99%   Visual Acuity Right Eye Distance:   Left Eye Distance:   Bilateral Distance:    Right Eye Near:   Left Eye Near:    Bilateral Near:     Physical Exam Constitutional:      Appearance: Normal appearance.  Eyes:     Extraocular Movements: Extraocular movements intact.  Pulmonary:     Effort: Pulmonary effort is normal.  Abdominal:     Tenderness: There is no abdominal tenderness. There is no right CVA tenderness, left CVA tenderness or guarding.  Genitourinary:    Comments: deferred Neurological:     Mental Status: She is alert and oriented to person, place, and time. Mental status is at baseline.      UC Treatments / Results  Labs (all labs ordered are listed, but only abnormal results are displayed) Labs Reviewed  POCT URINALYSIS DIP (MANUAL ENTRY) - Normal  POCT URINE PREGNANCY - Normal  URINE CULTURE  RPR  HIV ANTIBODY (ROUTINE TESTING W REFLEX)  CERVICOVAGINAL ANCILLARY ONLY    EKG   Radiology No results found.  Procedures Procedures (including critical care time)  Medications Ordered in UC Medications - No data to display  Initial Impression / Assessment and Plan / UC Course  I have reviewed the triage vital signs and the nursing notes.  Pertinent labs & imaging results that were available during my care of the patient were reviewed by me and considered in my medical decision making (see chart for details).  Urinary frequency, vaginal discharge  Urinalysis negative, sent for culture, urine pregnancy negative as patient has missed menstrual period, history of tubal ligation, discussed all findings, treating empirically for yeast, Diflucan  sent to pharmacy and discussed administration, STI labs pending, will treat further per protocol, advised abstinence until lab results treatment is complete and all symptoms have resolved, recommended  nonpharmacological supportive care Final Clinical Impressions(s) / UC Diagnoses   Final diagnoses:  Urinary frequency     Discharge Instructions      Today you are being treated prophylactically for yeast.   urinalysis is negative for bladder infection, has been sent to the lab to see if bacteria will grow, you will be notified if this occurs  Urine pregnancy test is negative  Take diflucan  150 mg once, if symptoms still present in 3 days then you may take second pill   Yeast infections which are caused by a naturally occurring fungus called candida. Vaginosis is an inflammation of the vagina that can result in discharge, itching and pain. The cause is usually a change in the normal balance of vaginal bacteria or an infection. Vaginosis can also result from reduced estrogen levels after menopause.  Labs pending 2-3 days, you will be contacted if positive for any sti and treatment will be sent to the pharmacy, you will have to return to the clinic if positive for gonorrhea to receive treatment   Please refrain from having sex until labs results, if positive please refrain from having sex until treatment complete and symptoms resolve   If positive for HIV, Syphilis, Chlamydia  gonorrhea or trichomoniasis please notify partner or partners so they may tested as well  Moving forward, it is recommended you use some form of protection against the transmission of sti infections  such as condoms  or dental dams with each sexual encounter    In addition:   Avoid baths, hot tubs and whirlpool spas.  Don't use scented or harsh soaps, such as those with deodorant or antibacterial action. Avoid irritants. These include scented tampons and pads. Wipe from front to back after using the toilet.  Don't douche. Your vagina doesn't require cleansing other than normal bathing.  Use a  condom. Wear cotton underwear, this fabric helps absorb moisture     ED Prescriptions     Medication Sig  Dispense Auth. Provider   fluconazole  (DIFLUCAN ) 150 MG tablet Take 1 tablet (150 mg total) by mouth every three (3) days as needed for up to 2 doses. 2 tablet Caileb Rhue R, NP      PDMP not reviewed this encounter.   Teresa Shelba SAUNDERS, NP 11/26/23 1743

## 2023-11-27 ENCOUNTER — Ambulatory Visit (HOSPITAL_COMMUNITY): Payer: Self-pay

## 2023-11-27 ENCOUNTER — Ambulatory Visit: Attending: Physician Assistant | Admitting: Physician Assistant

## 2023-11-27 ENCOUNTER — Encounter: Payer: Self-pay | Admitting: Physician Assistant

## 2023-11-27 VITALS — BP 100/70 | HR 110 | Ht 67.0 in | Wt 163.6 lb

## 2023-11-27 DIAGNOSIS — I351 Nonrheumatic aortic (valve) insufficiency: Secondary | ICD-10-CM | POA: Insufficient documentation

## 2023-11-27 LAB — CERVICOVAGINAL ANCILLARY ONLY
Bacterial Vaginitis (gardnerella): POSITIVE — AB
Candida Glabrata: NEGATIVE
Candida Vaginitis: NEGATIVE
Chlamydia: NEGATIVE
Comment: NEGATIVE
Comment: NEGATIVE
Comment: NEGATIVE
Comment: NEGATIVE
Comment: NEGATIVE
Comment: NORMAL
Neisseria Gonorrhea: NEGATIVE
Trichomonas: NEGATIVE

## 2023-11-27 LAB — RPR: RPR Ser Ql: NONREACTIVE

## 2023-11-27 LAB — URINE CULTURE: Culture: NO GROWTH

## 2023-11-27 LAB — HIV ANTIBODY (ROUTINE TESTING W REFLEX): HIV Screen 4th Generation wRfx: NONREACTIVE

## 2023-11-27 MED ORDER — METRONIDAZOLE 500 MG PO TABS
500.0000 mg | ORAL_TABLET | Freq: Two times a day (BID) | ORAL | 0 refills | Status: AC
Start: 2023-11-27 — End: 2023-12-04

## 2023-11-27 NOTE — Patient Instructions (Signed)
 Medication Instructions:  Your physician recommends that you continue on your current medications as directed. Please refer to the Current Medication list given to you today.   *If you need a refill on your cardiac medications before your next appointment, please call your pharmacy*  Lab Work: None ordered at this time  If you have labs (blood work) drawn today and your tests are completely normal, you will receive your results only by: MyChart Message (if you have MyChart) OR A paper copy in the mail If you have any lab test that is abnormal or we need to change your treatment, we will call you to review the results.  Testing/Procedures: Your physician has requested that you have an echocardiogram. Echocardiography is a painless test that uses sound waves to create images of your heart. It provides your doctor with information about the size and shape of your heart and how well your heart's chambers and valves are working.   You may receive an ultrasound enhancing agent through an IV if needed to better visualize your heart during the echo. This procedure takes approximately one hour.  There are no restrictions for this procedure.  This will take place at [address] [facility] [address], [place] [address]  Please note: We ask at that you not bring children with you during ultrasound (echo/ vascular) testing. Due to room size and safety concerns, children are not allowed in the ultrasound rooms during exams. Our front office staff cannot provide observation of children in our lobby area while testing is being conducted. An adult accompanying a patient to their appointment will only be allowed in the ultrasound room at the discretion of the ultrasound technician under special circumstances. We apologize for any inconvenience.   Follow-Up: At [facility], you and your health needs are our priority.  As part of our continuing mission to provide you with exceptional  heart care, our providers are all part of one team.  This team includes your primary Cardiologist (physician) and Advanced Practice Providers or APPs (Physician Assistants and Nurse Practitioners) who all work together to provide you with the care you need, when you need it.  Your next appointment:   12 month(s)  Provider:   You may see Redell Cave, MD or Bernardino Bring, PA-C

## 2023-12-05 DIAGNOSIS — M79605 Pain in left leg: Secondary | ICD-10-CM | POA: Diagnosis not present

## 2023-12-05 DIAGNOSIS — M545 Low back pain, unspecified: Secondary | ICD-10-CM | POA: Diagnosis not present

## 2023-12-05 DIAGNOSIS — Z6826 Body mass index (BMI) 26.0-26.9, adult: Secondary | ICD-10-CM | POA: Diagnosis not present

## 2023-12-09 DIAGNOSIS — Z01419 Encounter for gynecological examination (general) (routine) without abnormal findings: Secondary | ICD-10-CM | POA: Diagnosis not present

## 2023-12-09 DIAGNOSIS — E119 Type 2 diabetes mellitus without complications: Secondary | ICD-10-CM | POA: Diagnosis not present

## 2023-12-09 DIAGNOSIS — M79605 Pain in left leg: Secondary | ICD-10-CM | POA: Diagnosis not present

## 2023-12-09 DIAGNOSIS — Z0000 Encounter for general adult medical examination without abnormal findings: Secondary | ICD-10-CM | POA: Diagnosis not present

## 2023-12-09 DIAGNOSIS — M5416 Radiculopathy, lumbar region: Secondary | ICD-10-CM | POA: Diagnosis not present

## 2023-12-09 DIAGNOSIS — Z79899 Other long term (current) drug therapy: Secondary | ICD-10-CM | POA: Diagnosis not present

## 2023-12-09 DIAGNOSIS — Z6826 Body mass index (BMI) 26.0-26.9, adult: Secondary | ICD-10-CM | POA: Diagnosis not present

## 2023-12-09 DIAGNOSIS — M545 Low back pain, unspecified: Secondary | ICD-10-CM | POA: Diagnosis not present

## 2023-12-24 DIAGNOSIS — Z79899 Other long term (current) drug therapy: Secondary | ICD-10-CM | POA: Diagnosis not present

## 2023-12-24 DIAGNOSIS — M545 Low back pain, unspecified: Secondary | ICD-10-CM | POA: Diagnosis not present

## 2023-12-24 DIAGNOSIS — E559 Vitamin D deficiency, unspecified: Secondary | ICD-10-CM | POA: Diagnosis not present

## 2023-12-24 DIAGNOSIS — F32A Depression, unspecified: Secondary | ICD-10-CM | POA: Diagnosis not present

## 2023-12-24 DIAGNOSIS — E119 Type 2 diabetes mellitus without complications: Secondary | ICD-10-CM | POA: Diagnosis not present

## 2023-12-24 DIAGNOSIS — M79605 Pain in left leg: Secondary | ICD-10-CM | POA: Diagnosis not present

## 2023-12-24 DIAGNOSIS — E7800 Pure hypercholesterolemia, unspecified: Secondary | ICD-10-CM | POA: Diagnosis not present

## 2023-12-24 DIAGNOSIS — M47816 Spondylosis without myelopathy or radiculopathy, lumbar region: Secondary | ICD-10-CM | POA: Diagnosis not present

## 2023-12-27 DIAGNOSIS — Z79899 Other long term (current) drug therapy: Secondary | ICD-10-CM | POA: Diagnosis not present

## 2024-01-08 ENCOUNTER — Other Ambulatory Visit

## 2024-01-25 ENCOUNTER — Other Ambulatory Visit

## 2024-01-31 DIAGNOSIS — M79605 Pain in left leg: Secondary | ICD-10-CM | POA: Diagnosis not present

## 2024-01-31 DIAGNOSIS — M545 Low back pain, unspecified: Secondary | ICD-10-CM | POA: Diagnosis not present

## 2024-01-31 DIAGNOSIS — Z79899 Other long term (current) drug therapy: Secondary | ICD-10-CM | POA: Diagnosis not present

## 2024-01-31 DIAGNOSIS — M47816 Spondylosis without myelopathy or radiculopathy, lumbar region: Secondary | ICD-10-CM | POA: Diagnosis not present

## 2024-02-04 DIAGNOSIS — Z79899 Other long term (current) drug therapy: Secondary | ICD-10-CM | POA: Diagnosis not present

## 2024-02-18 DIAGNOSIS — M5459 Other low back pain: Secondary | ICD-10-CM | POA: Diagnosis not present

## 2024-03-01 DIAGNOSIS — M545 Low back pain, unspecified: Secondary | ICD-10-CM | POA: Diagnosis not present

## 2024-03-01 DIAGNOSIS — M79605 Pain in left leg: Secondary | ICD-10-CM | POA: Diagnosis not present

## 2024-03-01 DIAGNOSIS — M47816 Spondylosis without myelopathy or radiculopathy, lumbar region: Secondary | ICD-10-CM | POA: Diagnosis not present

## 2024-03-03 ENCOUNTER — Ambulatory Visit: Attending: Physician Assistant

## 2024-03-03 ENCOUNTER — Other Ambulatory Visit: Payer: Self-pay | Admitting: Physician Assistant

## 2024-03-03 DIAGNOSIS — I351 Nonrheumatic aortic (valve) insufficiency: Secondary | ICD-10-CM

## 2024-03-04 LAB — ECHOCARDIOGRAM COMPLETE
AR max vel: 2.12 cm2
AV Area VTI: 2.12 cm2
AV Area mean vel: 2.06 cm2
AV Mean grad: 7 mmHg
AV Peak grad: 13.1 mmHg
Ao pk vel: 1.81 m/s
Area-P 1/2: 3.02 cm2
P 1/2 time: 507 ms
S' Lateral: 2.8 cm

## 2024-03-05 ENCOUNTER — Ambulatory Visit: Payer: Self-pay | Admitting: Physician Assistant

## 2024-03-07 DIAGNOSIS — M5459 Other low back pain: Secondary | ICD-10-CM | POA: Diagnosis not present

## 2024-03-14 DIAGNOSIS — M5459 Other low back pain: Secondary | ICD-10-CM | POA: Diagnosis not present

## 2024-03-19 DIAGNOSIS — M5459 Other low back pain: Secondary | ICD-10-CM | POA: Diagnosis not present

## 2024-03-25 DIAGNOSIS — E119 Type 2 diabetes mellitus without complications: Secondary | ICD-10-CM | POA: Diagnosis not present

## 2024-03-25 DIAGNOSIS — M545 Low back pain, unspecified: Secondary | ICD-10-CM | POA: Diagnosis not present

## 2024-03-25 DIAGNOSIS — F32A Depression, unspecified: Secondary | ICD-10-CM | POA: Diagnosis not present

## 2024-03-25 DIAGNOSIS — M47816 Spondylosis without myelopathy or radiculopathy, lumbar region: Secondary | ICD-10-CM | POA: Diagnosis not present

## 2024-03-25 DIAGNOSIS — E7800 Pure hypercholesterolemia, unspecified: Secondary | ICD-10-CM | POA: Diagnosis not present

## 2024-03-25 DIAGNOSIS — Z79899 Other long term (current) drug therapy: Secondary | ICD-10-CM | POA: Diagnosis not present

## 2024-03-25 DIAGNOSIS — M79605 Pain in left leg: Secondary | ICD-10-CM | POA: Diagnosis not present

## 2024-03-25 DIAGNOSIS — E559 Vitamin D deficiency, unspecified: Secondary | ICD-10-CM | POA: Diagnosis not present

## 2024-03-31 DIAGNOSIS — Z79899 Other long term (current) drug therapy: Secondary | ICD-10-CM | POA: Diagnosis not present

## 8387-01-14 DEATH — deceased
